# Patient Record
Sex: Female | Born: 1940 | Race: White | Hispanic: No | Marital: Married | Smoking: Former smoker
Health system: Southern US, Community
[De-identification: ages and names within clinical notes are randomized; demographics above are authoritative.]

---

## 1998-06-28 ENCOUNTER — Ambulatory Visit (HOSPITAL_BASED_OUTPATIENT_CLINIC_OR_DEPARTMENT_OTHER): Admission: RE | Admit: 1998-06-28 | Discharge: 1998-06-28 | Payer: Self-pay | Admitting: Plastic Surgery

## 1998-09-01 ENCOUNTER — Other Ambulatory Visit: Admission: RE | Admit: 1998-09-01 | Discharge: 1998-09-01 | Payer: Self-pay | Admitting: Internal Medicine

## 1999-04-07 ENCOUNTER — Ambulatory Visit (HOSPITAL_COMMUNITY): Admission: RE | Admit: 1999-04-07 | Discharge: 1999-04-07 | Payer: Self-pay | Admitting: *Deleted

## 2001-11-14 ENCOUNTER — Other Ambulatory Visit: Admission: RE | Admit: 2001-11-14 | Discharge: 2001-11-14 | Payer: Self-pay | Admitting: Internal Medicine

## 2004-09-08 ENCOUNTER — Ambulatory Visit (HOSPITAL_COMMUNITY): Admission: RE | Admit: 2004-09-08 | Discharge: 2004-09-08 | Payer: Self-pay | Admitting: Plastic Surgery

## 2004-09-08 ENCOUNTER — Encounter (INDEPENDENT_AMBULATORY_CARE_PROVIDER_SITE_OTHER): Payer: Self-pay | Admitting: *Deleted

## 2004-09-08 ENCOUNTER — Ambulatory Visit (HOSPITAL_BASED_OUTPATIENT_CLINIC_OR_DEPARTMENT_OTHER): Admission: RE | Admit: 2004-09-08 | Discharge: 2004-09-08 | Payer: Self-pay | Admitting: Plastic Surgery

## 2004-12-08 ENCOUNTER — Other Ambulatory Visit: Admission: RE | Admit: 2004-12-08 | Discharge: 2004-12-08 | Payer: Self-pay | Admitting: Internal Medicine

## 2007-10-17 ENCOUNTER — Other Ambulatory Visit: Admission: RE | Admit: 2007-10-17 | Discharge: 2007-10-17 | Payer: Self-pay | Admitting: Internal Medicine

## 2007-12-09 ENCOUNTER — Ambulatory Visit (HOSPITAL_COMMUNITY): Admission: RE | Admit: 2007-12-09 | Discharge: 2007-12-09 | Payer: Self-pay | Admitting: *Deleted

## 2007-12-09 ENCOUNTER — Encounter (INDEPENDENT_AMBULATORY_CARE_PROVIDER_SITE_OTHER): Payer: Self-pay | Admitting: *Deleted

## 2010-02-15 ENCOUNTER — Other Ambulatory Visit: Admission: RE | Admit: 2010-02-15 | Discharge: 2010-02-15 | Payer: Self-pay | Admitting: Internal Medicine

## 2011-03-21 NOTE — Op Note (Signed)
Molly Manning, Molly Manning               ACCOUNT NO.:  [id number]   MEDICAL RECORD NO.:  [id number]          PATIENT TYPE:  AMB   LOCATION:  ENDO                         FACILITY:  [facility]   PHYSICIAN:  Georgiana Spinner, M.D.    DATE OF BIRTH:  12-26-1940   DATE OF PROCEDURE:  DATE OF DISCHARGE:                               OPERATIVE REPORT   PROCEDURE:  Colonoscopy with biopsy.   INDICATIONS:  Colon polyps.   ANESTHESIA:  Demerol 100, Versed 4 mg.   DESCRIPTION OF PROCEDURE:  With the patient mildly sedated in the left  lateral decubitus position, the Pentax videoscopic colonoscope was  inserted in the rectum, passed under direct vision with pressure applied  to reach the cecum, identified by the ileocecal valve and appendiceal  orifice both of which were photographed. After clearing the cecum with  washing and suctioning some fecal material, the colonoscope was then  slowly withdrawn taking circumferential views of the colonic mucosa  stopping at approximately 20 cm from the anal verge at which point a  small polyp was seen, photographed and removed using hot biopsy forceps  technique setting of 20/150 blended current.  We next stopped in the  rectum which appeared normal on direct and showed small polyps on  retroflexed view that were removed using again hot biopsy forceps  technique with the same setting.  The endoscope was straightened and  withdrawn.  The patient's vital signs and pulse oximeter remained  stable.  The patient tolerated the procedure well without apparent  complications.   FINDINGS:  Polyps as described above at 20 cm from the anal verge near  the anal verge, await biopsy reports.  The patient will call me for  results and follow-up with me as needed as an outpatient.           ______________________________  Georgiana Spinner, M.D.     GMO/MEDQ  D:  12/09/2007  T:  12/09/2007  Job:  [id number]

## 2011-03-24 NOTE — Op Note (Signed)
NAMEPECOLA, HAXTON               ACCOUNT NO.:  [id number]   MEDICAL RECORD NO.:  [id number]          PATIENT TYPE:  AMB   LOCATION:  DSC                          FACILITY:  [facility]   PHYSICIAN:  Alfredia Ferguson, M.D.  DATE OF BIRTH:  [birth year]   DATE OF PROCEDURE:  09/08/2004  DATE OF DISCHARGE:                                 OPERATIVE REPORT   PREOPERATIVE DIAGNOSIS:  1.  Scaly lesion of the left posterior thigh, 6 mm.  2.  Scaly lesion of upper forehead, 6 mm.  3.  Irritated seborrheic keratosis, left upper arm.   POSTOPERATIVE DIAGNOSIS:  1.  Scaly lesion of the left posterior thigh, 6 mm.  2.  Scaly lesion of upper forehead, 6 mm.  3.  Irritated seborrheic keratosis, left upper arm.   OPERATION PERFORMED:  1.  Excision of scaly lesion, left eye.  2.  Excision scaly lesion upper forehead.  3.  Hyfrecation and curetting of scaly lesion left upper arm.   SURGEON:  Alfredia Ferguson, M.D.   ANESTHESIA:  2% Xylocaine with 1:100,000 epinephrine.   INDICATIONS FOR PROCEDURE:  This is a 70 year old woman who has lesions  which have been growing on her face, thigh, and arm.  The lesion on the leg  has been bleeding frequently.  She has a history of skin cancer and she  wishes to have all three lesions removed.  The patient understands what she  has for permanent and potentially unsightly scar.  In spite of that, she  wishes to proceed.   DESCRIPTION OF PROCEDURE:  Elliptical skin marks were placed around the  lesion on the forehead and the lesion on the left thigh.  The lesion on the  arm was simply anesthetized with 2% Xylocaine with 1:100,000 epinephrine as  were the other two lesions.  After waiting approximately five minutes, all  areas were prepped with Betadine and draped with sterile drapes.  The  forehead lesion was excised in an elliptical fashion and passed off for  pathology review.  Hemostasis was accomplished using pressure.  The wound  edges were undermined for a  distance of several mm in all directions.  The  forehead wound was closed with multiple interrupted 6-0 nylon sutures.  A  light dressing was applied.  The lesion on the left arm was now hyfrecated  and curetted.  The base was then hyfrecated to create a light char.  The  lesion on the left posterior thigh was prepped with Betadine and draped with  sterile drapes.  An elliptical excision of the lesion was carried out down  to the level of the subcutaneous tissue.  The  specimen was passed off for pathology.  The dermis was closed with  interrupted 5-0 Monocryl suture.  The skin edges were closed with Steri-  Strips.  The area was cleansed and dried, and a light dressing was applied.  The patient tolerated the procedure well and was discharged to home in  satisfactory condition.       WBB/MEDQ  D:  09/08/2004  T:  09/08/2004  Job:  [id number]

## 2011-11-30 DIAGNOSIS — Z1231 Encounter for screening mammogram for malignant neoplasm of breast: Secondary | ICD-10-CM | POA: Diagnosis not present

## 2011-12-01 DIAGNOSIS — I872 Venous insufficiency (chronic) (peripheral): Secondary | ICD-10-CM | POA: Diagnosis not present

## 2011-12-04 DIAGNOSIS — I8310 Varicose veins of unspecified lower extremity with inflammation: Secondary | ICD-10-CM | POA: Diagnosis not present

## 2011-12-13 DIAGNOSIS — I8310 Varicose veins of unspecified lower extremity with inflammation: Secondary | ICD-10-CM | POA: Diagnosis not present

## 2012-01-09 DIAGNOSIS — I8310 Varicose veins of unspecified lower extremity with inflammation: Secondary | ICD-10-CM | POA: Diagnosis not present

## 2012-01-12 DIAGNOSIS — I8310 Varicose veins of unspecified lower extremity with inflammation: Secondary | ICD-10-CM | POA: Diagnosis not present

## 2012-01-12 DIAGNOSIS — M79609 Pain in unspecified limb: Secondary | ICD-10-CM | POA: Diagnosis not present

## 2012-01-23 DIAGNOSIS — M79609 Pain in unspecified limb: Secondary | ICD-10-CM | POA: Diagnosis not present

## 2012-01-23 DIAGNOSIS — I8310 Varicose veins of unspecified lower extremity with inflammation: Secondary | ICD-10-CM | POA: Diagnosis not present

## 2012-02-06 DIAGNOSIS — M79609 Pain in unspecified limb: Secondary | ICD-10-CM | POA: Diagnosis not present

## 2012-02-06 DIAGNOSIS — M7981 Nontraumatic hematoma of soft tissue: Secondary | ICD-10-CM | POA: Diagnosis not present

## 2012-02-06 DIAGNOSIS — I8310 Varicose veins of unspecified lower extremity with inflammation: Secondary | ICD-10-CM | POA: Diagnosis not present

## 2012-02-20 DIAGNOSIS — I8310 Varicose veins of unspecified lower extremity with inflammation: Secondary | ICD-10-CM | POA: Diagnosis not present

## 2012-02-20 DIAGNOSIS — M79609 Pain in unspecified limb: Secondary | ICD-10-CM | POA: Diagnosis not present

## 2012-02-28 DIAGNOSIS — Z79899 Other long term (current) drug therapy: Secondary | ICD-10-CM | POA: Diagnosis not present

## 2012-02-28 DIAGNOSIS — E7800 Pure hypercholesterolemia, unspecified: Secondary | ICD-10-CM | POA: Diagnosis not present

## 2012-02-28 DIAGNOSIS — Z7902 Long term (current) use of antithrombotics/antiplatelets: Secondary | ICD-10-CM | POA: Diagnosis not present

## 2012-03-05 DIAGNOSIS — L570 Actinic keratosis: Secondary | ICD-10-CM | POA: Diagnosis not present

## 2012-03-05 DIAGNOSIS — D485 Neoplasm of uncertain behavior of skin: Secondary | ICD-10-CM | POA: Diagnosis not present

## 2012-03-06 DIAGNOSIS — E7800 Pure hypercholesterolemia, unspecified: Secondary | ICD-10-CM | POA: Diagnosis not present

## 2012-03-06 DIAGNOSIS — H9319 Tinnitus, unspecified ear: Secondary | ICD-10-CM | POA: Diagnosis not present

## 2012-04-10 DIAGNOSIS — I8310 Varicose veins of unspecified lower extremity with inflammation: Secondary | ICD-10-CM | POA: Diagnosis not present

## 2012-04-10 DIAGNOSIS — M79609 Pain in unspecified limb: Secondary | ICD-10-CM | POA: Diagnosis not present

## 2012-07-30 ENCOUNTER — Ambulatory Visit (INDEPENDENT_AMBULATORY_CARE_PROVIDER_SITE_OTHER): Payer: Medicare Other | Admitting: *Deleted

## 2012-07-30 DIAGNOSIS — Z23 Encounter for immunization: Secondary | ICD-10-CM | POA: Diagnosis not present

## 2012-07-31 DIAGNOSIS — M79609 Pain in unspecified limb: Secondary | ICD-10-CM | POA: Diagnosis not present

## 2012-08-16 DIAGNOSIS — I872 Venous insufficiency (chronic) (peripheral): Secondary | ICD-10-CM | POA: Diagnosis not present

## 2012-09-05 DIAGNOSIS — E7800 Pure hypercholesterolemia, unspecified: Secondary | ICD-10-CM | POA: Diagnosis not present

## 2012-09-05 DIAGNOSIS — Z7902 Long term (current) use of antithrombotics/antiplatelets: Secondary | ICD-10-CM | POA: Diagnosis not present

## 2012-09-05 DIAGNOSIS — Z79899 Other long term (current) drug therapy: Secondary | ICD-10-CM | POA: Diagnosis not present

## 2012-09-11 ENCOUNTER — Other Ambulatory Visit: Payer: Self-pay | Admitting: Registered Nurse

## 2012-09-11 ENCOUNTER — Other Ambulatory Visit (HOSPITAL_COMMUNITY)
Admission: RE | Admit: 2012-09-11 | Discharge: 2012-09-11 | Disposition: A | Discharge status: Home / Self Care | Payer: Medicare Other | Source: Ambulatory Visit | Attending: Endocrinology | Admitting: Endocrinology

## 2012-09-11 DIAGNOSIS — Z1212 Encounter for screening for malignant neoplasm of rectum: Secondary | ICD-10-CM | POA: Diagnosis not present

## 2012-09-11 DIAGNOSIS — Z124 Encounter for screening for malignant neoplasm of cervix: Secondary | ICD-10-CM | POA: Diagnosis not present

## 2012-09-11 DIAGNOSIS — Z01419 Encounter for gynecological examination (general) (routine) without abnormal findings: Secondary | ICD-10-CM | POA: Diagnosis not present

## 2012-09-11 DIAGNOSIS — E7800 Pure hypercholesterolemia, unspecified: Secondary | ICD-10-CM | POA: Diagnosis not present

## 2012-11-19 DIAGNOSIS — I872 Venous insufficiency (chronic) (peripheral): Secondary | ICD-10-CM | POA: Diagnosis not present

## 2012-12-02 DIAGNOSIS — Z1231 Encounter for screening mammogram for malignant neoplasm of breast: Secondary | ICD-10-CM | POA: Diagnosis not present

## 2012-12-04 DIAGNOSIS — I872 Venous insufficiency (chronic) (peripheral): Secondary | ICD-10-CM | POA: Diagnosis not present

## 2013-03-13 DIAGNOSIS — Z79899 Other long term (current) drug therapy: Secondary | ICD-10-CM | POA: Diagnosis not present

## 2013-03-13 DIAGNOSIS — E7800 Pure hypercholesterolemia, unspecified: Secondary | ICD-10-CM | POA: Diagnosis not present

## 2013-03-13 DIAGNOSIS — Z0000 Encounter for general adult medical examination without abnormal findings: Secondary | ICD-10-CM | POA: Diagnosis not present

## 2013-03-18 DIAGNOSIS — Z87891 Personal history of nicotine dependence: Secondary | ICD-10-CM | POA: Diagnosis not present

## 2013-03-18 DIAGNOSIS — E7800 Pure hypercholesterolemia, unspecified: Secondary | ICD-10-CM | POA: Diagnosis not present

## 2013-03-18 DIAGNOSIS — Z800 Family history of malignant neoplasm of digestive organs: Secondary | ICD-10-CM | POA: Diagnosis not present

## 2013-03-18 DIAGNOSIS — Z7982 Long term (current) use of aspirin: Secondary | ICD-10-CM | POA: Diagnosis not present

## 2013-06-10 DIAGNOSIS — H43819 Vitreous degeneration, unspecified eye: Secondary | ICD-10-CM | POA: Diagnosis not present

## 2013-07-18 ENCOUNTER — Ambulatory Visit (INDEPENDENT_AMBULATORY_CARE_PROVIDER_SITE_OTHER): Payer: Medicare Other

## 2013-07-18 DIAGNOSIS — Z23 Encounter for immunization: Secondary | ICD-10-CM

## 2013-07-22 DIAGNOSIS — I8310 Varicose veins of unspecified lower extremity with inflammation: Secondary | ICD-10-CM | POA: Diagnosis not present

## 2013-07-22 DIAGNOSIS — M79609 Pain in unspecified limb: Secondary | ICD-10-CM | POA: Diagnosis not present

## 2013-08-28 DIAGNOSIS — E2839 Other primary ovarian failure: Secondary | ICD-10-CM | POA: Diagnosis not present

## 2013-12-03 DIAGNOSIS — Z1231 Encounter for screening mammogram for malignant neoplasm of breast: Secondary | ICD-10-CM | POA: Diagnosis not present

## 2014-03-26 DIAGNOSIS — Z7982 Long term (current) use of aspirin: Secondary | ICD-10-CM | POA: Diagnosis not present

## 2014-03-26 DIAGNOSIS — Z8249 Family history of ischemic heart disease and other diseases of the circulatory system: Secondary | ICD-10-CM | POA: Diagnosis not present

## 2014-03-26 DIAGNOSIS — T887XXA Unspecified adverse effect of drug or medicament, initial encounter: Secondary | ICD-10-CM | POA: Diagnosis not present

## 2014-03-26 DIAGNOSIS — Z23 Encounter for immunization: Secondary | ICD-10-CM | POA: Diagnosis not present

## 2014-03-26 DIAGNOSIS — E7800 Pure hypercholesterolemia, unspecified: Secondary | ICD-10-CM | POA: Diagnosis not present

## 2014-03-26 DIAGNOSIS — Z0000 Encounter for general adult medical examination without abnormal findings: Secondary | ICD-10-CM | POA: Diagnosis not present

## 2014-03-31 DIAGNOSIS — H9319 Tinnitus, unspecified ear: Secondary | ICD-10-CM | POA: Diagnosis not present

## 2014-03-31 DIAGNOSIS — I6529 Occlusion and stenosis of unspecified carotid artery: Secondary | ICD-10-CM | POA: Diagnosis not present

## 2014-03-31 DIAGNOSIS — Z7982 Long term (current) use of aspirin: Secondary | ICD-10-CM | POA: Diagnosis not present

## 2014-03-31 DIAGNOSIS — E7800 Pure hypercholesterolemia, unspecified: Secondary | ICD-10-CM | POA: Diagnosis not present

## 2014-08-12 ENCOUNTER — Ambulatory Visit (INDEPENDENT_AMBULATORY_CARE_PROVIDER_SITE_OTHER): Payer: Medicare Other | Admitting: Radiology

## 2014-08-12 DIAGNOSIS — Z23 Encounter for immunization: Secondary | ICD-10-CM | POA: Diagnosis not present

## 2014-08-25 DIAGNOSIS — H2513 Age-related nuclear cataract, bilateral: Secondary | ICD-10-CM | POA: Diagnosis not present

## 2014-12-07 DIAGNOSIS — Z1231 Encounter for screening mammogram for malignant neoplasm of breast: Secondary | ICD-10-CM | POA: Diagnosis not present

## 2015-04-09 DIAGNOSIS — Z79899 Other long term (current) drug therapy: Secondary | ICD-10-CM | POA: Diagnosis not present

## 2015-04-09 DIAGNOSIS — E780 Pure hypercholesterolemia: Secondary | ICD-10-CM | POA: Diagnosis not present

## 2015-04-13 DIAGNOSIS — Z0000 Encounter for general adult medical examination without abnormal findings: Secondary | ICD-10-CM | POA: Diagnosis not present

## 2015-04-16 DIAGNOSIS — M810 Age-related osteoporosis without current pathological fracture: Secondary | ICD-10-CM | POA: Diagnosis not present

## 2015-04-20 DIAGNOSIS — R312 Other microscopic hematuria: Secondary | ICD-10-CM | POA: Diagnosis not present

## 2015-04-20 DIAGNOSIS — E780 Pure hypercholesterolemia: Secondary | ICD-10-CM | POA: Diagnosis not present

## 2015-04-26 DIAGNOSIS — Z780 Asymptomatic menopausal state: Secondary | ICD-10-CM | POA: Diagnosis not present

## 2015-04-26 DIAGNOSIS — Z1212 Encounter for screening for malignant neoplasm of rectum: Secondary | ICD-10-CM | POA: Diagnosis not present

## 2015-04-26 DIAGNOSIS — Z01419 Encounter for gynecological examination (general) (routine) without abnormal findings: Secondary | ICD-10-CM | POA: Diagnosis not present

## 2015-07-05 DIAGNOSIS — N210 Calculus in bladder: Secondary | ICD-10-CM | POA: Diagnosis not present

## 2015-07-05 DIAGNOSIS — R312 Other microscopic hematuria: Secondary | ICD-10-CM | POA: Diagnosis not present

## 2015-07-16 DIAGNOSIS — K8020 Calculus of gallbladder without cholecystitis without obstruction: Secondary | ICD-10-CM | POA: Diagnosis not present

## 2015-07-16 DIAGNOSIS — R312 Other microscopic hematuria: Secondary | ICD-10-CM | POA: Diagnosis not present

## 2015-08-04 ENCOUNTER — Other Ambulatory Visit: Payer: Self-pay | Admitting: Gastroenterology

## 2015-08-04 DIAGNOSIS — D127 Benign neoplasm of rectosigmoid junction: Secondary | ICD-10-CM | POA: Diagnosis not present

## 2015-08-04 DIAGNOSIS — Z86010 Personal history of colonic polyps: Secondary | ICD-10-CM | POA: Diagnosis not present

## 2015-08-04 DIAGNOSIS — K5730 Diverticulosis of large intestine without perforation or abscess without bleeding: Secondary | ICD-10-CM | POA: Diagnosis not present

## 2015-08-04 DIAGNOSIS — K640 First degree hemorrhoids: Secondary | ICD-10-CM | POA: Diagnosis not present

## 2015-08-04 DIAGNOSIS — K635 Polyp of colon: Secondary | ICD-10-CM | POA: Diagnosis not present

## 2015-08-04 DIAGNOSIS — Z09 Encounter for follow-up examination after completed treatment for conditions other than malignant neoplasm: Secondary | ICD-10-CM | POA: Diagnosis not present

## 2015-08-10 ENCOUNTER — Ambulatory Visit (INDEPENDENT_AMBULATORY_CARE_PROVIDER_SITE_OTHER): Payer: Medicare Other

## 2015-08-10 DIAGNOSIS — Z23 Encounter for immunization: Secondary | ICD-10-CM

## 2015-12-09 DIAGNOSIS — Z1231 Encounter for screening mammogram for malignant neoplasm of breast: Secondary | ICD-10-CM | POA: Diagnosis not present

## 2016-01-17 DIAGNOSIS — R911 Solitary pulmonary nodule: Secondary | ICD-10-CM | POA: Diagnosis not present

## 2016-04-17 DIAGNOSIS — Z0000 Encounter for general adult medical examination without abnormal findings: Secondary | ICD-10-CM | POA: Diagnosis not present

## 2016-04-17 DIAGNOSIS — E7800 Pure hypercholesterolemia, unspecified: Secondary | ICD-10-CM | POA: Diagnosis not present

## 2016-04-24 DIAGNOSIS — I6529 Occlusion and stenosis of unspecified carotid artery: Secondary | ICD-10-CM | POA: Diagnosis not present

## 2016-04-24 DIAGNOSIS — E3120 Multiple endocrine neoplasia [MEN] syndrome, unspecified: Secondary | ICD-10-CM | POA: Diagnosis not present

## 2016-04-24 DIAGNOSIS — L570 Actinic keratosis: Secondary | ICD-10-CM | POA: Diagnosis not present

## 2016-04-24 DIAGNOSIS — R918 Other nonspecific abnormal finding of lung field: Secondary | ICD-10-CM | POA: Diagnosis not present

## 2016-04-24 DIAGNOSIS — E7800 Pure hypercholesterolemia, unspecified: Secondary | ICD-10-CM | POA: Diagnosis not present

## 2016-04-24 DIAGNOSIS — K449 Diaphragmatic hernia without obstruction or gangrene: Secondary | ICD-10-CM | POA: Diagnosis not present

## 2016-04-24 DIAGNOSIS — M8580 Other specified disorders of bone density and structure, unspecified site: Secondary | ICD-10-CM | POA: Diagnosis not present

## 2016-04-24 DIAGNOSIS — E559 Vitamin D deficiency, unspecified: Secondary | ICD-10-CM | POA: Diagnosis not present

## 2016-04-24 DIAGNOSIS — M859 Disorder of bone density and structure, unspecified: Secondary | ICD-10-CM | POA: Diagnosis not present

## 2016-04-24 DIAGNOSIS — K8020 Calculus of gallbladder without cholecystitis without obstruction: Secondary | ICD-10-CM | POA: Diagnosis not present

## 2016-05-01 DIAGNOSIS — Z01419 Encounter for gynecological examination (general) (routine) without abnormal findings: Secondary | ICD-10-CM | POA: Diagnosis not present

## 2016-05-01 DIAGNOSIS — Z780 Asymptomatic menopausal state: Secondary | ICD-10-CM | POA: Diagnosis not present

## 2016-05-01 DIAGNOSIS — Z1212 Encounter for screening for malignant neoplasm of rectum: Secondary | ICD-10-CM | POA: Diagnosis not present

## 2016-07-06 ENCOUNTER — Other Ambulatory Visit: Payer: Self-pay

## 2016-08-07 ENCOUNTER — Ambulatory Visit (INDEPENDENT_AMBULATORY_CARE_PROVIDER_SITE_OTHER): Payer: Medicare Other | Admitting: Physician Assistant

## 2016-08-07 DIAGNOSIS — Z23 Encounter for immunization: Secondary | ICD-10-CM

## 2016-08-07 NOTE — Progress Notes (Signed)
Patient presented here today for flu shot only. Jp/cma

## 2016-12-13 DIAGNOSIS — Z1231 Encounter for screening mammogram for malignant neoplasm of breast: Secondary | ICD-10-CM | POA: Diagnosis not present

## 2017-04-25 DIAGNOSIS — Z0000 Encounter for general adult medical examination without abnormal findings: Secondary | ICD-10-CM | POA: Diagnosis not present

## 2017-04-25 DIAGNOSIS — Z7982 Long term (current) use of aspirin: Secondary | ICD-10-CM | POA: Diagnosis not present

## 2017-04-25 DIAGNOSIS — M859 Disorder of bone density and structure, unspecified: Secondary | ICD-10-CM | POA: Diagnosis not present

## 2017-04-25 DIAGNOSIS — E7800 Pure hypercholesterolemia, unspecified: Secondary | ICD-10-CM | POA: Diagnosis not present

## 2017-05-02 DIAGNOSIS — Z7982 Long term (current) use of aspirin: Secondary | ICD-10-CM | POA: Diagnosis not present

## 2017-05-02 DIAGNOSIS — Z820 Family history of epilepsy and other diseases of the nervous system: Secondary | ICD-10-CM | POA: Diagnosis not present

## 2017-05-02 DIAGNOSIS — E7800 Pure hypercholesterolemia, unspecified: Secondary | ICD-10-CM | POA: Diagnosis not present

## 2017-05-02 DIAGNOSIS — Z87891 Personal history of nicotine dependence: Secondary | ICD-10-CM | POA: Diagnosis not present

## 2017-05-02 DIAGNOSIS — R3129 Other microscopic hematuria: Secondary | ICD-10-CM | POA: Diagnosis not present

## 2017-05-03 DIAGNOSIS — R3129 Other microscopic hematuria: Secondary | ICD-10-CM | POA: Diagnosis not present

## 2017-05-14 DIAGNOSIS — Z780 Asymptomatic menopausal state: Secondary | ICD-10-CM | POA: Diagnosis not present

## 2017-05-14 DIAGNOSIS — Z01419 Encounter for gynecological examination (general) (routine) without abnormal findings: Secondary | ICD-10-CM | POA: Diagnosis not present

## 2017-05-14 DIAGNOSIS — Z1212 Encounter for screening for malignant neoplasm of rectum: Secondary | ICD-10-CM | POA: Diagnosis not present

## 2017-08-22 ENCOUNTER — Ambulatory Visit (INDEPENDENT_AMBULATORY_CARE_PROVIDER_SITE_OTHER): Payer: Medicare Other | Admitting: Family Medicine

## 2017-08-22 DIAGNOSIS — Z23 Encounter for immunization: Secondary | ICD-10-CM

## 2017-09-02 NOTE — Progress Notes (Signed)
Immunization encounter only; flu vaccine.

## 2017-12-14 DIAGNOSIS — Z1231 Encounter for screening mammogram for malignant neoplasm of breast: Secondary | ICD-10-CM | POA: Diagnosis not present

## 2017-12-18 DIAGNOSIS — H3562 Retinal hemorrhage, left eye: Secondary | ICD-10-CM | POA: Diagnosis not present

## 2017-12-18 DIAGNOSIS — H2513 Age-related nuclear cataract, bilateral: Secondary | ICD-10-CM | POA: Diagnosis not present

## 2017-12-18 DIAGNOSIS — H4313 Vitreous hemorrhage, bilateral: Secondary | ICD-10-CM | POA: Diagnosis not present

## 2018-04-03 ENCOUNTER — Encounter: Payer: Self-pay | Admitting: Family Medicine

## 2018-04-04 DIAGNOSIS — H3562 Retinal hemorrhage, left eye: Secondary | ICD-10-CM | POA: Diagnosis not present

## 2018-05-13 DIAGNOSIS — N390 Urinary tract infection, site not specified: Secondary | ICD-10-CM | POA: Diagnosis not present

## 2018-05-13 DIAGNOSIS — R319 Hematuria, unspecified: Secondary | ICD-10-CM | POA: Diagnosis not present

## 2018-05-13 DIAGNOSIS — E7800 Pure hypercholesterolemia, unspecified: Secondary | ICD-10-CM | POA: Diagnosis not present

## 2018-05-13 DIAGNOSIS — M859 Disorder of bone density and structure, unspecified: Secondary | ICD-10-CM | POA: Diagnosis not present

## 2018-05-13 DIAGNOSIS — Z7982 Long term (current) use of aspirin: Secondary | ICD-10-CM | POA: Diagnosis not present

## 2018-05-15 DIAGNOSIS — N210 Calculus in bladder: Secondary | ICD-10-CM | POA: Diagnosis not present

## 2018-05-15 DIAGNOSIS — R319 Hematuria, unspecified: Secondary | ICD-10-CM | POA: Diagnosis not present

## 2018-05-15 DIAGNOSIS — R918 Other nonspecific abnormal finding of lung field: Secondary | ICD-10-CM | POA: Diagnosis not present

## 2018-05-15 DIAGNOSIS — K449 Diaphragmatic hernia without obstruction or gangrene: Secondary | ICD-10-CM | POA: Diagnosis not present

## 2018-05-15 DIAGNOSIS — Z0000 Encounter for general adult medical examination without abnormal findings: Secondary | ICD-10-CM | POA: Diagnosis not present

## 2018-05-15 DIAGNOSIS — E7800 Pure hypercholesterolemia, unspecified: Secondary | ICD-10-CM | POA: Diagnosis not present

## 2018-05-15 DIAGNOSIS — L409 Psoriasis, unspecified: Secondary | ICD-10-CM | POA: Diagnosis not present

## 2018-05-15 DIAGNOSIS — K8020 Calculus of gallbladder without cholecystitis without obstruction: Secondary | ICD-10-CM | POA: Diagnosis not present

## 2018-05-15 DIAGNOSIS — I6529 Occlusion and stenosis of unspecified carotid artery: Secondary | ICD-10-CM | POA: Diagnosis not present

## 2018-05-15 DIAGNOSIS — R3129 Other microscopic hematuria: Secondary | ICD-10-CM | POA: Diagnosis not present

## 2018-05-15 DIAGNOSIS — I2510 Atherosclerotic heart disease of native coronary artery without angina pectoris: Secondary | ICD-10-CM | POA: Diagnosis not present

## 2018-05-20 ENCOUNTER — Other Ambulatory Visit: Payer: Self-pay | Admitting: Internal Medicine

## 2018-05-20 DIAGNOSIS — Z1212 Encounter for screening for malignant neoplasm of rectum: Secondary | ICD-10-CM | POA: Diagnosis not present

## 2018-05-20 DIAGNOSIS — R918 Other nonspecific abnormal finding of lung field: Secondary | ICD-10-CM

## 2018-05-20 DIAGNOSIS — M8580 Other specified disorders of bone density and structure, unspecified site: Secondary | ICD-10-CM | POA: Diagnosis not present

## 2018-05-20 DIAGNOSIS — M859 Disorder of bone density and structure, unspecified: Secondary | ICD-10-CM | POA: Diagnosis not present

## 2018-05-20 DIAGNOSIS — Z780 Asymptomatic menopausal state: Secondary | ICD-10-CM | POA: Diagnosis not present

## 2018-05-20 DIAGNOSIS — Z01419 Encounter for gynecological examination (general) (routine) without abnormal findings: Secondary | ICD-10-CM | POA: Diagnosis not present

## 2018-05-20 DIAGNOSIS — Z23 Encounter for immunization: Secondary | ICD-10-CM | POA: Diagnosis not present

## 2018-05-23 ENCOUNTER — Ambulatory Visit
Admission: RE | Admit: 2018-05-23 | Discharge: 2018-05-23 | Disposition: A | Discharge status: Home / Self Care | Payer: Medicare Other | Source: Ambulatory Visit | Attending: Internal Medicine | Admitting: Internal Medicine

## 2018-05-23 DIAGNOSIS — R918 Other nonspecific abnormal finding of lung field: Secondary | ICD-10-CM

## 2018-07-18 DIAGNOSIS — Z23 Encounter for immunization: Secondary | ICD-10-CM | POA: Diagnosis not present

## 2018-08-02 IMAGING — CT CT CHEST W/O CM
1 of 2 series · 14 of 32 positions shown, 18 images · non-contrast
Comparison: 01/17/2016

CLINICAL DATA: Follow-up pulmonary nodule

EXAM:
CT CHEST WITHOUT CONTRAST
TECHNIQUE: Multidetector CT imaging of the chest was performed following the
standard protocol without IV contrast.

[Series 4: sag · axial · 0.70mm/px · z∈[-315,-53]mm · 14 of 155 slices shown, 18 images]
[im 12/155  mediastinal]
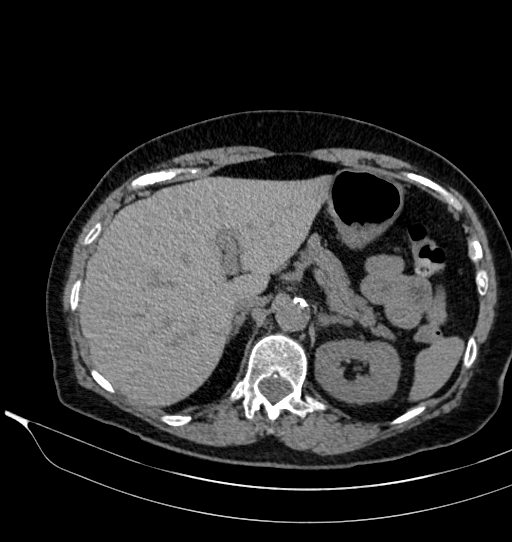
[im 12/155  lung]
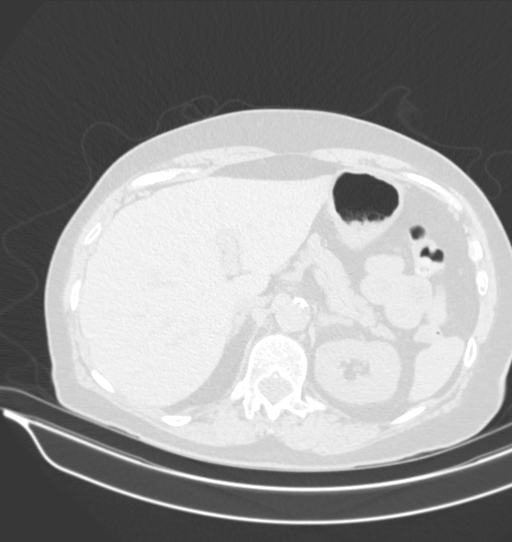
[im 24/155  lung]
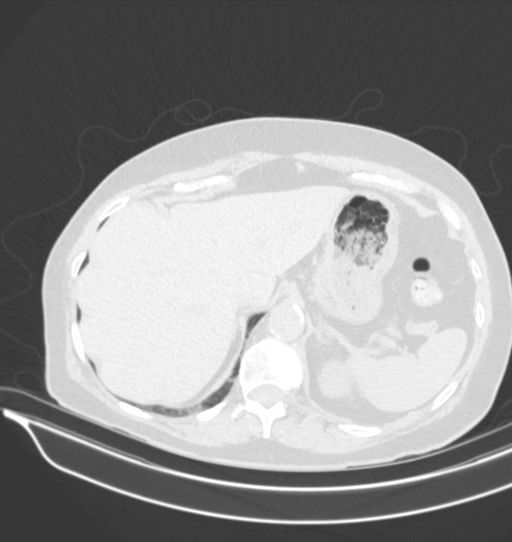
[im 36/155  lung]
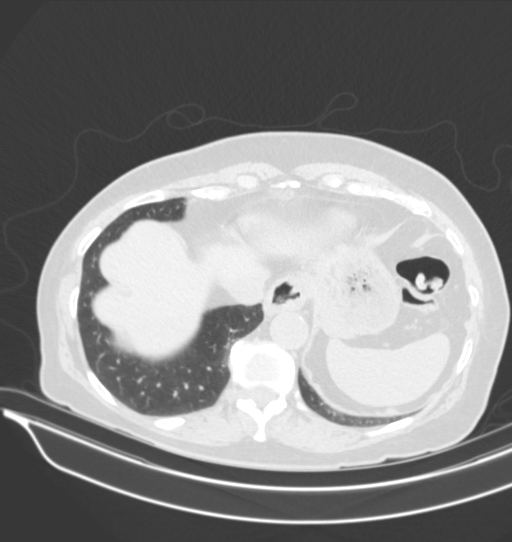
[im 48/155  lung]
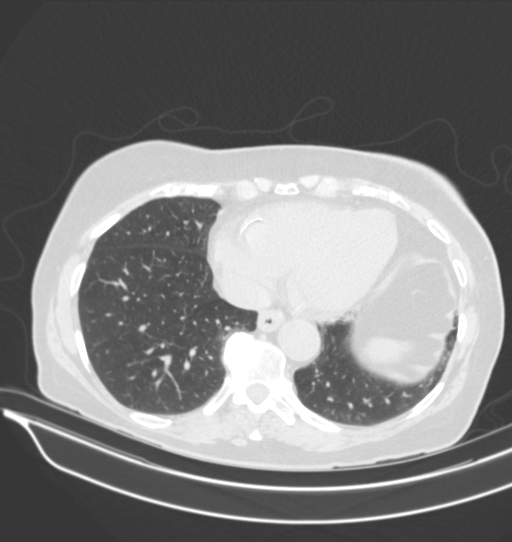
[im 60/155  mediastinal]
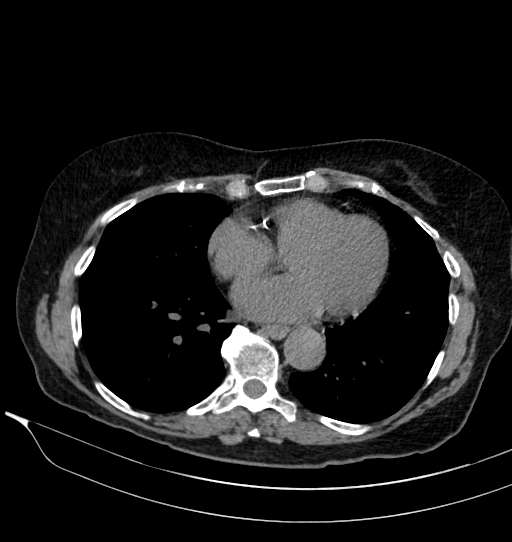
[im 60/155  lung]
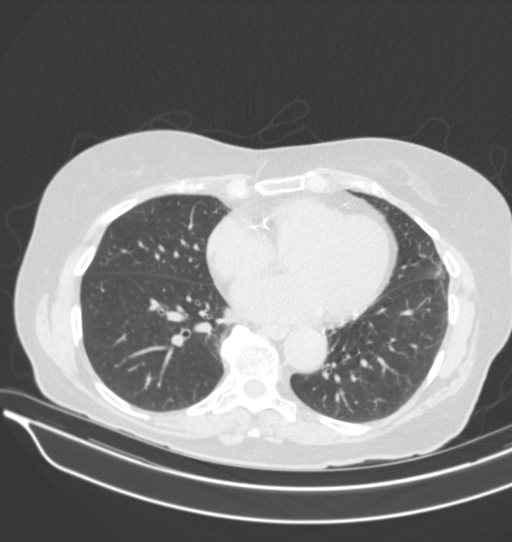
[im 72/155  lung]
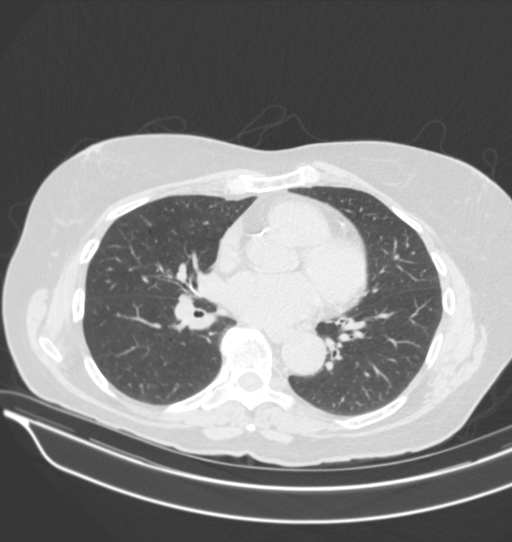
[im 78/155  lung]
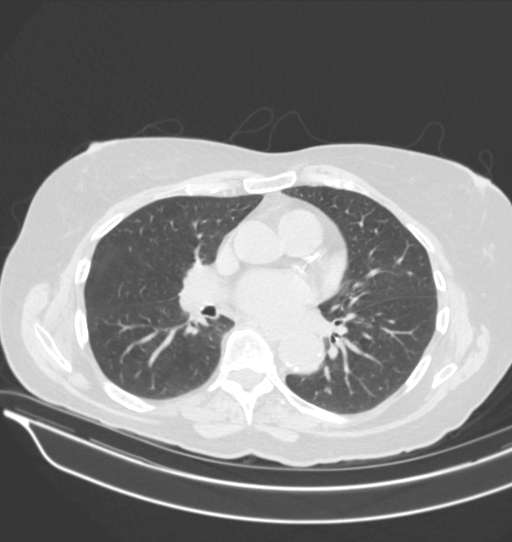
[im 82/155  lung]
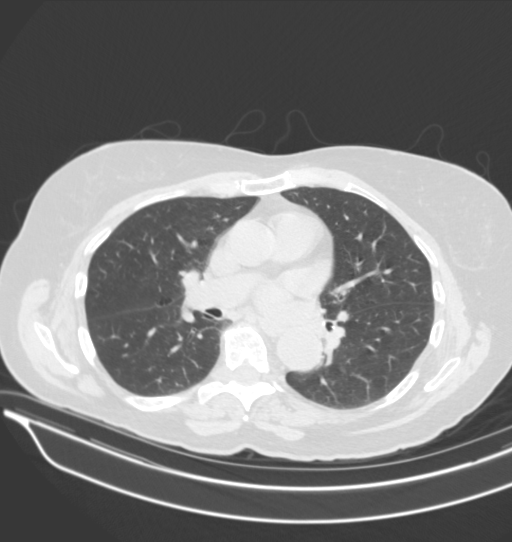
[im 83/155  mediastinal]
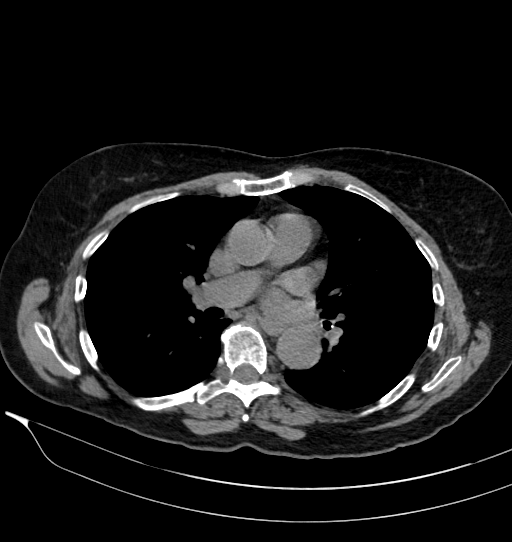
[im 83/155  lung]
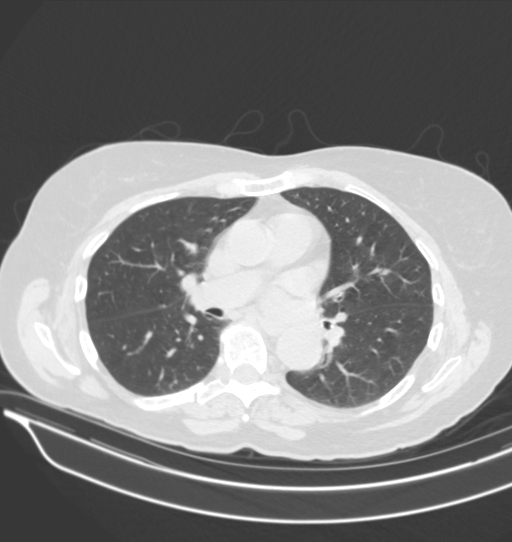
[im 95/155  lung]
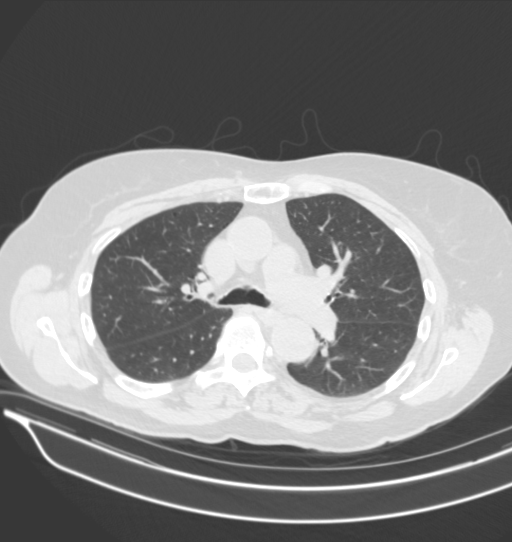
[im 107/155  lung]
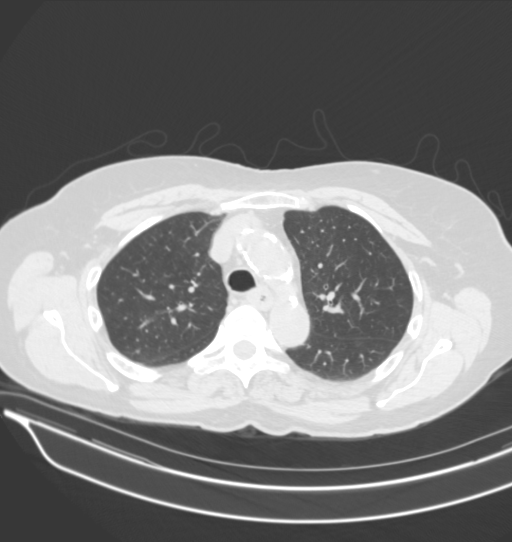
[im 119/155  lung]
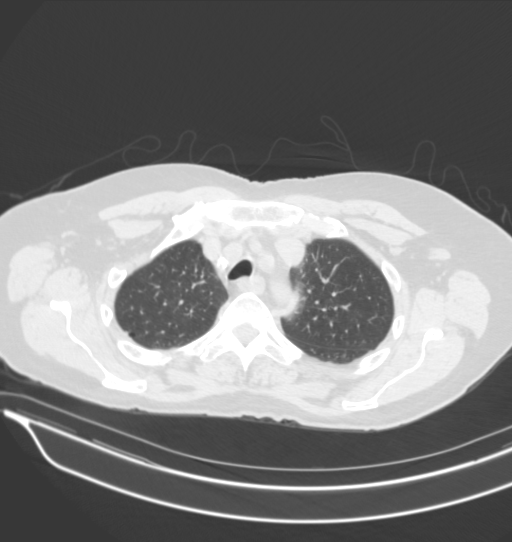
[im 131/155  mediastinal]
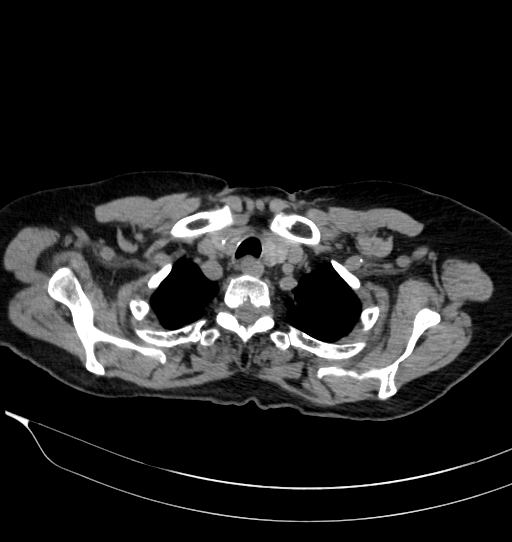
[im 131/155  lung]
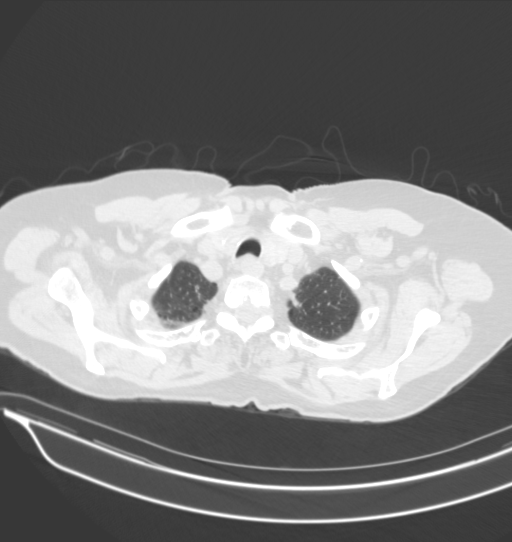
[im 143/155  lung]
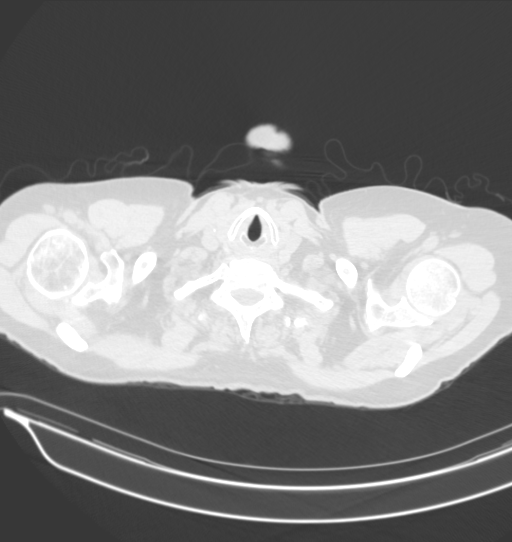

[14 of 32 positions shown; findings below may reference images not displayed]

FINDINGS: Cardiovascular: Diffuse coronary artery calcifications. Heart is
mildly enlarged. Ectasia of the thoracic aorta with scattered
calcifications. No aneurysm.

Mediastinum/Nodes: No mediastinal, hilar, or axillary adenopathy.

Lungs/Pleura: Biapical scarring, stable. Mild emphysema in the
apices. 6 mm nodule in the right lower lobe is stable since prior
study. 2-3 mm nodule in the left lower lobe on image 92 is stable.
No new or enlarging pulmonary nodules. No effusions.

Upper Abdomen: Imaging into the upper abdomen shows no acute
findings.

Musculoskeletal: Chest wall soft tissues are unremarkable. No acute
bony abnormality.
IMPRESSION: Stable bilateral lower lobe pulmonary nodules, the largest 6 mm in
the right lower lobe. These have been stable for greater than 2
years and are compatible with benign nodules.

Diffuse coronary artery disease.

Aortic Atherosclerosis (F4YFH-5AR.R) and Emphysema (F4YFH-64J.F).

## 2018-09-09 DIAGNOSIS — Z23 Encounter for immunization: Secondary | ICD-10-CM | POA: Diagnosis not present

## 2018-12-23 DIAGNOSIS — Z1231 Encounter for screening mammogram for malignant neoplasm of breast: Secondary | ICD-10-CM | POA: Diagnosis not present

## 2019-05-19 DIAGNOSIS — Z7982 Long term (current) use of aspirin: Secondary | ICD-10-CM | POA: Diagnosis not present

## 2019-05-19 DIAGNOSIS — E7800 Pure hypercholesterolemia, unspecified: Secondary | ICD-10-CM | POA: Diagnosis not present

## 2019-05-22 DIAGNOSIS — R350 Frequency of micturition: Secondary | ICD-10-CM | POA: Diagnosis not present

## 2019-05-22 DIAGNOSIS — R319 Hematuria, unspecified: Secondary | ICD-10-CM | POA: Diagnosis not present

## 2019-05-22 DIAGNOSIS — I6529 Occlusion and stenosis of unspecified carotid artery: Secondary | ICD-10-CM | POA: Diagnosis not present

## 2019-05-22 DIAGNOSIS — Z7982 Long term (current) use of aspirin: Secondary | ICD-10-CM | POA: Diagnosis not present

## 2019-05-22 DIAGNOSIS — Z0000 Encounter for general adult medical examination without abnormal findings: Secondary | ICD-10-CM | POA: Diagnosis not present

## 2019-05-22 DIAGNOSIS — I2510 Atherosclerotic heart disease of native coronary artery without angina pectoris: Secondary | ICD-10-CM | POA: Diagnosis not present

## 2019-05-26 DIAGNOSIS — E7800 Pure hypercholesterolemia, unspecified: Secondary | ICD-10-CM | POA: Diagnosis not present

## 2019-05-26 DIAGNOSIS — Z1212 Encounter for screening for malignant neoplasm of rectum: Secondary | ICD-10-CM | POA: Diagnosis not present

## 2019-05-26 DIAGNOSIS — Z01419 Encounter for gynecological examination (general) (routine) without abnormal findings: Secondary | ICD-10-CM | POA: Diagnosis not present

## 2019-07-11 DIAGNOSIS — Z23 Encounter for immunization: Secondary | ICD-10-CM | POA: Diagnosis not present

## 2019-12-29 DIAGNOSIS — Z1231 Encounter for screening mammogram for malignant neoplasm of breast: Secondary | ICD-10-CM | POA: Diagnosis not present

## 2020-05-24 DIAGNOSIS — Z1212 Encounter for screening for malignant neoplasm of rectum: Secondary | ICD-10-CM | POA: Diagnosis not present

## 2020-05-24 DIAGNOSIS — E7800 Pure hypercholesterolemia, unspecified: Secondary | ICD-10-CM | POA: Diagnosis not present

## 2020-05-24 DIAGNOSIS — Z7982 Long term (current) use of aspirin: Secondary | ICD-10-CM | POA: Diagnosis not present

## 2020-05-24 DIAGNOSIS — Z01419 Encounter for gynecological examination (general) (routine) without abnormal findings: Secondary | ICD-10-CM | POA: Diagnosis not present

## 2020-05-26 DIAGNOSIS — R918 Other nonspecific abnormal finding of lung field: Secondary | ICD-10-CM | POA: Diagnosis not present

## 2020-05-26 DIAGNOSIS — I6529 Occlusion and stenosis of unspecified carotid artery: Secondary | ICD-10-CM | POA: Diagnosis not present

## 2020-05-26 DIAGNOSIS — Z820 Family history of epilepsy and other diseases of the nervous system: Secondary | ICD-10-CM | POA: Diagnosis not present

## 2020-05-26 DIAGNOSIS — Z0000 Encounter for general adult medical examination without abnormal findings: Secondary | ICD-10-CM | POA: Diagnosis not present

## 2020-05-26 DIAGNOSIS — I2510 Atherosclerotic heart disease of native coronary artery without angina pectoris: Secondary | ICD-10-CM | POA: Diagnosis not present

## 2020-05-26 DIAGNOSIS — M810 Age-related osteoporosis without current pathological fracture: Secondary | ICD-10-CM | POA: Diagnosis not present

## 2020-05-26 DIAGNOSIS — E7800 Pure hypercholesterolemia, unspecified: Secondary | ICD-10-CM | POA: Diagnosis not present

## 2020-05-26 DIAGNOSIS — Z7982 Long term (current) use of aspirin: Secondary | ICD-10-CM | POA: Diagnosis not present

## 2020-05-26 DIAGNOSIS — M8589 Other specified disorders of bone density and structure, multiple sites: Secondary | ICD-10-CM | POA: Diagnosis not present

## 2020-06-16 DIAGNOSIS — Z1212 Encounter for screening for malignant neoplasm of rectum: Secondary | ICD-10-CM | POA: Diagnosis not present

## 2020-06-16 DIAGNOSIS — Z1211 Encounter for screening for malignant neoplasm of colon: Secondary | ICD-10-CM | POA: Diagnosis not present

## 2020-08-01 DIAGNOSIS — Z23 Encounter for immunization: Secondary | ICD-10-CM | POA: Diagnosis not present

## 2020-08-09 DIAGNOSIS — Z23 Encounter for immunization: Secondary | ICD-10-CM | POA: Diagnosis not present

## 2020-08-12 DIAGNOSIS — R195 Other fecal abnormalities: Secondary | ICD-10-CM | POA: Diagnosis not present

## 2020-09-13 DIAGNOSIS — Z1159 Encounter for screening for other viral diseases: Secondary | ICD-10-CM | POA: Diagnosis not present

## 2020-09-16 DIAGNOSIS — R195 Other fecal abnormalities: Secondary | ICD-10-CM | POA: Diagnosis not present

## 2020-09-16 DIAGNOSIS — K5730 Diverticulosis of large intestine without perforation or abscess without bleeding: Secondary | ICD-10-CM | POA: Diagnosis not present

## 2020-09-16 DIAGNOSIS — K648 Other hemorrhoids: Secondary | ICD-10-CM | POA: Diagnosis not present

## 2020-09-28 ENCOUNTER — Other Ambulatory Visit: Payer: Self-pay

## 2020-09-28 ENCOUNTER — Encounter (HOSPITAL_COMMUNITY): Admission: EM | Disposition: A | Discharge status: Home / Self Care | Payer: Self-pay | Source: Home / Self Care

## 2020-09-28 ENCOUNTER — Encounter (HOSPITAL_COMMUNITY): Payer: Self-pay

## 2020-09-28 ENCOUNTER — Emergency Department (HOSPITAL_COMMUNITY): Payer: Medicare Other | Admitting: Anesthesiology

## 2020-09-28 ENCOUNTER — Inpatient Hospital Stay (HOSPITAL_COMMUNITY)
Admission: EM | Admit: 2020-09-28 | Discharge: 2020-10-12 | DRG: 853 | Disposition: A | Discharge status: Home / Self Care | Payer: Medicare Other | Attending: Physician Assistant | Admitting: Physician Assistant

## 2020-09-28 ENCOUNTER — Emergency Department (HOSPITAL_COMMUNITY): Payer: Medicare Other

## 2020-09-28 DIAGNOSIS — L409 Psoriasis, unspecified: Secondary | ICD-10-CM | POA: Diagnosis present

## 2020-09-28 DIAGNOSIS — J9601 Acute respiratory failure with hypoxia: Secondary | ICD-10-CM | POA: Diagnosis present

## 2020-09-28 DIAGNOSIS — R918 Other nonspecific abnormal finding of lung field: Secondary | ICD-10-CM | POA: Diagnosis present

## 2020-09-28 DIAGNOSIS — J9811 Atelectasis: Secondary | ICD-10-CM | POA: Diagnosis not present

## 2020-09-28 DIAGNOSIS — A408 Other streptococcal sepsis: Secondary | ICD-10-CM | POA: Diagnosis present

## 2020-09-28 DIAGNOSIS — N3281 Overactive bladder: Secondary | ICD-10-CM | POA: Diagnosis present

## 2020-09-28 DIAGNOSIS — Z7982 Long term (current) use of aspirin: Secondary | ICD-10-CM

## 2020-09-28 DIAGNOSIS — E876 Hypokalemia: Secondary | ICD-10-CM | POA: Diagnosis not present

## 2020-09-28 DIAGNOSIS — I10 Essential (primary) hypertension: Secondary | ICD-10-CM | POA: Diagnosis present

## 2020-09-28 DIAGNOSIS — K449 Diaphragmatic hernia without obstruction or gangrene: Secondary | ICD-10-CM | POA: Diagnosis present

## 2020-09-28 DIAGNOSIS — Z888 Allergy status to other drugs, medicaments and biological substances status: Secondary | ICD-10-CM

## 2020-09-28 DIAGNOSIS — K660 Peritoneal adhesions (postprocedural) (postinfection): Secondary | ICD-10-CM | POA: Diagnosis present

## 2020-09-28 DIAGNOSIS — Z800 Family history of malignant neoplasm of digestive organs: Secondary | ICD-10-CM | POA: Insufficient documentation

## 2020-09-28 DIAGNOSIS — K651 Peritoneal abscess: Secondary | ICD-10-CM

## 2020-09-28 DIAGNOSIS — K668 Other specified disorders of peritoneum: Secondary | ICD-10-CM

## 2020-09-28 DIAGNOSIS — D509 Iron deficiency anemia, unspecified: Secondary | ICD-10-CM | POA: Diagnosis present

## 2020-09-28 DIAGNOSIS — K8020 Calculus of gallbladder without cholecystitis without obstruction: Secondary | ICD-10-CM | POA: Diagnosis not present

## 2020-09-28 DIAGNOSIS — K3521 Acute appendicitis with generalized peritonitis, with abscess: Principal | ICD-10-CM | POA: Diagnosis present

## 2020-09-28 DIAGNOSIS — D72829 Elevated white blood cell count, unspecified: Secondary | ICD-10-CM | POA: Diagnosis not present

## 2020-09-28 DIAGNOSIS — Z79899 Other long term (current) drug therapy: Secondary | ICD-10-CM

## 2020-09-28 DIAGNOSIS — Z20822 Contact with and (suspected) exposure to covid-19: Secondary | ICD-10-CM | POA: Diagnosis present

## 2020-09-28 DIAGNOSIS — M810 Age-related osteoporosis without current pathological fracture: Secondary | ICD-10-CM | POA: Insufficient documentation

## 2020-09-28 DIAGNOSIS — Z4682 Encounter for fitting and adjustment of non-vascular catheter: Secondary | ICD-10-CM | POA: Diagnosis not present

## 2020-09-28 DIAGNOSIS — R6521 Severe sepsis with septic shock: Secondary | ICD-10-CM | POA: Diagnosis present

## 2020-09-28 DIAGNOSIS — Z1611 Resistance to penicillins: Secondary | ICD-10-CM

## 2020-09-28 DIAGNOSIS — T8089XA Other complications following infusion, transfusion and therapeutic injection, initial encounter: Secondary | ICD-10-CM | POA: Diagnosis not present

## 2020-09-28 DIAGNOSIS — N179 Acute kidney failure, unspecified: Secondary | ICD-10-CM | POA: Diagnosis present

## 2020-09-28 DIAGNOSIS — A4151 Sepsis due to Escherichia coli [E. coli]: Secondary | ICD-10-CM | POA: Diagnosis not present

## 2020-09-28 DIAGNOSIS — R7989 Other specified abnormal findings of blood chemistry: Secondary | ICD-10-CM | POA: Diagnosis present

## 2020-09-28 DIAGNOSIS — I2510 Atherosclerotic heart disease of native coronary artery without angina pectoris: Secondary | ICD-10-CM | POA: Diagnosis present

## 2020-09-28 DIAGNOSIS — R350 Frequency of micturition: Secondary | ICD-10-CM | POA: Insufficient documentation

## 2020-09-28 DIAGNOSIS — I517 Cardiomegaly: Secondary | ICD-10-CM | POA: Diagnosis not present

## 2020-09-28 DIAGNOSIS — E7800 Pure hypercholesterolemia, unspecified: Secondary | ICD-10-CM | POA: Diagnosis present

## 2020-09-28 DIAGNOSIS — E785 Hyperlipidemia, unspecified: Secondary | ICD-10-CM | POA: Diagnosis present

## 2020-09-28 DIAGNOSIS — D62 Acute posthemorrhagic anemia: Secondary | ICD-10-CM | POA: Diagnosis not present

## 2020-09-28 DIAGNOSIS — K567 Ileus, unspecified: Secondary | ICD-10-CM | POA: Diagnosis not present

## 2020-09-28 DIAGNOSIS — Z6825 Body mass index (BMI) 25.0-25.9, adult: Secondary | ICD-10-CM

## 2020-09-28 DIAGNOSIS — J90 Pleural effusion, not elsewhere classified: Secondary | ICD-10-CM | POA: Diagnosis not present

## 2020-09-28 DIAGNOSIS — Z87891 Personal history of nicotine dependence: Secondary | ICD-10-CM | POA: Insufficient documentation

## 2020-09-28 DIAGNOSIS — Y828 Other medical devices associated with adverse incidents: Secondary | ICD-10-CM | POA: Diagnosis not present

## 2020-09-28 DIAGNOSIS — H9319 Tinnitus, unspecified ear: Secondary | ICD-10-CM | POA: Insufficient documentation

## 2020-09-28 DIAGNOSIS — Z860100 Personal history of colon polyps, unspecified: Secondary | ICD-10-CM | POA: Insufficient documentation

## 2020-09-28 DIAGNOSIS — E43 Unspecified severe protein-calorie malnutrition: Secondary | ICD-10-CM | POA: Diagnosis present

## 2020-09-28 DIAGNOSIS — A419 Sepsis, unspecified organism: Secondary | ICD-10-CM

## 2020-09-28 DIAGNOSIS — I6529 Occlusion and stenosis of unspecified carotid artery: Secondary | ICD-10-CM | POA: Insufficient documentation

## 2020-09-28 DIAGNOSIS — Z820 Family history of epilepsy and other diseases of the nervous system: Secondary | ICD-10-CM | POA: Insufficient documentation

## 2020-09-28 DIAGNOSIS — M7989 Other specified soft tissue disorders: Secondary | ICD-10-CM | POA: Diagnosis not present

## 2020-09-28 DIAGNOSIS — K35891 Other acute appendicitis without perforation, with gangrene: Secondary | ICD-10-CM | POA: Diagnosis present

## 2020-09-28 DIAGNOSIS — K3533 Acute appendicitis with perforation and localized peritonitis, with abscess: Secondary | ICD-10-CM | POA: Diagnosis not present

## 2020-09-28 DIAGNOSIS — R600 Localized edema: Secondary | ICD-10-CM | POA: Diagnosis not present

## 2020-09-28 DIAGNOSIS — R030 Elevated blood-pressure reading, without diagnosis of hypertension: Secondary | ICD-10-CM | POA: Diagnosis not present

## 2020-09-28 DIAGNOSIS — K7689 Other specified diseases of liver: Secondary | ICD-10-CM | POA: Diagnosis not present

## 2020-09-28 DIAGNOSIS — R140 Abdominal distension (gaseous): Secondary | ICD-10-CM | POA: Diagnosis not present

## 2020-09-28 DIAGNOSIS — R338 Other retention of urine: Secondary | ICD-10-CM | POA: Diagnosis present

## 2020-09-28 DIAGNOSIS — R079 Chest pain, unspecified: Secondary | ICD-10-CM | POA: Diagnosis not present

## 2020-09-28 DIAGNOSIS — J189 Pneumonia, unspecified organism: Secondary | ICD-10-CM | POA: Diagnosis not present

## 2020-09-28 DIAGNOSIS — N281 Cyst of kidney, acquired: Secondary | ICD-10-CM | POA: Diagnosis not present

## 2020-09-28 DIAGNOSIS — K353 Acute appendicitis with localized peritonitis, without perforation or gangrene: Secondary | ICD-10-CM | POA: Diagnosis not present

## 2020-09-28 DIAGNOSIS — R3129 Other microscopic hematuria: Secondary | ICD-10-CM | POA: Insufficient documentation

## 2020-09-28 DIAGNOSIS — R739 Hyperglycemia, unspecified: Secondary | ICD-10-CM | POA: Diagnosis present

## 2020-09-28 DIAGNOSIS — R000 Tachycardia, unspecified: Secondary | ICD-10-CM | POA: Diagnosis not present

## 2020-09-28 DIAGNOSIS — D75839 Thrombocytosis, unspecified: Secondary | ICD-10-CM | POA: Diagnosis not present

## 2020-09-28 HISTORY — PX: LAPAROSCOPIC APPENDECTOMY: SHX408

## 2020-09-28 LAB — COMPREHENSIVE METABOLIC PANEL
ALT: 21 U/L (ref 0–44)
AST: 28 U/L (ref 15–41)
Albumin: 3.2 g/dL — ABNORMAL LOW (ref 3.5–5.0)
Alkaline Phosphatase: 107 U/L (ref 38–126)
Anion gap: 12 (ref 5–15)
BUN: 36 mg/dL — ABNORMAL HIGH (ref 8–23)
CO2: 25 mmol/L (ref 22–32)
Calcium: 8.9 mg/dL (ref 8.9–10.3)
Chloride: 101 mmol/L (ref 98–111)
Creatinine, Ser: 1.4 mg/dL — ABNORMAL HIGH (ref 0.44–1.00)
GFR, Estimated: 39 mL/min — ABNORMAL LOW (ref >60)
Glucose, Bld: 146 mg/dL — ABNORMAL HIGH (ref 70–99)
Potassium: 3.9 mmol/L (ref 3.5–5.1)
Sodium: 138 mmol/L (ref 135–145)
Total Bilirubin: 0.9 mg/dL (ref 0.3–1.2)
Total Protein: 7.7 g/dL (ref 6.5–8.1)

## 2020-09-28 LAB — CBC
HCT: 37.3 % (ref 36.0–46.0)
Hemoglobin: 12.6 g/dL (ref 12.0–15.0)
MCH: 31.1 pg (ref 26.0–34.0)
MCHC: 33.8 g/dL (ref 30.0–36.0)
MCV: 92.1 fL (ref 80.0–100.0)
Platelets: 307 10*3/uL (ref 150–400)
RBC: 4.05 MIL/uL (ref 3.87–5.11)
RDW: 12.6 % (ref 11.5–15.5)
WBC: 10.9 10*3/uL — ABNORMAL HIGH (ref 4.0–10.5)
nRBC: 0 % (ref 0.0–0.2)

## 2020-09-28 LAB — LIPASE, BLOOD: Lipase: 25 U/L (ref 11–51)

## 2020-09-28 LAB — RESP PANEL BY RT-PCR (FLU A&B, COVID) ARPGX2
Influenza A by PCR: NEGATIVE
Influenza B by PCR: NEGATIVE
SARS Coronavirus 2 by RT PCR: NEGATIVE

## 2020-09-28 LAB — TROPONIN I (HIGH SENSITIVITY)
Troponin I (High Sensitivity): 6 ng/L (ref <18)
Troponin I (High Sensitivity): 5 ng/L (ref <18)

## 2020-09-28 LAB — MRSA PCR SCREENING: MRSA by PCR: NEGATIVE

## 2020-09-28 SURGERY — APPENDECTOMY, LAPAROSCOPIC
Anesthesia: General

## 2020-09-28 MED ORDER — ASPIRIN 81 MG PO CHEW: 81.0000 mg | CHEWABLE_TABLET | Freq: Every day | ORAL | Status: DC

## 2020-09-28 MED ORDER — AMISULPRIDE (ANTIEMETIC) 5 MG/2ML IV SOLN: 10.0000 mg | Freq: Once | INTRAVENOUS | Status: DC | PRN

## 2020-09-28 MED ORDER — OXYCODONE HCL 5 MG PO TABS: 5.0000 mg | ORAL_TABLET | Freq: Once | ORAL | Status: DC | PRN

## 2020-09-28 MED ORDER — LACTATED RINGERS IV BOLUS: 1000.0000 mL | Freq: Three times a day (TID) | INTRAVENOUS | Status: AC | PRN

## 2020-09-28 MED ORDER — METHOCARBAMOL 1000 MG/10ML IJ SOLN
1000.0000 mg | Freq: Four times a day (QID) | INTRAVENOUS | Status: DC | PRN
  Filled 2020-09-28: qty 10

## 2020-09-28 MED ORDER — ONDANSETRON HCL 4 MG/2ML IJ SOLN
4.0000 mg | Freq: Once | INTRAMUSCULAR | Status: AC
  Administered 2020-09-28: 4 mg via INTRAVENOUS
  Filled 2020-09-28: qty 2

## 2020-09-28 MED ORDER — DEXAMETHASONE SODIUM PHOSPHATE 10 MG/ML IJ SOLN
INTRAMUSCULAR | Status: DC | PRN
  Administered 2020-09-28: 6 mg via INTRAVENOUS

## 2020-09-28 MED ORDER — ONDANSETRON 4 MG PO TBDP: 4.0000 mg | ORAL_TABLET | Freq: Four times a day (QID) | ORAL | Status: DC | PRN

## 2020-09-28 MED ORDER — PIPERACILLIN-TAZOBACTAM 3.375 G IVPB
3.3750 g | Freq: Three times a day (TID) | INTRAVENOUS | Status: DC
  Administered 2020-09-28 – 2020-09-29 (×3): 3.375 g via INTRAVENOUS
  Filled 2020-09-28 (×3): qty 50

## 2020-09-28 MED ORDER — SODIUM CHLORIDE 0.9 % IV SOLN
INTRAVENOUS | Status: AC
  Administered 2020-09-28: 1000 mL
  Filled 2020-09-28: qty 6

## 2020-09-28 MED ORDER — ONDANSETRON HCL 4 MG/2ML IJ SOLN
INTRAMUSCULAR | Status: AC
  Filled 2020-09-28: qty 2

## 2020-09-28 MED ORDER — KETAMINE HCL 10 MG/ML IJ SOLN
INTRAMUSCULAR | Status: DC | PRN
  Administered 2020-09-28: 30 mg via INTRAVENOUS

## 2020-09-28 MED ORDER — ONDANSETRON HCL 4 MG/2ML IJ SOLN: 4.0000 mg | Freq: Once | INTRAMUSCULAR | Status: DC | PRN

## 2020-09-28 MED ORDER — SODIUM CHLORIDE 0.9 % IV BOLUS
1000.0000 mL | Freq: Once | INTRAVENOUS | Status: AC
  Administered 2020-09-28: 1000 mL via INTRAVENOUS

## 2020-09-28 MED ORDER — ROCURONIUM BROMIDE 10 MG/ML (PF) SYRINGE: PREFILLED_SYRINGE | INTRAVENOUS | Status: DC | PRN

## 2020-09-28 MED ORDER — PROCHLORPERAZINE EDISYLATE 10 MG/2ML IJ SOLN: 5.0000 mg | INTRAMUSCULAR | Status: DC | PRN

## 2020-09-28 MED ORDER — OXYCODONE HCL 5 MG PO TABS: 5.0000 mg | ORAL_TABLET | ORAL | Status: DC | PRN

## 2020-09-28 MED ORDER — DIPHENHYDRAMINE HCL 50 MG/ML IJ SOLN
12.5000 mg | Freq: Four times a day (QID) | INTRAMUSCULAR | Status: DC | PRN
  Administered 2020-10-10: 12.5 mg via INTRAVENOUS
  Filled 2020-09-28: qty 1

## 2020-09-28 MED ORDER — LIP MEDEX EX OINT
TOPICAL_OINTMENT | Freq: Once | CUTANEOUS | Status: AC
  Filled 2020-09-28: qty 7

## 2020-09-28 MED ORDER — LIDOCAINE 2% (20 MG/ML) 5 ML SYRINGE
INTRAMUSCULAR | Status: DC | PRN
  Administered 2020-09-28: 60 mg via INTRAVENOUS

## 2020-09-28 MED ORDER — ALBUTEROL SULFATE (2.5 MG/3ML) 0.083% IN NEBU: 2.5000 mg | INHALATION_SOLUTION | Freq: Four times a day (QID) | RESPIRATORY_TRACT | Status: DC | PRN

## 2020-09-28 MED ORDER — ORAL CARE MOUTH RINSE
15.0000 mL | Freq: Two times a day (BID) | OROMUCOSAL | Status: DC
  Administered 2020-09-28 – 2020-10-12 (×23): 15 mL via OROMUCOSAL

## 2020-09-28 MED ORDER — FENTANYL CITRATE (PF) 100 MCG/2ML IJ SOLN
INTRAMUSCULAR | Status: AC
  Filled 2020-09-28: qty 2

## 2020-09-28 MED ORDER — LIDOCAINE 2% (20 MG/ML) 5 ML SYRINGE
INTRAMUSCULAR | Status: AC
  Filled 2020-09-28: qty 5

## 2020-09-28 MED ORDER — BISACODYL 10 MG RE SUPP: 10.0000 mg | Freq: Two times a day (BID) | RECTAL | Status: DC | PRN

## 2020-09-28 MED ORDER — FENTANYL CITRATE (PF) 100 MCG/2ML IJ SOLN
25.0000 ug | INTRAMUSCULAR | Status: DC | PRN
  Administered 2020-09-28: 50 ug via INTRAVENOUS
  Administered 2020-09-28: 25 ug via INTRAVENOUS

## 2020-09-28 MED ORDER — SODIUM CHLORIDE 0.9 % IV SOLN: INTRAVENOUS | Status: DC

## 2020-09-28 MED ORDER — PROPOFOL 10 MG/ML IV BOLUS
INTRAVENOUS | Status: AC
  Filled 2020-09-28: qty 20

## 2020-09-28 MED ORDER — LACTATED RINGERS IV SOLN: INTRAVENOUS | Status: DC

## 2020-09-28 MED ORDER — ONDANSETRON HCL 4 MG/2ML IJ SOLN: 4.0000 mg | Freq: Four times a day (QID) | INTRAMUSCULAR | Status: DC | PRN

## 2020-09-28 MED ORDER — BUPIVACAINE LIPOSOME 1.3 % IJ SUSP
20.0000 mL | Freq: Once | INTRAMUSCULAR | Status: DC
  Filled 2020-09-28: qty 20

## 2020-09-28 MED ORDER — LACTATED RINGERS IV SOLN: INTRAVENOUS | Status: DC | PRN

## 2020-09-28 MED ORDER — ONDANSETRON HCL 4 MG/2ML IJ SOLN
4.0000 mg | Freq: Four times a day (QID) | INTRAMUSCULAR | Status: DC | PRN
  Administered 2020-10-03 – 2020-10-11 (×5): 4 mg via INTRAVENOUS
  Filled 2020-09-28 (×5): qty 2

## 2020-09-28 MED ORDER — ENOXAPARIN SODIUM 40 MG/0.4ML ~~LOC~~ SOLN
40.0000 mg | SUBCUTANEOUS | Status: DC
  Administered 2020-09-29 – 2020-10-12 (×14): 40 mg via SUBCUTANEOUS
  Filled 2020-09-28 (×14): qty 0.4

## 2020-09-28 MED ORDER — PHENYLEPHRINE HCL (PRESSORS) 10 MG/ML IV SOLN
INTRAVENOUS | Status: AC
  Filled 2020-09-28: qty 1

## 2020-09-28 MED ORDER — GENTAMICIN SULFATE 40 MG/ML IJ SOLN
INTRAMUSCULAR | Status: DC
Start: 2020-09-28 — End: 2020-09-28

## 2020-09-28 MED ORDER — HYDROMORPHONE HCL 1 MG/ML IJ SOLN
0.5000 mg | Freq: Once | INTRAMUSCULAR | Status: AC
  Administered 2020-09-28: 0.5 mg via INTRAVENOUS
  Filled 2020-09-28: qty 1

## 2020-09-28 MED ORDER — PHENYLEPHRINE 40 MCG/ML (10ML) SYRINGE FOR IV PUSH (FOR BLOOD PRESSURE SUPPORT)
PREFILLED_SYRINGE | INTRAVENOUS | Status: AC
  Filled 2020-09-28: qty 10

## 2020-09-28 MED ORDER — PIPERACILLIN-TAZOBACTAM 3.375 G IVPB 30 MIN
3.3750 g | Freq: Once | INTRAVENOUS | Status: AC
  Administered 2020-09-28: 3.375 g via INTRAVENOUS
  Filled 2020-09-28: qty 50

## 2020-09-28 MED ORDER — METOPROLOL TARTRATE 5 MG/5ML IV SOLN: 5.0000 mg | Freq: Four times a day (QID) | INTRAVENOUS | Status: DC | PRN

## 2020-09-28 MED ORDER — HYDROMORPHONE HCL 1 MG/ML IJ SOLN: 0.5000 mg | INTRAMUSCULAR | Status: DC | PRN

## 2020-09-28 MED ORDER — CEFOTETAN DISODIUM 2 G IJ SOLR
2.0000 g | INTRAMUSCULAR | Status: AC
  Administered 2020-09-28: 2 g via INTRAVENOUS
  Filled 2020-09-28: qty 2

## 2020-09-28 MED ORDER — CHLORHEXIDINE GLUCONATE CLOTH 2 % EX PADS
6.0000 | MEDICATED_PAD | Freq: Every day | CUTANEOUS | Status: DC
  Administered 2020-09-28 – 2020-10-11 (×13): 6 via TOPICAL

## 2020-09-28 MED ORDER — SIMETHICONE 80 MG PO CHEW: 40.0000 mg | CHEWABLE_TABLET | Freq: Four times a day (QID) | ORAL | Status: DC | PRN

## 2020-09-28 MED ORDER — LACTATED RINGERS IV SOLN: Freq: Once | INTRAVENOUS | Status: AC

## 2020-09-28 MED ORDER — SUCCINYLCHOLINE CHLORIDE 200 MG/10ML IV SOSY
PREFILLED_SYRINGE | INTRAVENOUS | Status: AC
  Filled 2020-09-28: qty 10

## 2020-09-28 MED ORDER — SODIUM CHLORIDE 0.9 % IV SOLN
8.0000 mg | Freq: Four times a day (QID) | INTRAVENOUS | Status: DC | PRN
  Filled 2020-09-28: qty 4

## 2020-09-28 MED ORDER — CHLORHEXIDINE GLUCONATE CLOTH 2 % EX PADS
6.0000 | MEDICATED_PAD | Freq: Once | CUTANEOUS | Status: AC
  Administered 2020-09-28: 6 via TOPICAL

## 2020-09-28 MED ORDER — KETAMINE HCL 10 MG/ML IJ SOLN
INTRAMUSCULAR | Status: AC
  Filled 2020-09-28: qty 1

## 2020-09-28 MED ORDER — DEXAMETHASONE SODIUM PHOSPHATE 10 MG/ML IJ SOLN
INTRAMUSCULAR | Status: AC
  Filled 2020-09-28: qty 1

## 2020-09-28 MED ORDER — DIPHENHYDRAMINE HCL 12.5 MG/5ML PO ELIX: 12.5000 mg | ORAL_SOLUTION | Freq: Four times a day (QID) | ORAL | Status: DC | PRN

## 2020-09-28 MED ORDER — SUGAMMADEX SODIUM 200 MG/2ML IV SOLN
INTRAVENOUS | Status: DC | PRN
  Administered 2020-09-28: 200 mg via INTRAVENOUS

## 2020-09-28 MED ORDER — LIP MEDEX EX OINT
1.0000 "application " | TOPICAL_OINTMENT | Freq: Two times a day (BID) | CUTANEOUS | Status: DC
  Administered 2020-09-28 – 2020-10-12 (×28): 1 via TOPICAL
  Filled 2020-09-28 (×5): qty 7

## 2020-09-28 MED ORDER — ACETAMINOPHEN 650 MG RE SUPP: 650.0000 mg | Freq: Four times a day (QID) | RECTAL | Status: DC | PRN

## 2020-09-28 MED ORDER — BUPIVACAINE HCL 0.25 % IJ SOLN
INTRAMUSCULAR | Status: AC
  Filled 2020-09-28: qty 1

## 2020-09-28 MED ORDER — SUCCINYLCHOLINE CHLORIDE 200 MG/10ML IV SOSY
PREFILLED_SYRINGE | INTRAVENOUS | Status: DC | PRN
  Administered 2020-09-28: 100 mg via INTRAVENOUS

## 2020-09-28 MED ORDER — BUPIVACAINE LIPOSOME 1.3 % IJ SUSP
INTRAMUSCULAR | Status: DC | PRN
  Administered 2020-09-28: 20 mL

## 2020-09-28 MED ORDER — ROCURONIUM BROMIDE 10 MG/ML (PF) SYRINGE
PREFILLED_SYRINGE | INTRAVENOUS | Status: AC
  Filled 2020-09-28: qty 10

## 2020-09-28 MED ORDER — ENALAPRILAT 1.25 MG/ML IV SOLN
0.6250 mg | Freq: Four times a day (QID) | INTRAVENOUS | Status: DC | PRN
  Filled 2020-09-28: qty 1

## 2020-09-28 MED ORDER — MAGIC MOUTHWASH
15.0000 mL | Freq: Four times a day (QID) | ORAL | Status: DC | PRN
  Filled 2020-09-28: qty 15

## 2020-09-28 MED ORDER — PHENYLEPHRINE 40 MCG/ML (10ML) SYRINGE FOR IV PUSH (FOR BLOOD PRESSURE SUPPORT)
PREFILLED_SYRINGE | INTRAVENOUS | Status: DC | PRN
  Administered 2020-09-28: 80 ug via INTRAVENOUS
  Administered 2020-09-28: 120 ug via INTRAVENOUS

## 2020-09-28 MED ORDER — ACETAMINOPHEN 325 MG PO TABS: 650.0000 mg | ORAL_TABLET | Freq: Four times a day (QID) | ORAL | Status: DC | PRN

## 2020-09-28 MED ORDER — PROPOFOL 10 MG/ML IV BOLUS
INTRAVENOUS | Status: DC | PRN
  Administered 2020-09-28: 150 mg via INTRAVENOUS

## 2020-09-28 MED ORDER — PHENYLEPHRINE HCL-NACL 10-0.9 MG/250ML-% IV SOLN
INTRAVENOUS | Status: DC | PRN
  Administered 2020-09-28: 50 ug/min via INTRAVENOUS

## 2020-09-28 MED ORDER — OXYCODONE HCL 5 MG/5ML PO SOLN: 5.0000 mg | Freq: Once | ORAL | Status: DC | PRN

## 2020-09-28 MED ORDER — FENTANYL CITRATE (PF) 250 MCG/5ML IJ SOLN
INTRAMUSCULAR | Status: AC
  Filled 2020-09-28: qty 5

## 2020-09-28 MED ORDER — POLYETHYLENE GLYCOL 3350 17 G PO PACK: 17.0000 g | PACK | Freq: Every day | ORAL | Status: DC | PRN

## 2020-09-28 MED ORDER — ROCURONIUM BROMIDE 10 MG/ML (PF) SYRINGE
PREFILLED_SYRINGE | INTRAVENOUS | Status: DC | PRN
  Administered 2020-09-28: 50 mg via INTRAVENOUS

## 2020-09-28 MED ORDER — PANTOPRAZOLE SODIUM 40 MG IV SOLR
40.0000 mg | Freq: Every day | INTRAVENOUS | Status: DC
  Administered 2020-09-28 – 2020-10-07 (×10): 40 mg via INTRAVENOUS
  Filled 2020-09-28 (×10): qty 40

## 2020-09-28 MED ORDER — SODIUM CHLORIDE 0.9 % IR SOLN
Status: DC | PRN
  Administered 2020-09-28 (×3): 3000 mL

## 2020-09-28 MED ORDER — ONDANSETRON HCL 4 MG/2ML IJ SOLN
INTRAMUSCULAR | Status: DC | PRN
  Administered 2020-09-28: 4 mg via INTRAVENOUS

## 2020-09-28 MED ORDER — IOHEXOL 300 MG/ML  SOLN
75.0000 mL | Freq: Once | INTRAMUSCULAR | Status: AC | PRN
  Administered 2020-09-28: 75 mL via INTRAVENOUS

## 2020-09-28 MED ORDER — ALUM & MAG HYDROXIDE-SIMETH 200-200-20 MG/5ML PO SUSP: 30.0000 mL | Freq: Four times a day (QID) | ORAL | Status: DC | PRN

## 2020-09-28 MED ORDER — BUPIVACAINE-EPINEPHRINE 0.25% -1:200000 IJ SOLN
INTRAMUSCULAR | Status: DC | PRN
  Administered 2020-09-28: 50 mL

## 2020-09-28 MED ORDER — FENTANYL CITRATE (PF) 250 MCG/5ML IJ SOLN
INTRAMUSCULAR | Status: DC | PRN
  Administered 2020-09-28 (×7): 50 ug via INTRAVENOUS

## 2020-09-28 SURGICAL SUPPLY — 59 items
APL PRP STRL LF DISP 70% ISPRP (MISCELLANEOUS) ×1
APPLIER CLIP 5 13 M/L LIGAMAX5 (MISCELLANEOUS)
APPLIER CLIP ROT 10 11.4 M/L (STAPLE)
APR CLP MED LRG 11.4X10 (STAPLE)
APR CLP MED LRG 5 ANG JAW (MISCELLANEOUS)
BAG SPEC RTRVL 10 TROC 200 (ENDOMECHANICALS) ×1
CABLE HIGH FREQUENCY MONO STRZ (ELECTRODE) ×2 IMPLANT
CHLORAPREP W/TINT 26 (MISCELLANEOUS) ×2 IMPLANT
CLIP APPLIE 5 13 M/L LIGAMAX5 (MISCELLANEOUS) IMPLANT
CLIP APPLIE ROT 10 11.4 M/L (STAPLE) IMPLANT
COVER SURGICAL LIGHT HANDLE (MISCELLANEOUS) ×2 IMPLANT
COVER WAND RF STERILE (DRAPES) IMPLANT
DECANTER SPIKE VIAL GLASS SM (MISCELLANEOUS) ×2 IMPLANT
DEVICE TROCAR PUNCTURE CLOSURE (ENDOMECHANICALS) IMPLANT
DRAPE LAPAROSCOPIC ABDOMINAL (DRAPES) ×2 IMPLANT
DRAPE WARM FLUID 44X44 (DRAPES) ×2 IMPLANT
DRSG TEGADERM 2-3/8X2-3/4 SM (GAUZE/BANDAGES/DRESSINGS) ×3 IMPLANT
DRSG TEGADERM 4X4.75 (GAUZE/BANDAGES/DRESSINGS) ×2 IMPLANT
ELECT REM PT RETURN 15FT ADLT (MISCELLANEOUS) ×2 IMPLANT
ENDOLOOP SUT PDS II  0 18 (SUTURE)
ENDOLOOP SUT PDS II 0 18 (SUTURE) IMPLANT
EVACUATOR SILICONE 100CC (DRAIN) ×4 IMPLANT
GAUZE SPONGE 2X2 8PLY STRL LF (GAUZE/BANDAGES/DRESSINGS) ×1 IMPLANT
GLOVE ECLIPSE 8.0 STRL XLNG CF (GLOVE) ×2 IMPLANT
GLOVE INDICATOR 8.0 STRL GRN (GLOVE) ×2 IMPLANT
GOWN STRL REUS W/TWL XL LVL3 (GOWN DISPOSABLE) ×4 IMPLANT
IRRIG SUCT STRYKERFLOW 2 WTIP (MISCELLANEOUS) ×2
IRRIGATION SUCT STRKRFLW 2 WTP (MISCELLANEOUS) ×1 IMPLANT
KIT BASIN OR (CUSTOM PROCEDURE TRAY) ×2 IMPLANT
KIT TURNOVER KIT A (KITS) IMPLANT
PAD POSITIONING PINK XL (MISCELLANEOUS) ×2 IMPLANT
PENCIL SMOKE EVACUATOR (MISCELLANEOUS) IMPLANT
POUCH RETRIEVAL ECOSAC 10 (ENDOMECHANICALS) ×1 IMPLANT
POUCH RETRIEVAL ECOSAC 10MM (ENDOMECHANICALS) ×2
RELOAD STAPLE 60 3.6 BLU REG (STAPLE) IMPLANT
RELOAD STAPLE 60 4.1 GRN THCK (STAPLE) IMPLANT
RELOAD STAPLER BLUE 60MM (STAPLE) IMPLANT
RELOAD STAPLER GREEN 60MM (STAPLE) IMPLANT
SCISSORS LAP 5X35 DISP (ENDOMECHANICALS) ×2 IMPLANT
SEALER TISSUE G2 STRG ARTC 35C (ENDOMECHANICALS) ×1 IMPLANT
SET TUBE SMOKE EVAC HIGH FLOW (TUBING) ×2 IMPLANT
SHEARS HARMONIC ACE PLUS 36CM (ENDOMECHANICALS) IMPLANT
SLEEVE XCEL OPT CAN 5 100 (ENDOMECHANICALS) ×2 IMPLANT
SPONGE GAUZE 2X2 8PLY STRL LF (GAUZE/BANDAGES/DRESSINGS) ×1 IMPLANT
SPONGE GAUZE 2X2 STER 10/PKG (GAUZE/BANDAGES/DRESSINGS)
STAPLE ECHEON FLEX 60 POW ENDO (STAPLE) ×1 IMPLANT
STAPLER RELOAD BLUE 60MM (STAPLE)
STAPLER RELOAD GREEN 60MM (STAPLE)
SUT MNCRL AB 4-0 PS2 18 (SUTURE) ×2 IMPLANT
SUT PDS AB 0 CT1 36 (SUTURE) IMPLANT
SUT PDS AB 1 CT1 27 (SUTURE) IMPLANT
SUT SILK 2 0 SH (SUTURE) IMPLANT
TOWEL OR 17X26 10 PK STRL BLUE (TOWEL DISPOSABLE) ×2 IMPLANT
TRAY FOLEY MTR SLVR 14FR STAT (SET/KITS/TRAYS/PACK) ×1 IMPLANT
TRAY FOLEY MTR SLVR 16FR STAT (SET/KITS/TRAYS/PACK) IMPLANT
TRAY LAPAROSCOPIC (CUSTOM PROCEDURE TRAY) ×2 IMPLANT
TROCAR ADV FIXATION 5X100MM (TROCAR) ×2 IMPLANT
TROCAR BLADELESS OPT 5 100 (ENDOMECHANICALS) ×6 IMPLANT
TROCAR XCEL 12X100 BLDLESS (ENDOMECHANICALS) ×2 IMPLANT

## 2020-09-28 NOTE — Consult Note (Addendum)
Medical Consultation  Molly Manning NTI:[id number] DOB: [birth year] DOA: 09/28/2020 PCP: Deland Pretty, MD   Requesting physician: Dr. Johney Maine Date of consultation: 09/28/20 Reason for consultation: Medical management  Impression/Recommendations Sepsis secondary to perforated appendix/appendicitis     - per primary team     - going to the OR today     - IV abx, fluids per primary  HLD     - can hold home meds tonight, resume tomorrow if allowed PO  Overactive bladder     - can hold home meds tonight, resume tomorrow if allowed PO  CAD     - continue ASA  TRH will follow-up again tomorrow. Please contact me if I can be of assistance in the meanwhile. Thank you for this consultation.  Chief Complaint: Abdominal pain  HPI:  Molly Manning is a 79 y.o. female with medical history significant of HLD, overactive bladder. Presenting today with abdominal pain. It's mostly right sided. It has been present off and on for almost 10 days. Last night, she developed severe abdominal pain that moved into her epigastric area and chest. She had N/V and chills throughout the night. She tried OTC meds (alka seltzer) but it didn't help. She became concerned and came to the ED.  In the ED,  A CT of the ab/pelvis showed possible perforated appendix w/ abscess. General surgery was called for admission. TRH consulted for medical management.  Review of Systems:  Denies dyspnea, palpitaitons. Reports ab pain, N/V, indigestion. Remainder of ROS is negative for all not mentioned in HPI.   PMHx - HLD - overactive bladder  History reviewed. No pertinent surgical history. Social History: Denies EtOH use, illicit Rx use, former smoker  Allergies  Allergen Reactions  . Fosamax [Alendronate] Other (See Comments)    joint pain; (pt does not recall)   History reviewed. No pertinent family history.  Prior to Admission medications   Medication Sig Start Date End Date Taking? Authorizing Provider  aspirin  81 MG chewable tablet Chew 81 mg by mouth daily.   Yes [provider]  Biotin 1000 MCG tablet Take 1,000 mcg by mouth daily.   Yes [provider]  Calcium Carb-Cholecalciferol (CALCIUM 1000 + D) 1000-800 MG-UNIT TABS Take 1 tablet by mouth daily.   Yes [provider]  Multiple Vitamins-Minerals (CENTRUM SILVER) tablet Take 1 tablet by mouth daily.   Yes [provider]  MYRBETRIQ 50 MG TB24 tablet Take 50 mg by mouth daily. 09/23/20  Yes [provider]  rosuvastatin (CRESTOR) 10 MG tablet Take 10 mg by mouth daily. 08/22/20  Yes [provider]  VITAMIN D PO Take 1 tablet by mouth daily.   Yes [provider]   Physical Exam: Blood pressure 117/70, pulse (!) 115, temperature 98.4 F (36.9 C), temperature source Oral, resp. rate 18, height 5\' 6"  (1.676 m), weight 70.3 kg, SpO2 95 %. Vitals:   09/28/20 1431 09/28/20 1501  BP:  117/70  Pulse:  (!) 115  Resp:  18  Temp:  98.4 F (36.9 C)  SpO2: 100% 95%    General: 79 y.o. female resting in bed in NAD Eyes: PERRL, normal sclera ENMT: Nares patent w/o discharge, orophaynx clear, dentition normal, ears w/o discharge/lesions/ulcers Neck: Supple, trachea midline Cardiovascular: tachy, +S1, S2, no m/g/r, equal pulses throughout Respiratory: CTABL, no w/r/r, normal WOB GI: BS hypoactive, ND, TTP RUQ/RLQ/LUQ, no masses noted, no organomegaly noted MSK: No e/c/c Skin: No rashes, bruises, ulcerations noted Neuro: A&O x 3, no  focal deficits Psyc: Appropriate interaction and affect, calm/cooperative  Labs on Admission:  Basic Metabolic Panel: Recent Labs  Lab 09/28/20 0850  NA 138  K 3.9  CL 101  CO2 25  GLUCOSE 146*  BUN 36*  CREATININE 1.40*  CALCIUM 8.9   Liver Function Tests: Recent Labs  Lab 09/28/20 0850  AST 28  ALT 21  ALKPHOS 107  BILITOT 0.9  PROT 7.7  ALBUMIN 3.2*   Recent Labs  Lab 09/28/20 0850  LIPASE 25   No results for input(s): AMMONIA  in the last 168 hours. CBC: Recent Labs  Lab 09/28/20 0850  WBC 10.9*  HGB 12.6  HCT 37.3  MCV 92.1  PLT 307   Cardiac Enzymes: No results for input(s): CKTOTAL, CKMB, CKMBINDEX, TROPONINI in the last 168 hours. BNP: Invalid input(s): POCBNP CBG: No results for input(s): GLUCAP in the last 168 hours.  Radiological Exams on Admission: CT Abdomen Pelvis W Contrast  Result Date: 09/28/2020 CLINICAL DATA:  Abdominal distension following colonoscopy several days ago EXAM: CT ABDOMEN AND PELVIS WITH CONTRAST TECHNIQUE: Multidetector CT imaging of the abdomen and pelvis was performed using the standard protocol following bolus administration of intravenous contrast. CONTRAST:  75mL OMNIPAQUE IOHEXOL 300 MG/ML  SOLN COMPARISON:  07/16/2015 FINDINGS: Lower chest: Lung bases again demonstrate a stable right lower lobe nodule posteriorly which has been stable over several previous exams consistent with a benign etiology. Mild left lower lobe atelectatic changes are seen. Hepatobiliary: Dependent density is noted within the gallbladder likely related to a combination of sludge and multiple small stones. The liver is within normal limits. Pancreas: Unremarkable. No pancreatic ductal dilatation or surrounding inflammatory changes. Spleen: Normal in size without focal abnormality. Adrenals/Urinary Tract: Adrenal glands are within normal limits. Kidneys demonstrate a normal enhancement pattern bilaterally. A cyst is noted on the right inferiorly measuring 2.2 cm slightly larger than that seen on prior exam. Normal excretion is noted on delayed images. No renal calculi are seen. No obstructive changes are noted. The bladder is within normal limits. Stomach/Bowel: Diverticulosis is noted throughout the colon with areas of pericolonic inflammatory change consistent with mild diverticulitis. Fluid and air are noted near the greater curvature of the stomach likely related to this diverticular inflammatory change.  The appendix well visualized and demonstrates no evidence of appendicoliths. It is partially air-filled. Surrounding the tip of the appendix, there is a focal air-fluid collection which measures approximately 3.4 x 4.2 cm in greatest dimension. These changes are suspicious for underlying appendiceal perforation with evolving abscess formation. Sliding-type hiatal hernia is noted with approximately 30% of the stomach within the chest. No definitive ulceration of the stomach is noted. The intervening small bowel is within normal limits. Vascular/Lymphatic: Diffuse atherosclerotic calcifications are noted. No significant lymphadenopathy is seen. Reproductive: Uterus demonstrates multiple uterine fibroids with calcification. No definitive adnexal mass is noted. Other: Free fluid is noted in the pelvic cul-de-sac Musculoskeletal: Degenerative changes of lumbar spine are seen. IMPRESSION: Air-fluid collection surrounding the tip of the appendix suggestive of distal appendiceal perforation and evolving abscess formation in the periappendiceal region. Diverticulosis is noted throughout the colon. There are some pericolonic areas of inflammatory change as well as foci of air consistent with diverticulitis. This is most notable in the mid to distal transverse colon. This likely accounts for the additional air and fluid surrounding the stomach. Stable right lower lobe nodule consistent with a benign etiology. Mild left basilar atelectasis. Cholelithiasis without complicating factors. Electronically Signed   By: Elta Guadeloupe  Lukens M.D.   On: 09/28/2020 11:23   DG Chest Portable 1 View  Result Date: 09/28/2020 CLINICAL DATA:  NG placement EXAM: PORTABLE CHEST 1 VIEW COMPARISON:  Earlier same day FINDINGS: New enteric tube passes into the distal stomach with tip looping back to the gastric body. Bowel gas pattern is not well evaluated. Left mid and lower lung opacities again noted. IMPRESSION: New enteric tube passes into the  stomach. Electronically Signed   By: Macy Mis M.D.   On: 09/28/2020 15:09   DG Chest Port 1 View  Result Date: 09/28/2020 CLINICAL DATA:  Chest pain EXAM: PORTABLE CHEST 1 VIEW COMPARISON:  05/23/2018 FINDINGS: Mild cardiomegaly. Atherosclerotic calcification of the aortic knob. Patchy airspace opacity within the left mid lung and left lung base. Right lung is clear. Possible trace left pleural effusion. No pneumothorax. IMPRESSION: Patchy airspace opacity within the left mid lung and left lung base suspicious for pneumonia. Electronically Signed   By: Davina Poke D.O.   On: 09/28/2020 09:47    EKG: Independently reviewed. Sinus tachy, no st elevations  Time spent: 55 minutes  Elius Etheredge A Adil Tugwell DO [facility]  If 7PM-7AM, please contact night-coverage [email or web address] 09/28/2020, 5:53 PM

## 2020-09-28 NOTE — ED Notes (Signed)
NG tube placed in right nare at 26", awaiting xray to verify placement before adding to LDAs.

## 2020-09-28 NOTE — Anesthesia Preprocedure Evaluation (Signed)
Anesthesia Evaluation  Patient identified by MRN, date of birth, ID band Patient awake    Reviewed: Allergy & Precautions, NPO status , Patient's Chart, lab work & pertinent test results  History of Anesthesia Complications Negative for: history of anesthetic complications  Airway Mallampati: III  TM Distance: >3 FB Neck ROM: Full    Dental  (+) Teeth Intact   Pulmonary neg pulmonary ROS,    Pulmonary exam normal        Cardiovascular + CAD  Normal cardiovascular exam  HLD   Neuro/Psych negative neurological ROS  negative psych ROS   GI/Hepatic Neg liver ROS, hiatal hernia, Pneumoperitoneum; suspected ruptured appendicitis   Endo/Other  negative endocrine ROS  Renal/GU Renal disease (Cr 1.40, K 3.9)  negative genitourinary   Musculoskeletal negative musculoskeletal ROS (+)   Abdominal   Peds  Hematology negative hematology ROS (+)   Anesthesia Other Findings   Reproductive/Obstetrics                            Anesthesia Physical Anesthesia Plan  ASA: II and emergent  Anesthesia Plan: General   Post-op Pain Management:    Induction: Intravenous and Rapid sequence  PONV Risk Score and Plan: 4 or greater and Ondansetron, Dexamethasone, Treatment may vary due to age or medical condition and Midazolam  Airway Management Planned: Oral ETT  Additional Equipment: None  Intra-op Plan:   Post-operative Plan: Extubation in OR  Informed Consent: I have reviewed the patients History and Physical, chart, labs and discussed the procedure including the risks, benefits and alternatives for the proposed anesthesia with the patient or authorized representative who has indicated his/her understanding and acceptance.     Dental advisory given  Plan Discussed with:   Anesthesia Plan Comments:        Anesthesia Quick Evaluation

## 2020-09-28 NOTE — ED Provider Notes (Signed)
[facility] DEPT Provider Note   CSN: [id number] Arrival date & time: 09/28/20  1027     History Chief Complaint  Patient presents with  . Chest Pain  . Abdominal Pain    Molly Manning is a 79 y.o. female.  79 year old female presents acute onset of epigastric abdominal pain that began yesterday.  Pain is been persistent and associated nonbilious emesis.  No fever or diarrhea.  No cardiac symptoms.  No prior history of same.  Pain is worse when he tries eat or drink.  Does radiate to her back.  Denies any distal numbness or tingling.  Has had some diffuse weakness peer denies any urinary symptoms.        History reviewed. No pertinent past medical history.  There are no problems to display for this patient.   History reviewed. No pertinent surgical history.   OB History   No obstetric history on file.     History reviewed. No pertinent family history.  Social History   Tobacco Use  . Smoking status: Not on file  Substance Use Topics  . Alcohol use: Not on file  . Drug use: Not on file    Home Medications Prior to Admission medications   Not on File    Allergies    Patient has no known allergies.  Review of Systems   Review of Systems  All other systems reviewed and are negative.   Physical Exam Updated Vital Signs BP 121/71 (BP Location: Right Arm)   Pulse (!) 123   Temp (!) 97.5 F (36.4 C) (Oral)   Resp (!) 33   SpO2 96%   Physical Exam Vitals and nursing note reviewed.  Constitutional:      General: She is not in acute distress.    Appearance: Normal appearance. She is well-developed. She is not toxic-appearing.  HENT:     Head: Normocephalic and atraumatic.  Eyes:     General: Lids are normal.     Conjunctiva/sclera: Conjunctivae normal.     Pupils: Pupils are equal, round, and reactive to light.  Neck:     Thyroid: No thyroid mass.     Trachea: No tracheal deviation.  Cardiovascular:     Rate and  Rhythm: Regular rhythm. Tachycardia present.     Heart sounds: Normal heart sounds. No murmur heard.  No gallop.   Pulmonary:     Effort: Pulmonary effort is normal. No respiratory distress.     Breath sounds: Normal breath sounds. No stridor. No decreased breath sounds, wheezing, rhonchi or rales.  Abdominal:     General: Bowel sounds are normal. There is no distension.     Palpations: Abdomen is soft.     Tenderness: There is abdominal tenderness in the right upper quadrant and epigastric area. There is guarding. There is no rebound.  Musculoskeletal:        General: No tenderness. Normal range of motion.     Cervical back: Normal range of motion and neck supple.  Skin:    General: Skin is warm and dry.     Findings: No abrasion or rash.  Neurological:     Mental Status: She is alert and oriented to person, place, and time.     GCS: GCS eye subscore is 4. GCS verbal subscore is 5. GCS motor subscore is 6.     Cranial Nerves: No cranial nerve deficit.     Sensory: No sensory deficit.  Psychiatric:  Speech: Speech normal.        Behavior: Behavior normal.     ED Results / Procedures / Treatments   Labs (all labs ordered are listed, but only abnormal results are displayed) Labs Reviewed  CBC  COMPREHENSIVE METABOLIC PANEL  LIPASE, BLOOD  TROPONIN I (HIGH SENSITIVITY)    EKG None  Radiology No results found.  Procedures Procedures (including critical care time)  Medications Ordered in ED Medications  sodium chloride 0.9 % bolus 1,000 mL (has no administration in time range)  0.9 %  sodium chloride infusion (has no administration in time range)  HYDROmorphone (DILAUDID) injection 0.5 mg (has no administration in time range)  ondansetron (ZOFRAN) injection 4 mg (has no administration in time range)    ED Course  I have reviewed the triage vital signs and the nursing notes.  Pertinent labs & imaging results that were available during my care of the patient  were reviewed by me and considered in my medical decision making (see chart for details).    MDM Rules/Calculators/A&P                          Patient medicated for pain here.  Laboratory studies noted.  Abdominal CT consistent with perforation of her appendix.  Patient started on IV Zosyn and will consult surgery and patient will require inpatient hospitalization Final Clinical Impression(s) / ED Diagnoses Final diagnoses:  None    Rx / DC Orders ED Discharge Orders    None       Lacretia Leigh, MD 09/28/20 1145

## 2020-09-28 NOTE — ED Notes (Signed)
XRAY at bedside to get xray to verify placement of NG tube.

## 2020-09-28 NOTE — ED Notes (Signed)
Husband at bedside.  

## 2020-09-28 NOTE — Anesthesia Procedure Notes (Signed)
Procedure Name: Intubation Date/Time: 09/28/2020 4:12 PM Performed by: Sharlette Dense, CRNA Patient Re-evaluated:Patient Re-evaluated prior to induction Oxygen Delivery Method: Circle system utilized Preoxygenation: Pre-oxygenation with 100% oxygen Induction Type: Rapid sequence Laryngoscope Size: Miller and 3 Grade View: Grade I Tube type: Oral Tube size: 7.5 mm Number of attempts: 1 Airway Equipment and Method: Stylet Placement Confirmation: ETT inserted through vocal cords under direct vision,  positive ETCO2 and breath sounds checked- equal and bilateral Secured at: 21 cm Tube secured with: Tape Dental Injury: Teeth and Oropharynx as per pre-operative assessment

## 2020-09-28 NOTE — ED Triage Notes (Signed)
Pt presents with c/o chest pain that started yesterday and abdominal pain that started 2 days ago. Pt reports she has been nauseated and unable to keep anything down.

## 2020-09-28 NOTE — Transfer of Care (Signed)
Immediate Anesthesia Transfer of Care Note  Patient: Molly Manning  Procedure(s) Performed: DIAGNOSTIC LAPAROSCOPY;  LAPAROSCOPIC DRAINAGE OF ABSESS X 2 LAPAROSCOPIC APPENDECTOMY (N/A )  Patient Location: PACU  Anesthesia Type:General  Level of Consciousness: awake and patient cooperative  Airway & Oxygen Therapy: Patient Spontanous Breathing and Patient connected to face mask  Post-op Assessment: Report given to RN and Post -op Vital signs reviewed and stable  Post vital signs: Reviewed and stable  Last Vitals:  Vitals Value Taken Time  BP    Temp    Pulse 114 09/28/20 1836  Resp 17 09/28/20 1836  SpO2 100 % 09/28/20 1836  Vitals shown include unvalidated device data.  Last Pain:  Vitals:   09/28/20 1501  TempSrc: Oral  PainSc: 10-Worst pain ever         Complications: No complications documented.

## 2020-09-28 NOTE — H&P (Signed)
[name] [birth year]  [id number].    Requesting MD: Lacretia Leigh Chief Complaint/Reason for Consult: abdominal pain  HPI:  Molly Manning is a 79yo female PMH HLD and OAB who presented to [facility] earlier today complaining of worsening abdominal pain. States that she had a colonoscopy 09/16/20. That weekend she just felt bad and had some right sided abdominal pain. Symptoms waxed and wanted for a few days then she had a BM ~09/24/20 and felt somewhat better. Then last night she developed acute onset severe chest/epigastric abdominal pain. Associated with multiple episodes of nausea and vomiting, as well as chills. Tried taking alka seltzer but this did not help. Denies fever, SOB, cough, urinary symptoms, or diarrhea.  In the ED she underwent CT scan which reports possible perforated appendicitis with developing abscess, diffuse diverticulosis with possible diverticulitis in the mid to distal transverse colon, and some foci of free air. WBC 10.9, Cr 1.4. Covid test negative. EKG sinus tachy with negative troponin. General surgery asked to see.  She had some coffee and water at 0530 this AM  Abdominal surgical history: none Anticoagulants: none Nonsmoker Drinks wine occassionally  Review of Systems  Constitutional: Positive for chills and malaise/fatigue. Negative for fever.  HENT: Negative.   Eyes: Negative.   Respiratory: Negative.   Cardiovascular: Negative.   Gastrointestinal: Positive for abdominal pain, constipation, heartburn, nausea and vomiting. Negative for diarrhea.  Genitourinary: Negative.   Musculoskeletal: Negative.   Skin: Negative.   Neurological: Negative.    All systems reviewed and otherwise negative except for as above  History reviewed. No pertinent family history.  History reviewed. No pertinent past medical history.  History reviewed. No pertinent surgical history.  Social History:  has no history on file for  tobacco use, alcohol use, and drug use.  Allergies: No Known Allergies  (Not in a hospital admission)   Prior to Admission medications   Medication Sig Start Date End Date Taking? Authorizing Provider  MYRBETRIQ 50 MG TB24 tablet Take 50 mg by mouth daily. 09/23/20   [provider]  rosuvastatin (CRESTOR) 10 MG tablet Take 10 mg by mouth daily. 08/22/20   [provider]    Blood pressure (!) 142/112, pulse (!) 108, temperature (!) 97.5 F (36.4 C), temperature source Oral, resp. rate (!) 27, height 5\' 6"  (1.676 m), weight 70.3 kg, SpO2 92 %. Physical Exam: General: pleasant, WD/WN elderly white female who is laying in bed in NAD HEENT: head is normocephalic, atraumatic.  Sclera are noninjected.  PERRL.  Ears and nose without any masses or lesions.  Mouth is pink and moist. Dentition fair Heart: mild tachycardia low 100s, regular rhythm.  Normal s1,s2. No obvious murmurs, gallops, or rubs noted.  Palpable pedal pulses bilaterally  Lungs: CTAB, no wheezes, rhonchi, or rales noted.  Respiratory effort nonlabored Abd: no incisions noted, distended but mostly soft, hypoactive BS, no masses, hernias, or organomegaly. Diffuse tenderness with guarding MS: no BUE/BLE edema, calves soft and nontender Skin: warm and dry with no masses, lesions, or rashes Psych: A&Ox4 with an appropriate affect Neuro: cranial nerves grossly intact, equal strength in BUE/BLE bilaterally, normal speech, thought process intact  Results for orders placed or performed during the hospital encounter of 09/28/20 (from the past 48 hour(s))  CBC     Status: Abnormal   Collection Time: 09/28/20  8:50 AM  Result Value Ref Range   WBC 10.9 (H) 4.0 - 10.5 K/uL   RBC 4.05 3.87 -  5.11 MIL/uL   Hemoglobin 12.6 12.0 - 15.0 g/dL   HCT 37.3 36 - 46 %   MCV 92.1 80.0 - 100.0 fL   MCH 31.1 26.0 - 34.0 pg   MCHC 33.8 30.0 - 36.0 g/dL   RDW 12.6 11.5 - 15.5 %   Platelets 307 150 - 400 K/uL   nRBC 0.0 0.0 - 0.2  %    Comment: Performed at [facility], [place] [address] [place], [place] [address]  Troponin I (High Sensitivity)     Status: None   Collection Time: 09/28/20  8:50 AM  Result Value Ref Range   Troponin I (High Sensitivity) 6 <18 ng/L    Comment: (NOTE) Elevated high sensitivity troponin I (hsTnI) values and significant  changes across serial measurements may suggest ACS but many other  chronic and acute conditions are known to elevate hsTnI results.  Refer to the "Links" section for chest pain algorithms and additional  guidance. Performed at [facility], [place] [address], [place], [place] [address]   Comprehensive metabolic panel     Status: Abnormal   Collection Time: 09/28/20  8:50 AM  Result Value Ref Range   Sodium 138 135 - 145 mmol/L   Potassium 3.9 3.5 - 5.1 mmol/L   Chloride 101 98 - 111 mmol/L   CO2 25 22 - 32 mmol/L   Glucose, Bld 146 (H) 70 - 99 mg/dL    Comment: Glucose reference range applies only to samples taken after fasting for at least 8 hours.   BUN 36 (H) 8 - 23 mg/dL   Creatinine, Ser 1.40 (H) 0.44 - 1.00 mg/dL   Calcium 8.9 8.9 - 10.3 mg/dL   Total Protein 7.7 6.5 - 8.1 g/dL   Albumin 3.2 (L) 3.5 - 5.0 g/dL   AST 28 15 - 41 U/L   ALT 21 0 - 44 U/L   Alkaline Phosphatase 107 38 - 126 U/L   Total Bilirubin 0.9 0.3 - 1.2 mg/dL   GFR, Estimated 39 (L) >60 mL/min    Comment: (NOTE) Calculated using the CKD-EPI Creatinine Equation (2021)    Anion gap 12 5 - 15    Comment: Performed at [facility], [place] [address] [place], [place] [address]  Lipase, blood     Status: None   Collection Time: 09/28/20  8:50 AM  Result Value Ref Range   Lipase 25 11 - 51 U/L    Comment: Performed at [facility], [place] [address], [place], [place] [address]  Resp Panel by RT-PCR (Flu A&B, Covid) Nasopharyngeal Swab     Status: None   Collection Time: 09/28/20  9:54 AM   Specimen: Nasopharyngeal Swab;  Nasopharyngeal(NP) swabs in vial transport medium  Result Value Ref Range   SARS Coronavirus 2 by RT PCR NEGATIVE NEGATIVE    Comment: (NOTE) SARS-CoV-2 target nucleic acids are NOT DETECTED.  The SARS-CoV-2 RNA is generally detectable in upper respiratory specimens during the acute phase of infection. The lowest concentration of SARS-CoV-2 viral copies this assay can detect is 138 copies/mL. A negative result does not preclude SARS-Cov-2 infection and should not be used as the sole basis for treatment or other patient management decisions. A negative result may occur with  improper specimen collection/handling, submission of specimen other than nasopharyngeal swab, presence of viral mutation(s) within the areas targeted by this assay, and inadequate number of viral copies(<138 copies/mL). A negative result must be combined with clinical observations, patient  history, and epidemiological information. The expected result is Negative.  Fact Sheet for Patients:  EntrepreneurPulse.com.au  Fact Sheet for Healthcare Providers:  IncredibleEmployment.be  This test is no t yet approved or cleared by the [place] FDA and  has been authorized for detection and/or diagnosis of SARS-CoV-2 by FDA under an Emergency Use Authorization (EUA). This EUA will remain  in effect (meaning this test can be used) for the duration of the COVID-19 declaration under Section 564(b)(1) of the Act, 21 U.S.C.section 360bbb-3(b)(1), unless the authorization is terminated  or revoked sooner.       Influenza A by PCR NEGATIVE NEGATIVE   Influenza B by PCR NEGATIVE NEGATIVE    Comment: (NOTE) The Xpert Xpress SARS-CoV-2/FLU/RSV plus assay is intended as an aid in the diagnosis of influenza from Nasopharyngeal swab specimens and should not be used as a sole basis for treatment. Nasal washings and aspirates are unacceptable for Xpert Xpress  SARS-CoV-2/FLU/RSV testing.  Fact Sheet for Patients: EntrepreneurPulse.com.au  Fact Sheet for Healthcare Providers: IncredibleEmployment.be  This test is not yet approved or cleared by the [place] FDA and has been authorized for detection and/or diagnosis of SARS-CoV-2 by FDA under an Emergency Use Authorization (EUA). This EUA will remain in effect (meaning this test can be used) for the duration of the COVID-19 declaration under Section 564(b)(1) of the Act, 21 U.S.C. section 360bbb-3(b)(1), unless the authorization is terminated or revoked.  Performed at [facility], [place] [address], [place], [place] [address]    CT Abdomen Pelvis W Contrast  Result Date: 09/28/2020 CLINICAL DATA:  Abdominal distension following colonoscopy several days ago EXAM: CT ABDOMEN AND PELVIS WITH CONTRAST TECHNIQUE: Multidetector CT imaging of the abdomen and pelvis was performed using the standard protocol following bolus administration of intravenous contrast. CONTRAST:  53mL OMNIPAQUE IOHEXOL 300 MG/ML  SOLN COMPARISON:  07/16/2015 FINDINGS: Lower chest: Lung bases again demonstrate a stable right lower lobe nodule posteriorly which has been stable over several previous exams consistent with a benign etiology. Mild left lower lobe atelectatic changes are seen. Hepatobiliary: Dependent density is noted within the gallbladder likely related to a combination of sludge and multiple small stones. The liver is within normal limits. Pancreas: Unremarkable. No pancreatic ductal dilatation or surrounding inflammatory changes. Spleen: Normal in size without focal abnormality. Adrenals/Urinary Tract: Adrenal glands are within normal limits. Kidneys demonstrate a normal enhancement pattern bilaterally. A cyst is noted on the right inferiorly measuring 2.2 cm slightly larger than that seen on prior exam. Normal excretion is noted on delayed images. No renal  calculi are seen. No obstructive changes are noted. The bladder is within normal limits. Stomach/Bowel: Diverticulosis is noted throughout the colon with areas of pericolonic inflammatory change consistent with mild diverticulitis. Fluid and air are noted near the greater curvature of the stomach likely related to this diverticular inflammatory change. The appendix well visualized and demonstrates no evidence of appendicoliths. It is partially air-filled. Surrounding the tip of the appendix, there is a focal air-fluid collection which measures approximately 3.4 x 4.2 cm in greatest dimension. These changes are suspicious for underlying appendiceal perforation with evolving abscess formation. Sliding-type hiatal hernia is noted with approximately 30% of the stomach within the chest. No definitive ulceration of the stomach is noted. The intervening small bowel is within normal limits. Vascular/Lymphatic: Diffuse atherosclerotic calcifications are noted. No significant lymphadenopathy is seen. Reproductive: Uterus demonstrates multiple uterine fibroids with calcification. No definitive adnexal mass is noted. Other: Free fluid is noted  in the pelvic cul-de-sac Musculoskeletal: Degenerative changes of lumbar spine are seen. IMPRESSION: Air-fluid collection surrounding the tip of the appendix suggestive of distal appendiceal perforation and evolving abscess formation in the periappendiceal region. Diverticulosis is noted throughout the colon. There are some pericolonic areas of inflammatory change as well as foci of air consistent with diverticulitis. This is most notable in the mid to distal transverse colon. This likely accounts for the additional air and fluid surrounding the stomach. Stable right lower lobe nodule consistent with a benign etiology. Mild left basilar atelectasis. Cholelithiasis without complicating factors. Electronically Signed   By: Inez Catalina M.D.   On: 09/28/2020 11:23   DG Chest Port 1  View  Result Date: 09/28/2020 CLINICAL DATA:  Chest pain EXAM: PORTABLE CHEST 1 VIEW COMPARISON:  05/23/2018 FINDINGS: Mild cardiomegaly. Atherosclerotic calcification of the aortic knob. Patchy airspace opacity within the left mid lung and left lung base. Right lung is clear. Possible trace left pleural effusion. No pneumothorax. IMPRESSION: Patchy airspace opacity within the left mid lung and left lung base suspicious for pneumonia. Electronically Signed   By: Davina Poke D.O.   On: 09/28/2020 09:47      Assessment/Plan HLD OAD AKI - Cr 1.4, IVF Elevated glucose - check A1c  Pneumoperitoneum - Patient with nearly 2 weeks of waxing/waning abdominal pain after colonoscopy on 09/16/20, acutely worse yesterday with epigastric pain, nausea, vomiting. CT scan reports possible perforated appendicitis with developing abscess, diffuse diverticulosis with possible diverticulitis in the mid to distal transverse colon, and some foci of free air. She is diffusely tender on abdominal exam. Unsure if this is straight forward appendicitis or if something else is going on. Imaging reviewed with MD and the patient would likely benefit from surgery. Will plan for diagnostic laparoscopy, appendectomy, possible bowel resection, possible ostomy. Plan for surgery today. Keep NPO. IV zosyn has been ordered. Covid test is negative.  ID - zosyn VTE - SCDs, lovenox postop FEN - IVF, NPO Foley - none Follow up - TBD   Wellington Hampshire, [facility] 09/28/2020, 12:41 PM Please see Amion for pager number during day hours 7:00am-4:30pm

## 2020-09-28 NOTE — Op Note (Signed)
PATIENT:  Molly Manning  79 y.o. female  Patient Care Team: Deland Pretty, MD as PCP - General (Internal Medicine) Ronnette Juniper, MD as Consulting Physician (Gastroenterology)  PRE-OPERATIVE DIAGNOSIS:  FREE AIR  POST-OPERATIVE DIAGNOSIS:  GANGREONOUS PERFORATED APPENDICITIS INTRA ABDOMINAL & RETROPERITONEAL ABSESSES FECULENT PERITONITIS  PROCEDURE:   LAPAROSCOPIC DRAINAGE OF ABSESS X 2  LAPAROSCOPIC APPENDECTOMY DIAGNOSTIC LAPAROSCOPY WITH ABDOMINAL WASHOUT  SURGEON:  Adin Hector, MD  ASSIST:  Nadeen Landau, MD  ANESTHESIA:   local and general  EBL:  Total I/O In: 4521.5 [I.V.:3471.5; IV Piggyback:1050] Out: 75 [Emesis/NG output:50; Blood:25]  Delay start of Pharmacological VTE agent (>24hrs) due to surgical blood loss or risk of bleeding:  no  DRAINS:  Left upper quadrant drain goes along the lesser curvature stomach into the lesser sac in retrosplenic space and left upper quadrant  Left lower quadrant drain goes in the right upper quadrant retroperitoneum and Morison's pouch to right paracolic gutter  SPECIMEN:  APPENDIX  DISPOSITION OF SPECIMEN:  PATHOLOGY  COUNTS:  YES  PLAN OF CARE: Admit to inpatient   PATIENT DISPOSITION:  PACU - guarded condition.   INDICATIONS: Patient with concerning symptoms & work up suspicious for appendicitis.  Surgery was recommended:  The anatomy & physiology of the digestive tract was discussed.  The pathophysiology of appendicitis was discussed.  Natural history risks without surgery was discussed.   I feel the risks of no intervention will lead to serious problems that outweigh the operative risks; therefore, I recommended diagnostic laparoscopy with removal of appendix to remove the pathology.  Laparoscopic & open techniques were discussed.   I noted a good likelihood this will help address the problem.    Risks such as bleeding, infection, abscess, leak, reoperation, possible ostomy, hernia, heart attack, death, and  other risks were discussed.  Goals of post-operative recovery were discussed as well.  We will work to minimize complications.  Questions were answered.  The patient expresses understanding & wishes to proceed with surgery.  OR FINDINGS: Patient had massive peritonitis with frank pus and purulence involving much of the upper abdomen.  Had retrocolic perforated appendicitis with gangrene and feculent peritonitis at the hepatic flexure.  Abscess in this region overlying the right kidney  Had abscess in the lesser sac with large volume of pus evacuated through hepatogastric ligament..  Purulent ascites in left upper quadrant retrosplenic space.  Colon without any evidence of perforation from cecum down to peritoneal reflection.  CASE DATA:  Type of patient?: LDOW CASE (Surgical Hospitalist [facility] Inpatient)  Status of Case? EMERGENT Add On  Infection Present At Time Of Surgery (PATOS)?  ABSCESS, PURULENCE, FECULENT PERITONITIS  DESCRIPTION:  Informed consent was confirmed.  The patient underwent general anaesthesia without difficulty.  The patient was positioned appropriately.  VTE prevention in place.  The patient's abdomen was clipped, prepped, & draped in a sterile fashion.  Surgical timeout confirmed our plan.  Peritoneal entry with a laparoscopic port was obtained using optical entry technique in the left upper abdomen as the patient was positioned in reverse Trendelenburg.  Entry was clean.  I induced carbon dioxide insufflation.  Camera inspection revealed no injury.  Extra ports were carefully placed under direct laparoscopic visualization.  We immediately encountered purulent ascites in the upper abdomen.  Frank pus in the left upper quadrant and dense phlegmon in the right upper quadrant.  I was able to mobilize the greater omentum up to the upper abdomen.  The pelvis and sigmoid colon appeared noninflamed along  the small bowel.  Suction aspirated purulent ascites in the left upper quadrant  down to the retrosplenic space.  I freed the left lateral sector to the liver off of the stomach and released a large abscess in the lesser sac.  We did meticulous inspection and saw no perforation of the stomach.  Was able to follow down the pylorus and duodenal bulb.  There were inflammatory lesions and phlegmon between the gallbladder and duodenum but they seemed secondarily inflamed.  Began to mobilize the hepatic flexure in a superior to inferior fashion.  Freed the hepatic flexure and proximal transverse colon off its adhesions to the retroperitoneum and got into a abscess cavity with stool.  Mobilized in a superior to inferior fashion to get the colon mesentery off the retroperitoneum and right kidney.  I then mobilized the ileocecal region off the retroperitoneum and inferior to superior fashion.  Met the middle and eventually mobilized the ascending colon mesentery in a lateral medial fashion to roll the entire right colon up.  This can see a frankly perforated appendix that was obliterated mostly with gangrene.  Stool balls noted there.  I could free it off the hepatic flexure mesentery and release purulence there.  Transected through the mesoappendix until I got to the appendiceal base and cecum.  We opened up the transverse colon mesentery to evacuate the abscess and copiously irrigated.  We could see no cecal or colon perforation.  We expected the ileum cecum and ascending colon.  Antimesenteric side was not inflamed.  Retroperitoneum was quite inflamed but the epicenter was at the tip of the perforated appendix.  We did copious irrigation.  I did place pressure on the cecum and transverse colon and could not reproduce any P perforation in the abscess cavity of the colon arguing against a delayed colon perforation or injury.  The appendix seem to be the cause of the stool and abscess.  I transected the appendix with a 60 blue load firing with a laparoscopic stapler.  Got a healthy cuff of cecum.   Placed the appendix inside an Endo Catch bag and removed it out the left upper quadrant staple port site.  I used a laparoscopic suture passer with #1 PDS around the left upper quadrant 12 mm port site through and through and clamped that.  I did copious irrigation of 3 L of isotonic fluid through the entire abdomen and quadrants.  Washed out the lesser sac and released remaining purulent ascites and abscess up there.  Copiously irrigated in the right upper quadrant retroperitoneum as well.  Washed out the pelvis.  We inspected the small bowel and the terminal ileum proximally showed no interloop adhesions or abscess.  No Meckel's diverticulum or any other pathophysiology.  Again reinspected the colon so no injury or perforation.  Serial foci intact.  No serosal injury or other abnormality.  Because of the frank abscesses I did place drains as noted above primarily in the right retroperitoneum retrohepatic sac space as well as the left upper quadrant and lesser sac.  I did a final irrigation with antibiotic clindamycin/gentamicin.  Left that liter in place and evacuated carbon oxide and remove ports.  Drain secured with Prolene stitch.  Fascial stitch at the stapler port site tied down.  Of the port sites closed with Monocryl.  Sterile dressing applied.  Patient extubated.  Patient has been tachycardic with soft blood pressure concerning for septic shock.  Will watch in the stepdown unit with IV antibiotics and close monitoring.  We have already consulted internal medicine to help follow the patient with [place] given her advanced age.  I suspect the patient is going used in the hospital for at least a week and tissue patient of prolonged ileus given feculent peritonitis and multiple abscesses in the setting of gangrenous perforated appendicitis.  Patient will need IV antibiotics for 5 days.  High likelihood of repeat CAT scan to rule out delayed abscess formation despite washout and drained at least overnight and will  need antibiotics for 5 days. I made an attempt to locate family to discuss patient's status and recommendations.  No one is available at this time.  I will try again later   Adin Hector, M.D., F.A.C.S. Gastrointestinal and Minimally Invasive Surgery [facility] [address], [place], [place] [address] [phone] Main / Paging  09/28/2020 6:29 PM

## 2020-09-28 NOTE — Anesthesia Postprocedure Evaluation (Signed)
Anesthesia Post Note  Patient: Molly Manning  Procedure(s) Performed: DIAGNOSTIC LAPAROSCOPY;  LAPAROSCOPIC DRAINAGE OF ABSESS X 2 LAPAROSCOPIC APPENDECTOMY (N/A )     Patient location during evaluation: PACU Anesthesia Type: General Level of consciousness: awake and alert Pain management: pain level controlled Vital Signs Assessment: post-procedure vital signs reviewed and stable Respiratory status: spontaneous breathing, nonlabored ventilation, respiratory function stable and patient connected to nasal cannula oxygen Cardiovascular status: blood pressure returned to baseline and stable Postop Assessment: no apparent nausea or vomiting Anesthetic complications: no   No complications documented.  Last Vitals:  Vitals:   09/28/20 1915 09/28/20 1930  BP: (!) 142/72 139/78  Pulse: (!) 114 (!) 113  Resp: 13 15  Temp:    SpO2: 95% 95%    Last Pain:  Vitals:   09/28/20 1935  TempSrc:   PainSc: [place]

## 2020-09-29 ENCOUNTER — Encounter (HOSPITAL_COMMUNITY): Payer: Self-pay | Admitting: Surgery

## 2020-09-29 DIAGNOSIS — K3521 Acute appendicitis with generalized peritonitis, with abscess: Secondary | ICD-10-CM | POA: Diagnosis not present

## 2020-09-29 LAB — BASIC METABOLIC PANEL
Anion gap: 10 (ref 5–15)
BUN: 34 mg/dL — ABNORMAL HIGH (ref 8–23)
CO2: 22 mmol/L (ref 22–32)
Calcium: 7.5 mg/dL — ABNORMAL LOW (ref 8.9–10.3)
Chloride: 108 mmol/L (ref 98–111)
Creatinine, Ser: 0.95 mg/dL (ref 0.44–1.00)
GFR, Estimated: >60 mL/min (ref >60)
Glucose, Bld: 112 mg/dL — ABNORMAL HIGH (ref 70–99)
Potassium: 4 mmol/L (ref 3.5–5.1)
Sodium: 140 mmol/L (ref 135–145)

## 2020-09-29 LAB — CBC
HCT: 32.9 % — ABNORMAL LOW (ref 36.0–46.0)
Hemoglobin: 10.8 g/dL — ABNORMAL LOW (ref 12.0–15.0)
MCH: 30.9 pg (ref 26.0–34.0)
MCHC: 32.8 g/dL (ref 30.0–36.0)
MCV: 94.3 fL (ref 80.0–100.0)
Platelets: 258 10*3/uL (ref 150–400)
RBC: 3.49 MIL/uL — ABNORMAL LOW (ref 3.87–5.11)
RDW: 13.2 % (ref 11.5–15.5)
WBC: 6.4 10*3/uL (ref 4.0–10.5)
nRBC: 0 % (ref 0.0–0.2)

## 2020-09-29 LAB — PREALBUMIN: Prealbumin: <5 mg/dL — ABNORMAL LOW (ref 18–38)

## 2020-09-29 LAB — MAGNESIUM: Magnesium: 1.7 mg/dL (ref 1.7–2.4)

## 2020-09-29 LAB — PHOSPHORUS: Phosphorus: 3.2 mg/dL (ref 2.5–4.6)

## 2020-09-29 MED ORDER — ACETAMINOPHEN 325 MG PO TABS: 650.0000 mg | ORAL_TABLET | Freq: Four times a day (QID) | ORAL | Status: DC | PRN

## 2020-09-29 MED ORDER — LACTATED RINGERS IV SOLN: INTRAVENOUS | Status: DC

## 2020-09-29 MED ORDER — MAGNESIUM SULFATE 2 GM/50ML IV SOLN
2.0000 g | Freq: Once | INTRAVENOUS | Status: AC
  Administered 2020-09-29: 2 g via INTRAVENOUS
  Filled 2020-09-29: qty 50

## 2020-09-29 MED ORDER — PHENOL 1.4 % MT LIQD
1.0000 | OROMUCOSAL | Status: DC | PRN
  Administered 2020-09-29: 1 via OROMUCOSAL
  Filled 2020-09-29: qty 177

## 2020-09-29 MED ORDER — LACTATED RINGERS IV BOLUS: 500.0000 mL | Freq: Once | INTRAVENOUS | Status: DC

## 2020-09-29 MED ORDER — SODIUM CHLORIDE 0.9 % IV SOLN
INTRAVENOUS | Status: DC | PRN
  Administered 2020-09-29 – 2020-10-02 (×2): 250 mL via INTRAVENOUS

## 2020-09-29 MED ORDER — ACETAMINOPHEN 650 MG RE SUPP: 650.0000 mg | Freq: Four times a day (QID) | RECTAL | Status: DC | PRN

## 2020-09-29 MED ORDER — HYDROMORPHONE HCL 1 MG/ML IJ SOLN
0.5000 mg | INTRAMUSCULAR | Status: DC | PRN
  Administered 2020-10-01: 0.5 mg via INTRAVENOUS
  Administered 2020-10-01 – 2020-10-04 (×9): 1 mg via INTRAVENOUS
  Filled 2020-09-29 (×11): qty 1

## 2020-09-29 MED ORDER — PIPERACILLIN-TAZOBACTAM 3.375 G IVPB
3.3750 g | Freq: Three times a day (TID) | INTRAVENOUS | Status: AC
  Administered 2020-09-29 – 2020-10-08 (×26): 3.375 g via INTRAVENOUS
  Filled 2020-09-29 (×28): qty 50

## 2020-09-29 MED ORDER — ACETAMINOPHEN 10 MG/ML IV SOLN
1000.0000 mg | Freq: Four times a day (QID) | INTRAVENOUS | Status: AC
  Administered 2020-09-29 – 2020-09-30 (×4): 1000 mg via INTRAVENOUS
  Filled 2020-09-29 (×4): qty 100

## 2020-09-29 NOTE — Progress Notes (Addendum)
Consult NOTE    Molly Manning  ZTI:[id number] DOB: 27-Feb-1941 DOA: 09/28/2020 PCP: Deland Pretty, MD   Chief Complaint  Patient presents with  . Chest Pain  . Abdominal Pain    Brief Narrative: Molly Manning is Molly Manning 79 y.o. female with medical history significant of HLD, overactive bladder. She presented on 11/23 with abdominal pain and was found to have CT imaging concerning for perforated appendicitis.  She's now s/p laparoscopic appendectomy with drainage of abscess and abdominal washout by surgery.  [facility] was consulted for medical management.   Assessment & Plan:   Principal Problem:   Gangrenous perforated appendicitis with generalized peritonitis & abscesses s/p lap appendectomy & washout 09/28/2020 Active Problems:   Atherosclerosis of coronary artery without angina pectoris   Hiatal hernia   Multiple nodules of lung   Psoriasis   Pure hypercholesterolemia   Pneumoperitoneum   Septic shock (HCC)   Acute appendicitis with generalized peritonitis, abscess, and gangrene   Hypomagnesemia  Sepsis secondary to gangrenous perforated appendicitis  Intraabdominal and Retroperitoneal Abscesses  Feculent Peritonitis     - per primary team     - s/p laparoscopic drainage of abscess x2, laparoscopic appendectomy, diagnostic laparoscopy with abdominal washout on 11/23     - follow surg path     - NGT in place, NPO     - continue abx and IVF     - therapy eval  Acute Hypoxic Respiratory Failure  Community Acquired Pneumonia     - currently on 2 L oxygen, CXR from 11/23 with patchy airspace opacities within L mid lung and L lung base concerning for pneumonia      - continue abx as above      - repeat CXR 11/25  Normocytic Anemia        - likely dilutional, possible component of blood loss from surgery, continue to monitor  HLD     - hold crestor while NPO  Overactive bladder     - hold myrbetriq while NPO   Primary Prevention     - hold ASA while NPO  DVT prophylaxis:  lovenox Code Status: full  Family Communication: none at bedside, declines me calling today Disposition:   Status is: Inpatient  Remains inpatient appropriate because:Inpatient level of care appropriate due to severity of illness   Dispo: The patient is from: Home              Anticipated d/c is to: pending              Anticipated d/c date is: > 3 days              Patient currently is not medically stable to d/c.       Consultants:   Surgery is primary  [place] is consulting  Procedures:  s/p laparoscopic drainage of abscess x2, laparoscopic appendectomy, diagnostic laparoscopy with abdominal washout on 11/23  Antimicrobials: Anti-infectives (From admission, onward)   Start     Dose/Rate Route Frequency Ordered Stop   09/28/20 2000  piperacillin-tazobactam (ZOSYN) IVPB 3.375 g        3.375 g 12.5 mL/hr over 240 Minutes Intravenous Every 8 hours 09/28/20 1937     09/28/20 1830  clindamycin (CLEOCIN) 900 mg, gentamicin (GARAMYCIN) 240 mg in sodium chloride 0.9 % 1,000 mL for intraperitoneal lavage         Irrigation To Surgery 09/28/20 1736 09/28/20 1806   09/28/20 1630  clindamycin (CLEOCIN) 900 mg, gentamicin (GARAMYCIN) 240 mg in sodium  chloride 0.9 % 1,000 mL for intraperitoneal lavage  Status:  Discontinued       Note to Pharmacy: Have in the  OR room for final irrigation in bowel surgery case to minimize risk of abscess/infection Pharmacy may adjust dosing strength, schedule, rate of infusion, etc as needed to optimize therapy    Irrigation To Surgery 09/28/20 1618 09/28/20 1649   09/28/20 1600  cefoTEtan (CEFOTAN) 2 g in sodium chloride 0.9 % 100 mL IVPB        2 g 200 mL/hr over 30 Minutes Intravenous On call to O.R. 09/28/20 1320 09/28/20 1605   09/28/20 1145  piperacillin-tazobactam (ZOSYN) IVPB 3.375 g        3.375 g 100 mL/hr over 30 Minutes Intravenous  Once 09/28/20 1144 09/28/20 1353     Subjective: Abdominal pain improved, but still present No  flatus  Objective: Vitals:   09/29/20 0401 09/29/20 0500 09/29/20 0600 09/29/20 0800  BP:  112/60 136/71 137/76  Pulse:  (!) 103 (!) 106 (!) 107  Resp:  16 (!) 30 (!) 21  Temp:      TempSrc:      SpO2:  96% 95% 96%  Weight: 77 kg     Height:        Intake/Output Summary (Last 24 hours) at 09/29/2020 0909 Last data filed at 09/29/2020 0800 Gross per 24 hour  Intake 6187.49 ml  Output 1040 ml  Net 5147.49 ml   Filed Weights   09/28/20 1501 09/28/20 2023 09/29/20 0401  Weight: 70.3 kg 76.9 kg 77 kg    Examination:  General exam: Appears calm and comfortable  Respiratory system: Clear to auscultation. Respiratory effort normal. Cardiovascular system: S1 & S2 heard, RRR.  Gastrointestinal system: Abdomen is distended, diffusely tender - JP drains x2 with serosanguinous drainage Central nervous system: Alert and oriented. No focal neurological deficits. Extremities: no LEE Skin: No rashes, lesions or ulcers Psychiatry: Judgement and insight appear normal. Mood & affect appropriate.     Data Reviewed: I have personally reviewed following labs and imaging studies  CBC: Recent Labs  Lab 09/28/20 0850 09/29/20 0232  WBC 10.9* 6.4  HGB 12.6 10.8*  HCT 37.3 32.9*  MCV 92.1 94.3  PLT 307 366    Basic Metabolic Panel: Recent Labs  Lab 09/28/20 0850 09/29/20 0232  NA 138 140  K 3.9 4.0  CL 101 108  CO2 25 22  GLUCOSE 146* 112*  BUN 36* 34*  CREATININE 1.40* 0.95  CALCIUM 8.9 7.5*  MG  --  1.7  PHOS  --  3.2    GFR: Estimated Creatinine Clearance: 51.2 mL/min (by C-G formula based on SCr of 0.95 mg/dL).  Liver Function Tests: Recent Labs  Lab 09/28/20 0850  AST 28  ALT 21  ALKPHOS 107  BILITOT 0.9  PROT 7.7  ALBUMIN 3.2*    CBG: No results for input(s): GLUCAP in the last 168 hours.   Recent Results (from the past 240 hour(s))  Resp Panel by RT-PCR (Flu Channa Hazelett&B, Covid) Nasopharyngeal Swab     Status: None   Collection Time: 09/28/20  9:54 AM    Specimen: Nasopharyngeal Swab; Nasopharyngeal(NP) swabs in vial transport medium  Result Value Ref Range Status   SARS Coronavirus 2 by RT PCR NEGATIVE NEGATIVE Final    Comment: (NOTE) SARS-CoV-2 target nucleic acids are NOT DETECTED.  The SARS-CoV-2 RNA is generally detectable in upper respiratory specimens during the acute phase of infection. The lowest concentration of SARS-CoV-2 viral  copies this assay can detect is 138 copies/mL. Jeoffrey Eleazer negative result does not preclude SARS-Cov-2 infection and should not be used as the sole basis for treatment or other patient management decisions. Lashanda Storlie negative result may occur with  improper specimen collection/handling, submission of specimen other than nasopharyngeal swab, presence of viral mutation(s) within the areas targeted by this assay, and inadequate number of viral copies(<138 copies/mL). Jovonta Levit negative result must be combined with clinical observations, patient history, and epidemiological information. The expected result is Negative.  Fact Sheet for Patients:  EntrepreneurPulse.com.au  Fact Sheet for Healthcare Providers:  IncredibleEmployment.be  This test is no t yet approved or cleared by the [place] FDA and  has been authorized for detection and/or diagnosis of SARS-CoV-2 by FDA under an Emergency Use Authorization (EUA). This EUA will remain  in effect (meaning this test can be used) for the duration of the COVID-19 declaration under Section 564(b)(1) of the Act, 21 U.S.C.section 360bbb-3(b)(1), unless the authorization is terminated  or revoked sooner.       Influenza Ebony Yorio by PCR NEGATIVE NEGATIVE Final   Influenza B by PCR NEGATIVE NEGATIVE Final    Comment: (NOTE) The Xpert Xpress SARS-CoV-2/FLU/RSV plus assay is intended as an aid in the diagnosis of influenza from Nasopharyngeal swab specimens and should not be used as Shelly Shoultz sole basis for treatment. Nasal washings and aspirates are  unacceptable for Xpert Xpress SARS-CoV-2/FLU/RSV testing.  Fact Sheet for Patients: EntrepreneurPulse.com.au  Fact Sheet for Healthcare Providers: IncredibleEmployment.be  This test is not yet approved or cleared by the [place] FDA and has been authorized for detection and/or diagnosis of SARS-CoV-2 by FDA under an Emergency Use Authorization (EUA). This EUA will remain in effect (meaning this test can be used) for the duration of the COVID-19 declaration under Section 564(b)(1) of the Act, 21 U.S.C. section 360bbb-3(b)(1), unless the authorization is terminated or revoked.  Performed at [facility], [place] [address], [place], [place] [address]   MRSA PCR Screening     Status: None   Collection Time: 09/28/20  8:15 PM   Specimen: Nasopharyngeal Wash  Result Value Ref Range Status   MRSA by PCR NEGATIVE NEGATIVE Final    Comment:        The GeneXpert MRSA Assay (FDA approved for NASAL specimens only), is one component of Gregg Holster comprehensive MRSA colonization surveillance program. It is not intended to diagnose MRSA infection nor to guide or monitor treatment for MRSA infections. Performed at [facility], [place] [address], [place], [place] [address]          Radiology Studies: CT Abdomen Pelvis W Contrast  Result Date: 09/28/2020 CLINICAL DATA:  Abdominal distension following colonoscopy several days ago EXAM: CT ABDOMEN AND PELVIS WITH CONTRAST TECHNIQUE: Multidetector CT imaging of the abdomen and pelvis was performed using the standard protocol following bolus administration of intravenous contrast. CONTRAST:  80mL OMNIPAQUE IOHEXOL 300 MG/ML  SOLN COMPARISON:  07/16/2015 FINDINGS: Lower chest: Lung bases again demonstrate Latiana Tomei stable right lower lobe nodule posteriorly which has been stable over several previous exams consistent with [employer] benign etiology. Mild left lower lobe atelectatic changes are  seen. Hepatobiliary: Dependent density is noted within the gallbladder likely related to Camerin Jimenez combination of sludge and multiple small stones. The liver is within normal limits. Pancreas: Unremarkable. No pancreatic ductal dilatation or surrounding inflammatory changes. Spleen: Normal in size without focal abnormality. Adrenals/Urinary Tract: Adrenal glands are within normal limits. Kidneys demonstrate Sweet Jarvis normal enhancement pattern bilaterally.  Marvis Saefong cyst is noted on the right inferiorly measuring 2.2 cm slightly larger than that seen on prior exam. Normal excretion is noted on delayed images. No renal calculi are seen. No obstructive changes are noted. The bladder is within normal limits. Stomach/Bowel: Diverticulosis is noted throughout the colon with areas of pericolonic inflammatory change consistent with mild diverticulitis. Fluid and air are noted near the greater curvature of the stomach likely related to this diverticular inflammatory change. The appendix well visualized and demonstrates no evidence of appendicoliths. It is partially air-filled. Surrounding the tip of the appendix, there is Nicklas Mcsweeney focal air-fluid collection which measures approximately 3.4 x 4.2 cm in greatest dimension. These changes are suspicious for underlying appendiceal perforation with evolving abscess formation. Sliding-type hiatal hernia is noted with approximately 30% of the stomach within the chest. No definitive ulceration of the stomach is noted. The intervening small bowel is within normal limits. Vascular/Lymphatic: Diffuse atherosclerotic calcifications are noted. No significant lymphadenopathy is seen. Reproductive: Uterus demonstrates multiple uterine fibroids with calcification. No definitive adnexal mass is noted. Other: Free fluid is noted in the pelvic cul-de-sac Musculoskeletal: Degenerative changes of lumbar spine are seen. IMPRESSION: Air-fluid collection surrounding the tip of the appendix suggestive of distal appendiceal  perforation and evolving abscess formation in the periappendiceal region. Diverticulosis is noted throughout the colon. There are some pericolonic areas of inflammatory change as well as foci of air consistent with diverticulitis. This is most notable in the mid to distal transverse colon. This likely accounts for the additional air and fluid surrounding the stomach. Stable right lower lobe nodule consistent with Claborn Janusz benign etiology. Mild left basilar atelectasis. Cholelithiasis without complicating factors. Electronically Signed   By: Inez Catalina M.D.   On: 09/28/2020 11:23   DG Chest Portable 1 View  Result Date: 09/28/2020 CLINICAL DATA:  NG placement EXAM: PORTABLE CHEST 1 VIEW COMPARISON:  Earlier same day FINDINGS: New enteric tube passes into the distal stomach with tip looping back to the gastric body. Bowel gas pattern is not well evaluated. Left mid and lower lung opacities again noted. IMPRESSION: New enteric tube passes into the stomach. Electronically Signed   By: Macy Mis M.D.   On: 09/28/2020 15:09   DG Chest Port 1 View  Result Date: 09/28/2020 CLINICAL DATA:  Chest pain EXAM: PORTABLE CHEST 1 VIEW COMPARISON:  05/23/2018 FINDINGS: Mild cardiomegaly. Atherosclerotic calcification of the aortic knob. Patchy airspace opacity within the left mid lung and left lung base. Right lung is clear. Possible trace left pleural effusion. No pneumothorax. IMPRESSION: Patchy airspace opacity within the left mid lung and left lung base suspicious for pneumonia. Electronically Signed   By: Davina Poke D.O.   On: 09/28/2020 09:47        Scheduled Meds: . aspirin  81 mg Oral Daily  . Chlorhexidine Gluconate Cloth  6 each Topical Daily  . enoxaparin (LOVENOX) injection  40 mg Subcutaneous Q24H  . lip balm  1 application Topical BID  . mouth rinse  15 mL Mouth Rinse BID  . pantoprazole (PROTONIX) IV  40 mg Intravenous QHS   Continuous Infusions: . sodium chloride Stopped (09/28/20  1323)  . lactated ringers    . lactated ringers 50 mL/hr at 09/29/20 0800  . magnesium sulfate bolus IVPB    . methocarbamol (ROBAXIN) IV    . ondansetron (ZOFRAN) IV    . piperacillin-tazobactam (ZOSYN)  IV Stopped (09/29/20 0745)     LOS: 1 day    Time spent: over  30 min    Fayrene Helper, MD [facility]   To contact the attending provider between 7A-7P or the covering provider during after hours 7P-7A, please log into the web site [email or web address] and access using universal [place] password for that web site. If you do not have the password, please call the hospital operator.  09/29/2020, 9:09 AM

## 2020-09-29 NOTE — TOC Initial Note (Signed)
Transition of Care [facility]) - Initial/Assessment Note    Patient Details  Name: Molly Manning MRN: [id number] Date of Birth: 01/18/1941  [facility]) CM/SW Contact:    Leeroy Cha, RN Phone Number: 09/29/2020, 8:55 AM  Clinical Narrative:                 POST-OPERATIVE DIAGNOSIS:  GANGREONOUS PERFORATED APPENDICITIS INTRA ABDOMINAL & RETROPERITONEAL ABSESSES FECULENT PERITONITIS  PROCEDURE:   LAPAROSCOPIC DRAINAGE OF ABSESS X 2  LAPAROSCOPIC APPENDECTOMY DIAGNOSTIC LAPAROSCOPY WITH ABDOMINAL WASHOUT Iv zosyn, iv robaxin, iv zofran Plan is to return to home with self care. Expected Discharge Plan: Home/Self Care Barriers to Discharge: No Barriers Identified   Patient Goals and CMS Choice Patient states their goals for this hospitalization and ongoing recovery are:: to go home   Choice offered to / list presented to : Patient  Expected Discharge Plan and Services Expected Discharge Plan: Home/Self Care   Discharge Planning Services: CM Consult   Living arrangements for the past 2 months: Single Family Home                                      Prior Living Arrangements/Services Living arrangements for the past 2 months: Single Family Home Lives with:: Spouse Patient language and need for interpreter reviewed:: Yes Do you feel safe going back to the place where you live?: Yes      Need for Family Participation in Patient Care: Yes (Comment) Care giver support system in place?: Yes (comment)   Criminal Activity/Legal Involvement Pertinent to Current Situation/Hospitalization: No - Comment as needed  Activities of Daily Living Home Assistive Devices/Equipment: Eyeglasses ADL Screening (condition at time of admission) Patient's cognitive ability adequate to safely complete daily activities?: Yes Is the patient deaf or have difficulty hearing?: No Does the patient have difficulty seeing, even when wearing glasses/contacts?: Yes Does the  patient have difficulty concentrating, remembering, or making decisions?: No Patient able to express need for assistance with ADLs?: Yes Does the patient have difficulty dressing or bathing?: No Independently performs ADLs?: Yes (appropriate for developmental age) Does the patient have difficulty walking or climbing stairs?: No Weakness of Legs: None Weakness of Arms/Hands: None  Permission Sought/Granted                  Emotional Assessment Appearance:: Appears stated age Attitude/Demeanor/Rapport: Engaged Affect (typically observed): Calm Orientation: : Oriented to  Time, Oriented to Situation, Oriented to Place, Oriented to Self Alcohol / Substance Use: Not Applicable Psych Involvement: No (comment)  Admission diagnosis:  Pneumoperitoneum [K66.8] Acute appendicitis with generalized peritonitis, abscess, and gangrene [K35.21, K35.891] Patient Active Problem List   Diagnosis Date Noted  . Hypomagnesemia 09/29/2020  . Age-related osteoporosis without current pathological fracture 09/28/2020  . Atherosclerosis of coronary artery without angina pectoris 09/28/2020  . Arteriosclerosis of carotid artery 09/28/2020  . Family history of epilepsy and other diseases of the nervous system 09/28/2020  . Family history of malignant neoplasm of gastrointestinal tract 09/28/2020  . Gallstones 09/28/2020  . Hiatal hernia 09/28/2020  . History of tobacco use 09/28/2020  . Increased frequency of urination 09/28/2020  . Long term (current) use of aspirin 09/28/2020  . Microscopic hematuria 09/28/2020  . Multiple nodules of lung 09/28/2020  . Personal history of colonic polyps 09/28/2020  . Psoriasis 09/28/2020  . Pure hypercholesterolemia 09/28/2020  . Tinnitus 09/28/2020  . Pneumoperitoneum 09/28/2020  .  Gangrenous perforated appendicitis with generalized peritonitis & abscesses s/p lap appendectomy & washout 09/28/2020 09/28/2020  . Septic shock ([place]) 09/28/2020  . Acute  appendicitis with generalized peritonitis, abscess, and gangrene 09/28/2020   PCP:  Deland Pretty, MD Pharmacy:   [facility], [address] [place] [address] Phone: [phone] Fax: [phone]     Social Determinants of Health (SDOH) Interventions    Readmission Risk Interventions No flowsheet data found.

## 2020-09-29 NOTE — Progress Notes (Signed)
[facility] Progress Note  1 Day Post-Op  Subjective: CC-  States that she feels better than prior to surgery. Abdomen sore but pain well controlled. No flatus or BM. Has not gotten OOB yet since surgery. Foley removed earlier this morning.   Objective: Vital signs in last 24 hours: Temp:  [97.2 F (36.2 C)-98.4 F (36.9 C)] 98 F (36.7 C) (11/24 0328) Pulse Rate:  [102-118] 107 (11/24 0800) Resp:  [13-42] 21 (11/24 0800) BP: (96-160)/(55-112) 137/76 (11/24 0800) SpO2:  [84 %-100 %] 96 % (11/24 0800) Weight:  [70.3 kg-77 kg] 77 kg (11/24 0401) Last BM Date:  (PTA)  Intake/Output from previous day: 11/23 0701 - 11/24 0700 In: 6083.4 [I.V.:4952.4; IV Piggyback:1131] Out: 1040 [Urine:435; Emesis/NG output:175; Drains:355; Blood:75] Intake/Output this shift: Total I/O In: 104.1 [I.V.:85.6; IV Piggyback:18.5] Out: -   PE: Gen:  Alert, NAD, pleasant Card:  Mild tachy, regular rhythm Pulm:  CTAB, no W/R/R, rate and effort normal Abd: Soft, mild distension, appropriately tender mostly in the RLQ, hypoactive BS, lap incisions with cdi dressings, JP drain x2 with SS/cloudy fluid in bulbs Ext: calves soft and nontender Psych: A&Ox4  Skin: no rashes noted, warm and dry  Lab Results:  Recent Labs    09/28/20 0850 09/29/20 0232  WBC 10.9* 6.4  HGB 12.6 10.8*  HCT 37.3 32.9*  PLT 307 258   BMET Recent Labs    09/28/20 0850 09/29/20 0232  NA 138 140  K 3.9 4.0  CL 101 108  CO2 25 22  GLUCOSE 146* 112*  BUN 36* 34*  CREATININE 1.40* 0.95  CALCIUM 8.9 7.5*   PT/INR No results for input(s): LABPROT, INR in the last 72 hours. CMP     Component Value Date/Time   NA 140 09/29/2020 0232   K 4.0 09/29/2020 0232   CL 108 09/29/2020 0232   CO2 22 09/29/2020 0232   GLUCOSE 112 (H) 09/29/2020 0232   BUN 34 (H) 09/29/2020 0232   CREATININE 0.95 09/29/2020 0232   CALCIUM 7.5 (L) 09/29/2020 0232   PROT 7.7 09/28/2020 0850   ALBUMIN 3.2 (L) 09/28/2020 0850    AST 28 09/28/2020 0850   ALT 21 09/28/2020 0850   ALKPHOS 107 09/28/2020 0850   BILITOT 0.9 09/28/2020 0850   GFRNONAA >60 09/29/2020 0232   Lipase     Component Value Date/Time   LIPASE 25 09/28/2020 0850       Studies/Results: CT Abdomen Pelvis W Contrast  Result Date: 09/28/2020 CLINICAL DATA:  Abdominal distension following colonoscopy several days ago EXAM: CT ABDOMEN AND PELVIS WITH CONTRAST TECHNIQUE: Multidetector CT imaging of the abdomen and pelvis was performed using the standard protocol following bolus administration of intravenous contrast. CONTRAST:  11mL OMNIPAQUE IOHEXOL 300 MG/ML  SOLN COMPARISON:  07/16/2015 FINDINGS: Lower chest: Lung bases again demonstrate a stable right lower lobe nodule posteriorly which has been stable over several previous exams consistent with a benign etiology. Mild left lower lobe atelectatic changes are seen. Hepatobiliary: Dependent density is noted within the gallbladder likely related to a combination of sludge and multiple small stones. The liver is within normal limits. Pancreas: Unremarkable. No pancreatic ductal dilatation or surrounding inflammatory changes. Spleen: Normal in size without focal abnormality. Adrenals/Urinary Tract: Adrenal glands are within normal limits. Kidneys demonstrate a normal enhancement pattern bilaterally. A cyst is noted on the right inferiorly measuring 2.2 cm slightly larger than that seen on prior exam. Normal excretion is noted on delayed images. No renal calculi are  seen. No obstructive changes are noted. The bladder is within normal limits. Stomach/Bowel: Diverticulosis is noted throughout the colon with areas of pericolonic inflammatory change consistent with mild diverticulitis. Fluid and air are noted near the greater curvature of the stomach likely related to this diverticular inflammatory change. The appendix well visualized and demonstrates no evidence of appendicoliths. It is partially air-filled.  Surrounding the tip of the appendix, there is a focal air-fluid collection which measures approximately 3.4 x 4.2 cm in greatest dimension. These changes are suspicious for underlying appendiceal perforation with evolving abscess formation. Sliding-type hiatal hernia is noted with approximately 30% of the stomach within the chest. No definitive ulceration of the stomach is noted. The intervening small bowel is within normal limits. Vascular/Lymphatic: Diffuse atherosclerotic calcifications are noted. No significant lymphadenopathy is seen. Reproductive: Uterus demonstrates multiple uterine fibroids with calcification. No definitive adnexal mass is noted. Other: Free fluid is noted in the pelvic cul-de-sac Musculoskeletal: Degenerative changes of lumbar spine are seen. IMPRESSION: Air-fluid collection surrounding the tip of the appendix suggestive of distal appendiceal perforation and evolving abscess formation in the periappendiceal region. Diverticulosis is noted throughout the colon. There are some pericolonic areas of inflammatory change as well as foci of air consistent with diverticulitis. This is most notable in the mid to distal transverse colon. This likely accounts for the additional air and fluid surrounding the stomach. Stable right lower lobe nodule consistent with a benign etiology. Mild left basilar atelectasis. Cholelithiasis without complicating factors. Electronically Signed   By: Inez Catalina M.D.   On: 09/28/2020 11:23   DG Chest Portable 1 View  Result Date: 09/28/2020 CLINICAL DATA:  NG placement EXAM: PORTABLE CHEST 1 VIEW COMPARISON:  Earlier same day FINDINGS: New enteric tube passes into the distal stomach with tip looping back to the gastric body. Bowel gas pattern is not well evaluated. Left mid and lower lung opacities again noted. IMPRESSION: New enteric tube passes into the stomach. Electronically Signed   By: Macy Mis M.D.   On: 09/28/2020 15:09   DG Chest Port 1  View  Result Date: 09/28/2020 CLINICAL DATA:  Chest pain EXAM: PORTABLE CHEST 1 VIEW COMPARISON:  05/23/2018 FINDINGS: Mild cardiomegaly. Atherosclerotic calcification of the aortic knob. Patchy airspace opacity within the left mid lung and left lung base. Right lung is clear. Possible trace left pleural effusion. No pneumothorax. IMPRESSION: Patchy airspace opacity within the left mid lung and left lung base suspicious for pneumonia. Electronically Signed   By: Davina Poke D.O.   On: 09/28/2020 09:47    Anti-infectives: Anti-infectives (From admission, onward)   Start     Dose/Rate Route Frequency Ordered Stop   09/28/20 2000  piperacillin-tazobactam (ZOSYN) IVPB 3.375 g        3.375 g 12.5 mL/hr over 240 Minutes Intravenous Every 8 hours 09/28/20 1937     09/28/20 1830  clindamycin (CLEOCIN) 900 mg, gentamicin (GARAMYCIN) 240 mg in sodium chloride 0.9 % 1,000 mL for intraperitoneal lavage         Irrigation To Surgery 09/28/20 1736 09/28/20 1806   09/28/20 1630  clindamycin (CLEOCIN) 900 mg, gentamicin (GARAMYCIN) 240 mg in sodium chloride 0.9 % 1,000 mL for intraperitoneal lavage  Status:  Discontinued       Note to Pharmacy: Have in the  OR room for final irrigation in bowel surgery case to minimize risk of abscess/infection Pharmacy may adjust dosing strength, schedule, rate of infusion, etc as needed to optimize therapy    Irrigation To  Surgery 09/28/20 1618 09/28/20 1649   09/28/20 1600  cefoTEtan (CEFOTAN) 2 g in sodium chloride 0.9 % 100 mL IVPB        2 g 200 mL/hr over 30 Minutes Intravenous On call to O.R. 09/28/20 1320 09/28/20 1605   09/28/20 1145  piperacillin-tazobactam (ZOSYN) IVPB 3.375 g        3.375 g 100 mL/hr over 30 Minutes Intravenous  Once 09/28/20 1144 09/28/20 1353       Assessment/Plan HLD - restart home meds when taking PO OAB AKI - improved, Cr 0.95 ABL anemia - Hgb 10.8 from 12.6, recheck CBC in AM Severe protein calorie malnutrition - prealbumin  <5 (11/24). If unable to advance diet over the next few days may need to consider starting TPN Hypomagnesemia - replete today and recheck [place], INTRA ABDOMINAL & RETROPERITONEAL ABSESSES, FECULENT PERITONITIS -S/p LAPAROSCOPIC DRAINAGE OF ABSESS X 2 , LAPAROSCOPIC APPENDECTOMY, DIAGNOSTIC LAPAROSCOPY WITH ABDOMINAL WASHOUT 11/23 Dr. Johney Maine - POD#1 - surgical path pending - needs at least 5 days of antibiotics postop - continue JP drain x2 and monitor output - continue NPO/NGT and await return in bowel function. As above, if unable to advance diet over the next few days may need to consider starting TPN - needs to mobilize, will also ask PT/OT to see - add IV tylenol for better pain control  ID - zosyn 11/23>> VTE - SCDs, lovenox  FEN - IVF, NPO/NGT Foley - removed 11/24 Follow up - Dr. Johney Maine   LOS: 1 day    Wellington [place], [facility] 09/29/2020, 9:01 AM Please see Amion for pager number during day hours 7:00am-4:30pm

## 2020-09-30 ENCOUNTER — Encounter (HOSPITAL_COMMUNITY): Payer: Self-pay

## 2020-09-30 ENCOUNTER — Inpatient Hospital Stay (HOSPITAL_COMMUNITY): Payer: Medicare Other

## 2020-09-30 DIAGNOSIS — K3521 Acute appendicitis with generalized peritonitis, with abscess: Secondary | ICD-10-CM | POA: Diagnosis not present

## 2020-09-30 LAB — CBC
HCT: 30.4 % — ABNORMAL LOW (ref 36.0–46.0)
Hemoglobin: 10.1 g/dL — ABNORMAL LOW (ref 12.0–15.0)
MCH: 31.1 pg (ref 26.0–34.0)
MCHC: 33.2 g/dL (ref 30.0–36.0)
MCV: 93.5 fL (ref 80.0–100.0)
Platelets: 254 10*3/uL (ref 150–400)
RBC: 3.25 MIL/uL — ABNORMAL LOW (ref 3.87–5.11)
RDW: 13.2 % (ref 11.5–15.5)
WBC: 11.7 10*3/uL — ABNORMAL HIGH (ref 4.0–10.5)
nRBC: 0 % (ref 0.0–0.2)

## 2020-09-30 LAB — BASIC METABOLIC PANEL
Anion gap: 9 (ref 5–15)
BUN: 45 mg/dL — ABNORMAL HIGH (ref 8–23)
CO2: 24 mmol/L (ref 22–32)
Calcium: 8.1 mg/dL — ABNORMAL LOW (ref 8.9–10.3)
Chloride: 108 mmol/L (ref 98–111)
Creatinine, Ser: 0.95 mg/dL (ref 0.44–1.00)
GFR, Estimated: >60 mL/min (ref >60)
Glucose, Bld: 115 mg/dL — ABNORMAL HIGH (ref 70–99)
Potassium: 3.8 mmol/L (ref 3.5–5.1)
Sodium: 141 mmol/L (ref 135–145)

## 2020-09-30 LAB — HEPATIC FUNCTION PANEL
ALT: 71 U/L — ABNORMAL HIGH (ref 0–44)
AST: 123 U/L — ABNORMAL HIGH (ref 15–41)
Albumin: 2.1 g/dL — ABNORMAL LOW (ref 3.5–5.0)
Alkaline Phosphatase: 87 U/L (ref 38–126)
Bilirubin, Direct: 0.4 mg/dL — ABNORMAL HIGH (ref 0.0–0.2)
Indirect Bilirubin: 0.6 mg/dL (ref 0.3–0.9)
Total Bilirubin: 1 mg/dL (ref 0.3–1.2)
Total Protein: 5.7 g/dL — ABNORMAL LOW (ref 6.5–8.1)

## 2020-09-30 LAB — FOLATE: Folate: 17 ng/mL (ref >5.9)

## 2020-09-30 LAB — VITAMIN B12: Vitamin B-12: 3926 pg/mL — ABNORMAL HIGH (ref 180–914)

## 2020-09-30 LAB — PHOSPHORUS: Phosphorus: 3.7 mg/dL (ref 2.5–4.6)

## 2020-09-30 LAB — IRON AND TIBC
Iron: 26 ug/dL — ABNORMAL LOW (ref 28–170)
Saturation Ratios: 19 % (ref 10.4–31.8)
TIBC: 135 ug/dL — ABNORMAL LOW (ref 250–450)
UIBC: 109 ug/dL

## 2020-09-30 LAB — MAGNESIUM: Magnesium: 2.6 mg/dL — ABNORMAL HIGH (ref 1.7–2.4)

## 2020-09-30 LAB — FERRITIN: Ferritin: 768 ng/mL — ABNORMAL HIGH (ref 11–307)

## 2020-09-30 NOTE — Evaluation (Signed)
Physical Therapy Evaluation Patient Details Name: Molly Manning MRN: [id number] DOB: May 20, 1941 Today's Date: 09/30/2020   History of Present Illness  79 y.o. female admitted with gangrenous perforated appendicitis & abscess. s/p laparoscopic appendectomy and washout 09/28/20. PMH of HLD.  Clinical Impression  Pt admitted with above diagnosis. Pt ambulated 110' with RW, no loss of balance. Min/mod assist for bed mobility. Good progress expected.  Pt currently with functional limitations due to the deficits listed below (see PT Problem List). Pt will benefit from skilled PT to increase their independence and safety with mobility to allow discharge to the venue listed below.       Follow Up Recommendations No PT follow up    Equipment Recommendations  Rolling walker with 5" wheels    Recommendations for Other Services       Precautions / Restrictions Precautions Precautions: Other (comment) Precaution Comments: abdominal surgery, instructed pt in log roll Restrictions Weight Bearing Restrictions: No      Mobility  Bed Mobility Overal bed mobility: Needs Assistance Bed Mobility: Rolling;Sidelying to Sit Rolling: Min assist Sidelying to sit: Mod assist       General bed mobility comments: min A to log roll, mod A to raise trunk    Transfers Overall transfer level: Needs assistance Equipment used: Rolling walker (2 wheeled) Transfers: Sit to/from Stand Sit to Stand: Min assist         General transfer comment: VCs hand placement, min A to power up  Ambulation/Gait Ambulation/Gait assistance: Min guard Gait Distance (Feet): 110 Feet Assistive device: Rolling walker (2 wheeled) Gait Pattern/deviations: Step-through pattern;Decreased stride length Gait velocity: WFL   General Gait Details: steady with RW, no loss of balance  Stairs            Wheelchair Mobility    Modified Rankin (Stroke Patients Only)       Balance Overall balance assessment:  Modified Independent                                           Pertinent Vitals/Pain Pain Assessment: 0-10 Pain Score: 2  Pain Location: abdomen with walking Pain Descriptors / Indicators: Sore Pain Intervention(s): Limited activity within patient's tolerance;Monitored during session    Home Living Family/patient expects to be discharged to:: Private residence Living Arrangements: Spouse/significant other Available Help at Discharge: Family Type of Home: House Home Access: Level entry     Home Layout: Two level;Able to live on main level with bedroom/bathroom Home Equipment: Gilford Rile - 2 wheels      Prior Function Level of Independence: Independent         Comments: played golf and tennis     Hand Dominance   Dominant Hand: Right    Extremity/Trunk Assessment   Upper Extremity Assessment Upper Extremity Assessment: Defer to OT evaluation    Lower Extremity Assessment Lower Extremity Assessment: Overall WFL for tasks assessed    Cervical / Trunk Assessment Cervical / Trunk Assessment: Normal  Communication   Communication: HOH  Cognition Arousal/Alertness: Awake/alert Behavior During Therapy: WFL for tasks assessed/performed Overall Cognitive Status: Within Functional Limits for tasks assessed                                        General Comments      Exercises General  Exercises - Lower Extremity Ankle Circles/Pumps: AROM;Both;10 reps;Seated   Assessment/Plan    PT Assessment Patient needs continued PT services  PT Problem List Decreased activity tolerance;Decreased mobility;Pain       PT Treatment Interventions Gait training;Patient/family education;Therapeutic activities    PT Goals (Current goals can be found in the Care Plan section)  Acute Rehab PT Goals Patient Stated Goal: tennis, golf PT Goal Formulation: With patient/family Time For Goal Achievement: 10/14/20 Potential to Achieve Goals: Good     Frequency Min 3X/week   Barriers to discharge        Co-evaluation               AM-PAC PT "6 Clicks" Mobility  Outcome Measure Help needed turning from your back to your side while in a flat bed without using bedrails?: A Little Help needed moving from lying on your back to sitting on the side of a flat bed without using bedrails?: A Lot Help needed moving to and from a bed to a chair (including a wheelchair)?: A Little Help needed standing up from a chair using your arms (e.g., wheelchair or bedside chair)?: A Little Help needed to walk in hospital room?: A Little Help needed climbing 3-5 steps with a railing? : A Little 6 Click Score: 17    End of Session   Activity Tolerance: Patient tolerated treatment well Patient left: in chair;with call bell/phone within reach;with chair alarm set;with family/visitor present Nurse Communication: Mobility status PT Visit Diagnosis: Difficulty in walking, not elsewhere classified (R26.2);Pain    Time: 2527-1292 PT Time Calculation (min) (ACUTE ONLY): 19 min   Charges:   PT Evaluation $PT Eval Low Complexity: 1 Low         Blondell Reveal Kistler PT 09/30/2020  Acute Rehabilitation Services Pager [phone] Office [phone]

## 2020-09-30 NOTE — Progress Notes (Signed)
2 Days Post-Op   Subjective/Chief Complaint: Comfortable Denies SOB No flatus yet   Objective: Vital signs in last 24 hours: Temp:  [97.5 F (36.4 C)-98.4 F (36.9 C)] 97.7 F (36.5 C) (11/25 0450) Pulse Rate:  [83-106] 83 (11/25 0450) Resp:  [16-36] 16 (11/25 0450) BP: (111-134)/(59-80) 134/71 (11/25 0450) SpO2:  [91 %-97 %] 91 % (11/25 0450) Weight:  [78.1 kg] 78.1 kg (11/25 0342) Last BM Date:  (PTA)  Intake/Output from previous day: 11/24 0701 - 11/25 0700 In: 2883.8 [I.V.:2313.5; IV Piggyback:570.3] Out: 1272.5 [Urine:700; Emesis/NG output:275; Drains:297.5] Intake/Output this shift: No intake/output data recorded.  Exam: Awake and alert Abdomen obese, mildly full, drains seropurulent  Lab Results:  Recent Labs    09/29/20 0232 09/30/20 0320  WBC 6.4 11.7*  HGB 10.8* 10.1*  HCT 32.9* 30.4*  PLT 258 254   BMET Recent Labs    09/29/20 0232 09/30/20 0320  NA 140 141  K 4.0 3.8  CL 108 108  CO2 22 24  GLUCOSE 112* 115*  BUN 34* 45*  CREATININE 0.95 0.95  CALCIUM 7.5* 8.1*   PT/INR No results for input(s): LABPROT, INR in the last 72 hours. ABG No results for input(s): PHART, HCO3 in the last 72 hours.  Invalid input(s): PCO2, PO2  Studies/Results: CT Abdomen Pelvis W Contrast  Result Date: 09/28/2020 CLINICAL DATA:  Abdominal distension following colonoscopy several days ago EXAM: CT ABDOMEN AND PELVIS WITH CONTRAST TECHNIQUE: Multidetector CT imaging of the abdomen and pelvis was performed using the standard protocol following bolus administration of intravenous contrast. CONTRAST:  56mL OMNIPAQUE IOHEXOL 300 MG/ML  SOLN COMPARISON:  07/16/2015 FINDINGS: Lower chest: Lung bases again demonstrate a stable right lower lobe nodule posteriorly which has been stable over several previous exams consistent with a benign etiology. Mild left lower lobe atelectatic changes are seen. Hepatobiliary: Dependent density is noted within the gallbladder likely  related to a combination of sludge and multiple small stones. The liver is within normal limits. Pancreas: Unremarkable. No pancreatic ductal dilatation or surrounding inflammatory changes. Spleen: Normal in size without focal abnormality. Adrenals/Urinary Tract: Adrenal glands are within normal limits. Kidneys demonstrate a normal enhancement pattern bilaterally. A cyst is noted on the right inferiorly measuring 2.2 cm slightly larger than that seen on prior exam. Normal excretion is noted on delayed images. No renal calculi are seen. No obstructive changes are noted. The bladder is within normal limits. Stomach/Bowel: Diverticulosis is noted throughout the colon with areas of pericolonic inflammatory change consistent with mild diverticulitis. Fluid and air are noted near the greater curvature of the stomach likely related to this diverticular inflammatory change. The appendix well visualized and demonstrates no evidence of appendicoliths. It is partially air-filled. Surrounding the tip of the appendix, there is a focal air-fluid collection which measures approximately 3.4 x 4.2 cm in greatest dimension. These changes are suspicious for underlying appendiceal perforation with evolving abscess formation. Sliding-type hiatal hernia is noted with approximately 30% of the stomach within the chest. No definitive ulceration of the stomach is noted. The intervening small bowel is within normal limits. Vascular/Lymphatic: Diffuse atherosclerotic calcifications are noted. No significant lymphadenopathy is seen. Reproductive: Uterus demonstrates multiple uterine fibroids with calcification. No definitive adnexal mass is noted. Other: Free fluid is noted in the pelvic cul-de-sac Musculoskeletal: Degenerative changes of lumbar spine are seen. IMPRESSION: Air-fluid collection surrounding the tip of the appendix suggestive of distal appendiceal perforation and evolving abscess formation in the periappendiceal region.  Diverticulosis is noted throughout the  colon. There are some pericolonic areas of inflammatory change as well as foci of air consistent with diverticulitis. This is most notable in the mid to distal transverse colon. This likely accounts for the additional air and fluid surrounding the stomach. Stable right lower lobe nodule consistent with a benign etiology. Mild left basilar atelectasis. Cholelithiasis without complicating factors. Electronically Signed   By: Inez Catalina M.D.   On: 09/28/2020 11:23   DG CHEST PORT 1 VIEW  Result Date: 09/30/2020 CLINICAL DATA:  Evaluate for pneumonia EXAM: PORTABLE CHEST 1 VIEW COMPARISON:  09/28/2020 FINDINGS: There is a nasogastric tube with tip and side port below the level of the GE junction. Heart size is normal. Aortic atherosclerotic calcifications. Small bilateral pleural effusions, unchanged. Decreased aeration of both lung bases compatible with atelectasis and or pneumonia. New bandlike area of atelectasis within the left midlung. IMPRESSION: 1. Persistent small pleural effusions with decreased aeration of both lower lung zones. 2. New bandlike area of atelectasis within the left midlung. Electronically Signed   By: Kerby Moors M.D.   On: 09/30/2020 07:25   DG Chest Portable 1 View  Result Date: 09/28/2020 CLINICAL DATA:  NG placement EXAM: PORTABLE CHEST 1 VIEW COMPARISON:  Earlier same day FINDINGS: New enteric tube passes into the distal stomach with tip looping back to the gastric body. Bowel gas pattern is not well evaluated. Left mid and lower lung opacities again noted. IMPRESSION: New enteric tube passes into the stomach. Electronically Signed   By: Macy Mis M.D.   On: 09/28/2020 15:09   DG Chest Port 1 View  Result Date: 09/28/2020 CLINICAL DATA:  Chest pain EXAM: PORTABLE CHEST 1 VIEW COMPARISON:  05/23/2018 FINDINGS: Mild cardiomegaly. Atherosclerotic calcification of the aortic knob. Patchy airspace opacity within the left mid lung  and left lung base. Right lung is clear. Possible trace left pleural effusion. No pneumothorax. IMPRESSION: Patchy airspace opacity within the left mid lung and left lung base suspicious for pneumonia. Electronically Signed   By: Davina Poke D.O.   On: 09/28/2020 09:47    Anti-infectives: Anti-infectives (From admission, onward)   Start     Dose/Rate Route Frequency Ordered Stop   09/29/20 2200  piperacillin-tazobactam (ZOSYN) IVPB 3.375 g        3.375 g 12.5 mL/hr over 240 Minutes Intravenous Every 8 hours 09/29/20 1524 10/03/20 2159   09/28/20 2000  piperacillin-tazobactam (ZOSYN) IVPB 3.375 g  Status:  Discontinued        3.375 g 12.5 mL/hr over 240 Minutes Intravenous Every 8 hours 09/28/20 1937 09/29/20 1524   09/28/20 1830  clindamycin (CLEOCIN) 900 mg, gentamicin (GARAMYCIN) 240 mg in sodium chloride 0.9 % 1,000 mL for intraperitoneal lavage         Irrigation To Surgery 09/28/20 1736 09/28/20 1806   09/28/20 1630  clindamycin (CLEOCIN) 900 mg, gentamicin (GARAMYCIN) 240 mg in sodium chloride 0.9 % 1,000 mL for intraperitoneal lavage  Status:  Discontinued       Note to Pharmacy: Have in the  [place] room for final irrigation in bowel surgery case to minimize risk of abscess/infection Pharmacy may adjust dosing strength, schedule, rate of infusion, etc as needed to optimize therapy    Irrigation To Surgery 09/28/20 1618 09/28/20 1649   09/28/20 1600  cefoTEtan (CEFOTAN) 2 g in sodium chloride 0.9 % 100 mL IVPB        2 g 200 mL/hr over 30 Minutes Intravenous On call to O.R. 09/28/20 1320 09/28/20 1605  09/28/20 1145  piperacillin-tazobactam (ZOSYN) IVPB 3.375 g        3.375 g 100 mL/hr over 30 Minutes Intravenous  Once 09/28/20 1144 09/28/20 1353      Assessment/Plan: s/p Procedure(s): DIAGNOSTIC LAPAROSCOPY;  LAPAROSCOPIC DRAINAGE OF ABSESS X 2 LAPAROSCOPIC APPENDECTOMY (N/A)  HLD - restart home meds when taking PO OAB AKI - improved, Cr 0.95, GFR normal ABL anemia - Hgb  stable this morning Severe protein calorie malnutrition - prealbumin <5 (11/24). If unable to advance diet over the next few days may need to consider starting TPN   GANGREONOUSPERFORATEDAPPENDICITIS, INTRA ABDOMINAL& RETROPERITONEALABSESSES, FECULENT PERITONITIS -S/p LAPAROSCOPIC DRAINAGE OF ABSESS X 2 , LAPAROSCOPIC APPENDECTOMY, DIAGNOSTIC LAPAROSCOPYWITH ABDOMINAL WASHOUT 11/23 Dr. Johney Maine  Will continue NG and antibiotics Appreciate Hospitalist's assistance Leave drains in place  LOS: 2 days    Coralie Keens MD 09/30/2020

## 2020-09-30 NOTE — Evaluation (Signed)
Occupational Therapy Evaluation Patient Details Name: Molly Manning MRN: [id number] DOB: 03/05/1941 Today's Date: 09/30/2020    History of Present Illness 79 y.o. female admitted with gangrenous perforated appendicitis & abscess. s/p laparoscopic appendectomy and washout 09/28/20. PMH of HLD.   Clinical Impression   Patient lives at home with spouse and is independent at baseline, likes to play golf and tennis. Patient instructed in log roll technique for OOB mobility, require min A for rolling and mod A to elevate trunk to sitting. Patient min A to power up to standing and able to ambulate with walker min G. Attempted figure 4 seated in recliner for LB dressing however patient reports too much pain. Recommend continued acute OT services to maximize safety and independence with self care in order to facilitate D/C to venue listed below.    Follow Up Recommendations  No OT follow up;Supervision - Intermittent    Equipment Recommendations  Tub/shower seat       Precautions / Restrictions Precautions Precautions: Other (comment) Precaution Comments: abdominal surgery, instructed pt in log roll, NG tube, 2 drains Restrictions Weight Bearing Restrictions: No      Mobility Bed Mobility Overal bed mobility: Needs Assistance Bed Mobility: Rolling;Sidelying to Sit Rolling: Min assist Sidelying to sit: Mod assist       General bed mobility comments: min A to log roll, mod A to raise trunk    Transfers Overall transfer level: Needs assistance Equipment used: Rolling walker (2 wheeled) Transfers: Sit to/from Stand Sit to Stand: Min assist         General transfer comment: VCs hand placement, min A to power up    Balance Overall balance assessment: Needs assistance Sitting-balance support: Feet supported Sitting balance-Leahy Scale: Fair     Standing balance support: Bilateral upper extremity supported Standing balance-Leahy Scale: Poor Standing balance comment: reliant  on external assistance                           ADL either performed or assessed with clinical judgement   ADL Overall ADL's : Needs assistance/impaired Eating/Feeding: NPO   Grooming: Brushing hair;Set up;Sitting   Upper Body Bathing: Set up;Sitting   Lower Body Bathing: Maximal assistance;Sitting/lateral leans   Upper Body Dressing : Set up;Sitting   Lower Body Dressing: Maximal assistance;Sitting/lateral leans Lower Body Dressing Details (indicate cue type and reason): patient unable to perform figure 4 method without increased pain, may benefit from [facility] AE education Toilet Transfer: Minimal assistance;Ambulation;RW Toilet Transfer Details (indicate cue type and reason): simulated with functional mobility and transfer to recliner, min A initially to power up to standing Toileting- Clothing Manipulation and Hygiene: Minimal assistance;Sit to/from stand;Sitting/lateral lean       Functional mobility during ADLs: Minimal assistance;Rolling walker General ADL Comments: patient requiring increased assistance with self care due to decreased activity tolerance, balance and pain                   Pertinent Vitals/Pain Pain Assessment: Faces Pain Score: 2  Faces Pain Scale: Hurts little more Pain Location: abdomen with walking Pain Descriptors / Indicators: Sore Pain Intervention(s): Monitored during session     Hand Dominance Right   Extremity/Trunk Assessment Upper Extremity Assessment Upper Extremity Assessment: Overall WFL for tasks assessed   Lower Extremity Assessment Lower Extremity Assessment: Defer to PT evaluation   Cervical / Trunk Assessment Cervical / Trunk Assessment: Normal   Communication Communication Communication: HOH   Cognition Arousal/Alertness: Awake/alert Behavior  During Therapy: WFL for tasks assessed/performed Overall Cognitive Status: Within Functional Limits for tasks assessed                                         Exercises General Exercises - Lower Extremity Ankle Circles/Pumps: AROM;Both;10 reps;Seated        Home Living Family/patient expects to be discharged to:: Private residence Living Arrangements: Spouse/significant other Available Help at Discharge: Family Type of Home: House Home Access: Level entry     Home Layout: Two level;Able to live on main level with bedroom/bathroom     Bathroom Shower/Tub: Walk-in shower   Bathroom Toilet: Handicapped height     Home Equipment: Environmental consultant - 2 wheels          Prior Functioning/Environment Level of Independence: Independent        Comments: played golf and tennis        OT Problem List: Decreased activity tolerance;Impaired balance (sitting and/or standing);Pain;Decreased safety awareness;Decreased knowledge of precautions      OT Treatment/Interventions: Self-care/ADL training;DME and/or AE instruction;Energy conservation;Therapeutic activities;Patient/family education;Balance training    OT Goals(Current goals can be found in the care plan section) Acute Rehab OT Goals Patient Stated Goal: tennis, golf OT Goal Formulation: With patient Time For Goal Achievement: 10/14/20 Potential to Achieve Goals: Good  OT Frequency: Min 2X/week    AM-PAC OT "6 Clicks" Daily Activity     Outcome Measure Help from another person eating meals?: None Help from another person taking care of personal grooming?: A Little Help from another person toileting, which includes using toliet, bedpan, or urinal?: A Little Help from another person bathing (including washing, rinsing, drying)?: A Lot Help from another person to put on and taking off regular upper body clothing?: A Little Help from another person to put on and taking off regular lower body clothing?: A Lot 6 Click Score: 17   End of Session Equipment Utilized During Treatment: Rolling walker Nurse Communication: Mobility status  Activity Tolerance: Patient tolerated treatment  well Patient left: in chair;with call bell/phone within reach;with chair alarm set;with family/visitor present  OT Visit Diagnosis: Pain Pain - part of body:  (abdomen)                Time: 7782-4235 OT Time Calculation (min): 16 min Charges:  OT General Charges $OT Visit: 1 Visit OT Evaluation $OT Eval Low Complexity: 1 Low  Delbert Phenix OT OT pager: [place] 09/30/2020, 12:19 PM

## 2020-09-30 NOTE — Progress Notes (Signed)
Consult NOTE    Molly Manning  JIR:[id number] DOB: Dec 03, 1940 DOA: 09/28/2020 PCP: Deland Pretty, MD   Chief Complaint  Patient presents with  . Chest Pain  . Abdominal Pain    Brief Narrative: Molly Manning is Molly Manning 79 y.o. female with medical history significant of HLD, overactive bladder. She presented on 11/23 with abdominal pain and was found to have CT imaging concerning for perforated appendicitis.  She's now s/p laparoscopic appendectomy with drainage of abscess and abdominal washout by surgery.  TRH was consulted for medical management.   Assessment & Plan:   Principal Problem:   Gangrenous perforated appendicitis with generalized peritonitis & abscesses s/p lap appendectomy & washout 09/28/2020 Active Problems:   Atherosclerosis of coronary artery without angina pectoris   Hiatal hernia   Multiple nodules of lung   Psoriasis   Pure hypercholesterolemia   Pneumoperitoneum   Septic shock (HCC)   Acute appendicitis with generalized peritonitis, abscess, and gangrene   Hypomagnesemia  Sepsis secondary to gangrenous perforated appendicitis  Intraabdominal and Retroperitoneal Abscesses  Feculent Peritonitis     - per primary team     - s/p laparoscopic drainage of abscess x2, laparoscopic appendectomy, diagnostic laparoscopy with abdominal washout on 11/23     - follow surg path - pending     - NGT in place, NPO - ice chips ok, per discussion with surgery     - continue abx and IVF     - therapy eval  Acute Hypoxic Respiratory Failure  Community Acquired Pneumonia     - currently on 2 L oxygen, CXR from 11/23 with patchy airspace opacities within L mid lung and L lung base concerning for pneumonia      - continue abx as above      - repeat CXR 11/25 -> persistent small effusions with decreased aerations of both lower lung zones - bandlike atelectasis within L midlung      - continue IS  Elevated LFTs: continue to monitor post op, unclear etiology  Normocytic Anemia   Iron Def Anemia       - likely dilutional, possible component of blood loss from surgery, continue to monitor - labs c/w IDA  HLD     - hold crestor while NPO  Overactive bladder     - hold myrbetriq while NPO   Primary Prevention     - hold ASA while NPO  DVT prophylaxis: lovenox Code Status: full  Family Communication: husband at bedside Disposition:   Status is: Inpatient  Remains inpatient appropriate because:Inpatient level of care appropriate due to severity of illness   Dispo: The patient is from: Home              Anticipated d/c is to: pending              Anticipated d/c date is: > 3 days              Patient currently is not medically stable to d/c.       Consultants:   Surgery is primary  [place] is consulting  Procedures:  s/p laparoscopic drainage of abscess x2, laparoscopic appendectomy, diagnostic laparoscopy with abdominal washout on 11/23  Antimicrobials: Anti-infectives (From admission, onward)   Start     Dose/Rate Route Frequency Ordered Stop   09/29/20 2200  piperacillin-tazobactam (ZOSYN) IVPB 3.375 g        3.375 g 12.5 mL/hr over 240 Minutes Intravenous Every 8 hours 09/29/20 1524 10/03/20 2159   09/28/20 2000  piperacillin-tazobactam (ZOSYN) IVPB 3.375 g  Status:  Discontinued        3.375 g 12.5 mL/hr over 240 Minutes Intravenous Every 8 hours 09/28/20 1937 09/29/20 1524   09/28/20 1830  clindamycin (CLEOCIN) 900 mg, gentamicin (GARAMYCIN) 240 mg in sodium chloride 0.9 % 1,000 mL for intraperitoneal lavage         Irrigation To Surgery 09/28/20 1736 09/28/20 1806   09/28/20 1630  clindamycin (CLEOCIN) 900 mg, gentamicin (GARAMYCIN) 240 mg in sodium chloride 0.9 % 1,000 mL for intraperitoneal lavage  Status:  Discontinued       Note to Pharmacy: Have in the  OR room for final irrigation in bowel surgery case to minimize risk of abscess/infection Pharmacy may adjust dosing strength, schedule, rate of infusion, etc as needed to optimize  therapy    Irrigation To Surgery 09/28/20 1618 09/28/20 1649   09/28/20 1600  cefoTEtan (CEFOTAN) 2 g in sodium chloride 0.9 % 100 mL IVPB        2 g 200 mL/hr over 30 Minutes Intravenous On call to O.R. 09/28/20 1320 09/28/20 1605   09/28/20 1145  piperacillin-tazobactam (ZOSYN) IVPB 3.375 g        3.375 g 100 mL/hr over 30 Minutes Intravenous  Once 09/28/20 1144 09/28/20 1353     Subjective: No flatus Persistent abdominal discomfort  Objective: Vitals:   09/29/20 2017 09/30/20 0017 09/30/20 0342 09/30/20 0450  BP: 130/76 127/80  134/71  Pulse: 91 89  83  Resp: 17 17  16   Temp: 97.7 F (36.5 C) (!) 97.5 F (36.4 C)  97.7 F (36.5 C)  TempSrc: Oral Oral  Oral  SpO2: 97% 97%  91%  Weight:   78.1 kg   Height:        Intake/Output Summary (Last 24 hours) at 09/30/2020 0907 Last data filed at 09/30/2020 0600 Gross per 24 hour  Intake 2779.68 ml  Output 1272.5 ml  Net 1507.18 ml   Filed Weights   09/28/20 2023 09/29/20 0401 09/30/20 0342  Weight: 76.9 kg 77 kg 78.1 kg    Examination:  General: No acute distress. Cardiovascular: Heart sounds show [employer] regular rate, and rhythm.  Lungs: Clear to auscultation bilaterally  Abdomen: Soft, appropriately tender, mildly distended. Neurological: Alert and oriented 3. Moves all extremities 4 with equal strength. Cranial nerves II through XII grossly intact. Skin: Warm and dry. No rashes or lesions. Extremities: No clubbing or cyanosis. No edema    Data Reviewed: I have personally reviewed following labs and imaging studies  CBC: Recent Labs  Lab 09/28/20 0850 09/29/20 0232 09/30/20 0320  WBC 10.9* 6.4 11.7*  HGB 12.6 10.8* 10.1*  HCT 37.3 32.9* 30.4*  MCV 92.1 94.3 93.5  PLT 307 258 096    Basic Metabolic Panel: Recent Labs  Lab 09/28/20 0850 09/29/20 0232 09/30/20 0320  NA 138 140 141  K 3.9 4.0 3.8  CL 101 108 108  CO2 25 22 24   GLUCOSE 146* 112* 115*  BUN 36* 34* 45*  CREATININE 1.40* 0.95 0.95    CALCIUM 8.9 7.5* 8.1*  MG  --  1.7 2.6*  PHOS  --  3.2 3.7    GFR: Estimated Creatinine Clearance: 51.5 mL/min (by C-G formula based on SCr of 0.95 mg/dL).  Liver Function Tests: Recent Labs  Lab 09/28/20 0850 09/30/20 0320  AST 28 123*  ALT 21 71*  ALKPHOS 107 87  BILITOT 0.9 1.0  PROT 7.7 5.7*  ALBUMIN 3.2* 2.1*  CBG: No results for input(s): GLUCAP in the last 168 hours.   Recent Results (from the past 240 hour(s))  Resp Panel by RT-PCR (Flu Vanesha Athens&B, Covid) Nasopharyngeal Swab     Status: None   Collection Time: 09/28/20  9:54 AM   Specimen: Nasopharyngeal Swab; Nasopharyngeal(NP) swabs in vial transport medium  Result Value Ref Range Status   SARS Coronavirus 2 by RT PCR NEGATIVE NEGATIVE Final    Comment: (NOTE) SARS-CoV-2 target nucleic acids are NOT DETECTED.  The SARS-CoV-2 RNA is generally detectable in upper respiratory specimens during the acute phase of infection. The lowest concentration of SARS-CoV-2 viral copies this assay can detect is 138 copies/mL. Kellar Westberg negative result does not preclude SARS-Cov-2 infection and should not be used as the sole basis for treatment or other patient management decisions. Masud Holub negative result may occur with  improper specimen collection/handling, submission of specimen other than nasopharyngeal swab, presence of viral mutation(s) within the areas targeted by this assay, and inadequate number of viral copies(<138 copies/mL). Dalonte Hardage negative result must be combined with clinical observations, patient history, and epidemiological information. The expected result is Negative.  Fact Sheet for Patients:  EntrepreneurPulse.com.au  Fact Sheet for Healthcare Providers:  IncredibleEmployment.be  This test is no t yet approved or cleared by the [place] FDA and  has been authorized for detection and/or diagnosis of SARS-CoV-2 by FDA under an Emergency Use Authorization (EUA). This EUA will remain   in effect (meaning this test can be used) for the duration of the COVID-19 declaration under Section 564(b)(1) of the Act, 21 U.S.C.section 360bbb-3(b)(1), unless the authorization is terminated  or revoked sooner.       Influenza Tempest [place] by PCR NEGATIVE NEGATIVE Final   Influenza B by PCR NEGATIVE NEGATIVE Final    Comment: (NOTE) The Xpert Xpress SARS-CoV-2/FLU/RSV plus assay is intended as an aid in the diagnosis of influenza from Nasopharyngeal swab specimens and should not be used as Kataya Guimont sole basis for treatment. Nasal washings and aspirates are unacceptable for Xpert Xpress SARS-CoV-2/FLU/RSV testing.  Fact Sheet for Patients: EntrepreneurPulse.com.au  Fact Sheet for Healthcare Providers: IncredibleEmployment.be  This test is not yet approved or cleared by the [place] FDA and has been authorized for detection and/or diagnosis of SARS-CoV-2 by FDA under an Emergency Use Authorization (EUA). This EUA will remain in effect (meaning this test can be used) for the duration of the COVID-19 declaration under Section 564(b)(1) of the Act, 21 U.S.C. section 360bbb-3(b)(1), unless the authorization is terminated or revoked.  Performed at [facility], [place] [address], [place], [place] [address]   MRSA PCR Screening     Status: None   Collection Time: 09/28/20  8:15 PM   Specimen: Nasopharyngeal Wash  Result Value Ref Range Status   MRSA by PCR NEGATIVE NEGATIVE Final    Comment:        The GeneXpert MRSA Assay (FDA approved for NASAL specimens only), is one component of Lismary Kiehn comprehensive MRSA colonization surveillance program. It is not intended to diagnose MRSA infection nor to guide or monitor treatment for MRSA infections. Performed at [facility], [place] [address] [place], [place] [address]          Radiology Studies: CT Abdomen Pelvis W Contrast  Result Date: 09/28/2020 CLINICAL DATA:   Abdominal distension following colonoscopy several days ago EXAM: CT ABDOMEN AND PELVIS WITH CONTRAST TECHNIQUE: Multidetector CT imaging of the abdomen and pelvis was performed using the standard protocol following bolus administration  of intravenous contrast. CONTRAST:  66mL OMNIPAQUE IOHEXOL 300 MG/ML  SOLN COMPARISON:  07/16/2015 FINDINGS: Lower chest: Lung bases again demonstrate Ferrah Panagopoulos stable right lower lobe nodule posteriorly which has been stable over several previous exams consistent with Maleeha Halls benign etiology. Mild left lower lobe atelectatic changes are seen. Hepatobiliary: Dependent density is noted within the gallbladder likely related to Posey Jasmin combination of sludge and multiple small stones. The liver is within normal limits. Pancreas: Unremarkable. No pancreatic ductal dilatation or surrounding inflammatory changes. Spleen: Normal in size without focal abnormality. Adrenals/Urinary Tract: Adrenal glands are within normal limits. Kidneys demonstrate Jacquelynne Guedes normal enhancement pattern bilaterally. Rickell Wiehe cyst is noted on the right inferiorly measuring 2.2 cm slightly larger than that seen on prior exam. Normal excretion is noted on delayed images. No renal calculi are seen. No obstructive changes are noted. The bladder is within normal limits. Stomach/Bowel: Diverticulosis is noted throughout the colon with areas of pericolonic inflammatory change consistent with mild diverticulitis. Fluid and air are noted near the greater curvature of the stomach likely related to this diverticular inflammatory change. The appendix well visualized and demonstrates no evidence of appendicoliths. It is partially air-filled. Surrounding the tip of the appendix, there is Alecsander Hattabaugh focal air-fluid collection which measures approximately 3.4 x 4.2 cm in greatest dimension. These changes are suspicious for underlying appendiceal perforation with evolving abscess formation. Sliding-type hiatal hernia is noted with approximately 30% of the stomach within  the chest. No definitive ulceration of the stomach is noted. The intervening small bowel is within normal limits. Vascular/Lymphatic: Diffuse atherosclerotic calcifications are noted. No significant lymphadenopathy is seen. Reproductive: Uterus demonstrates multiple uterine fibroids with calcification. No definitive adnexal mass is noted. Other: Free fluid is noted in the pelvic cul-de-sac Musculoskeletal: Degenerative changes of lumbar spine are seen. IMPRESSION: Air-fluid collection surrounding the tip of the appendix suggestive of distal appendiceal perforation and evolving abscess formation in the periappendiceal region. Diverticulosis is noted throughout the colon. There are some pericolonic areas of inflammatory change as well as foci of air consistent with diverticulitis. This is most notable in the mid to distal transverse colon. This likely accounts for the additional air and fluid surrounding the stomach. Stable right lower lobe nodule consistent with Deondray Ospina benign etiology. Mild left basilar atelectasis. Cholelithiasis without complicating factors. Electronically Signed   By: Inez Catalina M.D.   On: 09/28/2020 11:23   DG CHEST PORT 1 VIEW  Result Date: 09/30/2020 CLINICAL DATA:  Evaluate for pneumonia EXAM: PORTABLE CHEST 1 VIEW COMPARISON:  09/28/2020 FINDINGS: There is Athaliah Baumbach nasogastric tube with tip and side port below the level of the GE junction. Heart size is normal. Aortic atherosclerotic calcifications. Small bilateral pleural effusions, unchanged. Decreased aeration of both lung bases compatible with atelectasis and or pneumonia. New bandlike area of atelectasis within the left midlung. IMPRESSION: 1. Persistent small pleural effusions with decreased aeration of both lower lung zones. 2. New bandlike area of atelectasis within the left midlung. Electronically Signed   By: Kerby Moors M.D.   On: 09/30/2020 07:25   DG Chest Portable 1 View  Result Date: 09/28/2020 CLINICAL DATA:  NG placement  EXAM: PORTABLE CHEST 1 VIEW COMPARISON:  Earlier same day FINDINGS: New enteric tube passes into the distal stomach with tip looping back to the gastric body. Bowel gas pattern is not well evaluated. Left mid and lower lung opacities again noted. IMPRESSION: New enteric tube passes into the stomach. Electronically Signed   By: Macy Mis M.D.   On: 09/28/2020  15:09   DG Chest Port 1 View  Result Date: 09/28/2020 CLINICAL DATA:  Chest pain EXAM: PORTABLE CHEST 1 VIEW COMPARISON:  05/23/2018 FINDINGS: Mild cardiomegaly. Atherosclerotic calcification of the aortic knob. Patchy airspace opacity within the left mid lung and left lung base. Right lung is clear. Possible trace left pleural effusion. No pneumothorax. IMPRESSION: Patchy airspace opacity within the left mid lung and left lung base suspicious for pneumonia. Electronically Signed   By: Davina Poke D.O.   On: 09/28/2020 09:47        Scheduled Meds: . Chlorhexidine Gluconate Cloth  6 each Topical Daily  . enoxaparin (LOVENOX) injection  40 mg Subcutaneous Q24H  . lip balm  1 application Topical BID  . mouth rinse  15 mL Mouth Rinse BID  . pantoprazole (PROTONIX) IV  40 mg Intravenous QHS   Continuous Infusions: . sodium chloride Stopped (09/30/20 0551)  . lactated ringers    . lactated ringers Stopped (09/29/20 1444)  . lactated ringers 100 mL/hr at 09/30/20 0600  . methocarbamol (ROBAXIN) IV    . ondansetron (ZOFRAN) IV    . piperacillin-tazobactam (ZOSYN)  IV 12.5 mL/hr at 09/30/20 0600     LOS: 2 days    Time spent: over 30 min    Fayrene Helper, MD [facility]   To contact the attending provider between 7A-7P or the covering provider during after hours 7P-7A, please log into the web site [email or web address] and access using universal [place] password for that web site. If you do not have the password, please call the hospital operator.  09/30/2020, 9:07 AM

## 2020-10-01 ENCOUNTER — Inpatient Hospital Stay (HOSPITAL_COMMUNITY): Payer: Medicare Other

## 2020-10-01 DIAGNOSIS — M7989 Other specified soft tissue disorders: Secondary | ICD-10-CM | POA: Diagnosis not present

## 2020-10-01 DIAGNOSIS — K3521 Acute appendicitis with generalized peritonitis, with abscess: Secondary | ICD-10-CM | POA: Diagnosis not present

## 2020-10-01 LAB — PHOSPHORUS: Phosphorus: 2.8 mg/dL (ref 2.5–4.6)

## 2020-10-01 LAB — CBC WITH DIFFERENTIAL/PLATELET
Abs Immature Granulocytes: 0.52 10*3/uL — ABNORMAL HIGH (ref 0.00–0.07)
Basophils Absolute: 0.2 10*3/uL — ABNORMAL HIGH (ref 0.0–0.1)
Basophils Relative: 1 %
Eosinophils Absolute: 0.1 10*3/uL (ref 0.0–0.5)
Eosinophils Relative: 0 %
HCT: 31.1 % — ABNORMAL LOW (ref 36.0–46.0)
Hemoglobin: 10.5 g/dL — ABNORMAL LOW (ref 12.0–15.0)
Immature Granulocytes: 3 %
Lymphocytes Relative: 9 %
Lymphs Abs: 1.8 10*3/uL (ref 0.7–4.0)
MCH: 31 pg (ref 26.0–34.0)
MCHC: 33.8 g/dL (ref 30.0–36.0)
MCV: 91.7 fL (ref 80.0–100.0)
Monocytes Absolute: 0.7 10*3/uL (ref 0.1–1.0)
Monocytes Relative: 3 %
Neutro Abs: 17.8 10*3/uL — ABNORMAL HIGH (ref 1.7–7.7)
Neutrophils Relative %: 84 %
Platelets: 282 10*3/uL (ref 150–400)
RBC: 3.39 MIL/uL — ABNORMAL LOW (ref 3.87–5.11)
RDW: 13.4 % (ref 11.5–15.5)
WBC: 20.9 10*3/uL — ABNORMAL HIGH (ref 4.0–10.5)
nRBC: 0 % (ref 0.0–0.2)

## 2020-10-01 LAB — COMPREHENSIVE METABOLIC PANEL WITH GFR
ALT: 48 U/L — ABNORMAL HIGH (ref 0–44)
AST: 57 U/L — ABNORMAL HIGH (ref 15–41)
Albumin: 1.9 g/dL — ABNORMAL LOW (ref 3.5–5.0)
Alkaline Phosphatase: 100 U/L (ref 38–126)
Anion gap: 11 (ref 5–15)
BUN: 33 mg/dL — ABNORMAL HIGH (ref 8–23)
CO2: 22 mmol/L (ref 22–32)
Calcium: 8.1 mg/dL — ABNORMAL LOW (ref 8.9–10.3)
Chloride: 108 mmol/L (ref 98–111)
Creatinine, Ser: 0.75 mg/dL (ref 0.44–1.00)
GFR, Estimated: >60 mL/min
Glucose, Bld: 72 mg/dL (ref 70–99)
Potassium: 3.6 mmol/L (ref 3.5–5.1)
Sodium: 141 mmol/L (ref 135–145)
Total Bilirubin: 0.9 mg/dL (ref 0.3–1.2)
Total Protein: 5.4 g/dL — ABNORMAL LOW (ref 6.5–8.1)

## 2020-10-01 LAB — MAGNESIUM: Magnesium: 2.1 mg/dL (ref 1.7–2.4)

## 2020-10-01 MED ORDER — LACTATED RINGERS IV BOLUS: 1000.0000 mL | Freq: Three times a day (TID) | INTRAVENOUS | Status: DC | PRN

## 2020-10-01 MED ORDER — SODIUM CHLORIDE 0.9 % IV SOLN
100.0000 mg | INTRAVENOUS | Status: AC
  Administered 2020-10-02 – 2020-10-07 (×6): 100 mg via INTRAVENOUS
  Filled 2020-10-01 (×6): qty 100

## 2020-10-01 MED ORDER — SODIUM CHLORIDE 0.9 % IV SOLN
200.0000 mg | Freq: Once | INTRAVENOUS | Status: AC
  Administered 2020-10-01: 200 mg via INTRAVENOUS
  Filled 2020-10-01: qty 200

## 2020-10-01 NOTE — Progress Notes (Signed)
Consult NOTE    Molly Manning  PIR:[id number] DOB: 1940/12/14 DOA: 09/28/2020 PCP: Deland Pretty, MD   Chief Complaint  Patient presents with  . Chest Pain  . Abdominal Pain    Brief Narrative: Molly Manning is Molly Manning 79 y.o. female with medical history significant of HLD, overactive bladder. She presented on 11/23 with abdominal pain and was found to have CT imaging concerning for perforated appendicitis.  She's now s/p laparoscopic appendectomy with drainage of abscess and abdominal washout by surgery.  [facility] was consulted for medical management.   Assessment & Plan:   Principal Problem:   Gangrenous perforated appendicitis with generalized peritonitis & abscesses s/p lap appendectomy & washout 09/28/2020 Active Problems:   Atherosclerosis of coronary artery without angina pectoris   Hiatal hernia   Multiple nodules of lung   Psoriasis   Pure hypercholesterolemia   Pneumoperitoneum   Septic shock (HCC)   Acute appendicitis with generalized peritonitis, abscess, and gangrene   Hypomagnesemia  Sepsis secondary to gangrenous perforated appendicitis  Intraabdominal and Retroperitoneal Abscesses  Feculent Peritonitis     - per primary team     - s/p laparoscopic drainage of abscess x2, laparoscopic appendectomy, diagnostic laparoscopy with abdominal washout on 11/23     - follow surg path - pending     - NGT in place, NPO - ice chips ok, per discussion with surgery - passing minimal flatus, continue NGT, defer to surgery     - continue abx and IVF     - therapy eval  Acute Hypoxic Respiratory Failure  Community Acquired Pneumonia     - currently on RA     - CXR from 11/23 with patchy airspace opacities within L mid lung and L lung base concerning for pneumonia      - continue abx as above      - repeat CXR 11/25 -> persistent small effusions with decreased aerations of both lower lung zones - bandlike atelectasis within L midlung      - continue IS  Leukocytosis: worse today,  afebrile, will continue to monitor on abx for now - low threshold for additional imaging  RUE Edema: suspect 2/2 infiltration, d/c IV, will follow RUE [place]  Elevated LFTs: continue to monitor post op, unclear etiology  Normocytic Anemia  Iron Def Anemia       - likely dilutional, possible component of blood loss from surgery, continue to monitor - labs c/w IDA  HLD     - hold crestor while NPO  Overactive bladder     - hold myrbetriq while NPO   Primary Prevention     - hold ASA while NPO  DVT prophylaxis: lovenox Code Status: full  Family Communication: husband at bedside 11/26 Disposition:   Status is: Inpatient  Remains inpatient appropriate because:Inpatient level of care appropriate due to severity of illness   Dispo: The patient is from: Home              Anticipated d/c is to: pending              Anticipated d/c date is: > 3 days              Patient currently is not medically stable to d/c.       Consultants:   Surgery is primary  [facility]ton is consulting  Procedures:  s/p laparoscopic drainage of abscess x2, laparoscopic appendectomy, diagnostic laparoscopy with abdominal washout on 11/23  Antimicrobials: Anti-infectives (From admission, onward)   Start  Dose/Rate Route Frequency Ordered Stop   09/29/20 2200  piperacillin-tazobactam (ZOSYN) IVPB 3.375 g        3.375 g 12.5 mL/hr over 240 Minutes Intravenous Every 8 hours 09/29/20 1524 10/03/20 2159   09/28/20 2000  piperacillin-tazobactam (ZOSYN) IVPB 3.375 g  Status:  Discontinued        3.375 g 12.5 mL/hr over 240 Minutes Intravenous Every 8 hours 09/28/20 1937 09/29/20 1524   09/28/20 1830  clindamycin (CLEOCIN) 900 mg, gentamicin (GARAMYCIN) 240 mg in sodium chloride 0.9 % 1,000 mL for intraperitoneal lavage         Irrigation To Surgery 09/28/20 1736 09/28/20 1806   09/28/20 1630  clindamycin (CLEOCIN) 900 mg, gentamicin (GARAMYCIN) 240 mg in sodium chloride 0.9 % 1,000 mL for intraperitoneal  lavage  Status:  Discontinued       Note to Pharmacy: Have in the  OR room for final irrigation in bowel surgery case to minimize risk of abscess/infection Pharmacy may adjust dosing strength, schedule, rate of infusion, etc as needed to optimize therapy    Irrigation To Surgery 09/28/20 1618 09/28/20 1649   09/28/20 1600  cefoTEtan (CEFOTAN) 2 g in sodium chloride 0.9 % 100 mL IVPB        2 g 200 mL/hr over 30 Minutes Intravenous On call to O.R. 09/28/20 1320 09/28/20 1605   09/28/20 1145  piperacillin-tazobactam (ZOSYN) IVPB 3.375 g        3.375 g 100 mL/hr over 30 Minutes Intravenous  Once 09/28/20 1144 09/28/20 1353     Subjective: Minimal flatus, no BP  Objective: Vitals:   09/30/20 0932 09/30/20 1332 09/30/20 2305 10/01/20 0418  BP: 138/78 (!) 142/83 (!) 145/76 (!) 143/69  Pulse: 85 91 88 94  Resp: 20 18 16 14   Temp: 97.8 F (36.6 C) 98.3 F (36.8 C) 98 F (36.7 C) 98.1 F (36.7 C)  TempSrc: Oral Oral Oral   SpO2: 91% 90% 92% 93%  Weight:      Height:        Intake/Output Summary (Last 24 hours) at 10/01/2020 0935 Last data filed at 10/01/2020 0625 Gross per 24 hour  Intake 2471.64 ml  Output 1660 ml  Net 811.64 ml   Filed Weights   09/28/20 2023 09/29/20 0401 09/30/20 0342  Weight: 76.9 kg 77 kg 78.1 kg    Examination:  General: No acute distress. Cardiovascular: Heart sounds show Toluwani Yadav regular rate, and rhythm.  Lungs: Clear to auscultation bilaterally  Abdomen: Soft, appropriately tender, JP drain x2 with serosanguinous fluid, intact dressing, clean and dry Neurological: Alert and oriented 3. Moves all extremities 4. Cranial nerves II through XII grossly intact. Skin: Warm and dry. No rashes or lesions. Extremities: edema to RUE     Data Reviewed: I have personally reviewed following labs and imaging studies  CBC: Recent Labs  Lab 09/28/20 0850 09/29/20 0232 09/30/20 0320 10/01/20 0347  WBC 10.9* 6.4 11.7* 20.9*  NEUTROABS  --   --   --  17.8*   HGB 12.6 10.8* 10.1* 10.5*  HCT 37.3 32.9* 30.4* 31.1*  MCV 92.1 94.3 93.5 91.7  PLT 307 258 254 294    Basic Metabolic Panel: Recent Labs  Lab 09/28/20 0850 09/29/20 0232 09/30/20 0320 10/01/20 0347  NA 138 140 141 141  K 3.9 4.0 3.8 3.6  CL 101 108 108 108  CO2 25 22 24 22   GLUCOSE 146* 112* 115* 72  BUN 36* 34* 45* 33*  CREATININE 1.40* 0.95 0.95 0.75  CALCIUM 8.9 7.5* 8.1* 8.1*  MG  --  1.7 2.6* 2.1  PHOS  --  3.2 3.7 2.8    GFR: Estimated Creatinine Clearance: 61.1 mL/min (by C-G formula based on SCr of 0.75 mg/dL).  Liver Function Tests: Recent Labs  Lab 09/28/20 0850 09/30/20 0320 10/01/20 0347  AST 28 123* 57*  ALT 21 71* 48*  ALKPHOS 107 87 100  BILITOT 0.9 1.0 0.9  PROT 7.7 5.7* 5.4*  ALBUMIN 3.2* 2.1* 1.9*    CBG: No results for input(s): GLUCAP in the last 168 hours.   Recent Results (from the past 240 hour(s))  Resp Panel by RT-PCR (Flu Rifky Lapre&B, Covid) Nasopharyngeal Swab     Status: None   Collection Time: 09/28/20  9:54 AM   Specimen: Nasopharyngeal Swab; Nasopharyngeal(NP) swabs in vial transport medium  Result Value Ref Range Status   SARS Coronavirus 2 by RT PCR NEGATIVE NEGATIVE Final    Comment: (NOTE) SARS-CoV-2 target nucleic acids are NOT DETECTED.  The SARS-CoV-2 RNA is generally detectable in upper respiratory specimens during the acute phase of infection. The lowest concentration of SARS-CoV-2 viral copies this assay can detect is 138 copies/mL. Gavriel Holzhauer negative result does not preclude SARS-Cov-2 infection and should not be used as the sole basis for treatment or other patient management decisions. Itzel Mckibbin negative result may occur with  improper specimen collection/handling, submission of specimen other than nasopharyngeal swab, presence of viral mutation(s) within the areas targeted by this assay, and inadequate number of viral copies(<138 copies/mL). Zakeria Kulzer negative result must be combined with clinical observations, patient history, and  epidemiological information. The expected result is Negative.  Fact Sheet for Patients:  EntrepreneurPulse.com.au  Fact Sheet for Healthcare Providers:  IncredibleEmployment.be  This test is no t yet approved or cleared by the [place] FDA and  has been authorized for detection and/or diagnosis of SARS-CoV-2 by FDA under an Emergency Use Authorization (EUA). This EUA will remain  in effect (meaning this test can be used) for the duration of the COVID-19 declaration under Section 564(b)(1) of the Act, 21 U.S.C.section 360bbb-3(b)(1), unless the authorization is terminated  or revoked sooner.       Influenza Charlee Squibb by PCR NEGATIVE NEGATIVE Final   Influenza B by PCR NEGATIVE NEGATIVE Final    Comment: (NOTE) The Xpert Xpress SARS-CoV-2/FLU/RSV plus assay is intended as an aid in the diagnosis of influenza from Nasopharyngeal swab specimens and should not be used as Resa Rinks sole basis for treatment. Nasal washings and aspirates are unacceptable for Xpert Xpress SARS-CoV-2/FLU/RSV testing.  Fact Sheet for Patients: EntrepreneurPulse.com.au  Fact Sheet for Healthcare Providers: IncredibleEmployment.be  This test is not yet approved or cleared by the [place] FDA and has been authorized for detection and/or diagnosis of SARS-CoV-2 by FDA under an Emergency Use Authorization (EUA). This EUA will remain in effect (meaning this test can be used) for the duration of the COVID-19 declaration under Section 564(b)(1) of the Act, 21 U.S.C. section 360bbb-3(b)(1), unless the authorization is terminated or revoked.  Performed at [facility], [place] [address] [place], [place] [address]   MRSA PCR Screening     Status: None   Collection Time: 09/28/20  8:15 PM   Specimen: Nasopharyngeal Wash  Result Value Ref Range Status   MRSA by PCR NEGATIVE NEGATIVE Final    Comment:        The GeneXpert MRSA  Assay (FDA approved for NASAL specimens only), is one component of Jeyren Danowski comprehensive MRSA colonization surveillance  program. It is not intended to diagnose MRSA infection nor to guide or monitor treatment for MRSA infections. Performed at [facility], [place] [address] [place], [place] [address]          Radiology Studies: DG CHEST PORT 1 VIEW  Result Date: 09/30/2020 CLINICAL DATA:  Evaluate for pneumonia EXAM: PORTABLE CHEST 1 VIEW COMPARISON:  09/28/2020 FINDINGS: There is Perina Salvaggio nasogastric tube with tip and side port below the level of the GE junction. Heart size is normal. Aortic atherosclerotic calcifications. Small bilateral pleural effusions, unchanged. Decreased aeration of both lung bases compatible with atelectasis and or pneumonia. New bandlike area of atelectasis within the left midlung. IMPRESSION: 1. Persistent small pleural effusions with decreased aeration of both lower lung zones. 2. New bandlike area of atelectasis within the left midlung. Electronically Signed   By: Kerby Moors M.D.   On: 09/30/2020 07:25        Scheduled Meds: . Chlorhexidine Gluconate Cloth  6 each Topical Daily  . enoxaparin (LOVENOX) injection  40 mg Subcutaneous Q24H  . lip balm  1 application Topical BID  . mouth rinse  15 mL Mouth Rinse BID  . pantoprazole (PROTONIX) IV  40 mg Intravenous QHS   Continuous Infusions: . sodium chloride Stopped (09/30/20 0551)  . lactated ringers Stopped (09/29/20 1444)  . lactated ringers 100 mL/hr at 10/01/20 0402  . methocarbamol (ROBAXIN) IV    . ondansetron (ZOFRAN) IV    . piperacillin-tazobactam (ZOSYN)  IV 3.375 g (10/01/20 0826)     LOS: 3 days    Time spent: over 78 min    Fayrene Helper, MD [facility]   To contact the attending provider between 7A-7P or the covering provider during after hours 7P-7A, please log into the web site [email or web address] and access using universal  password for that web  site. If you do not have the password, please call the hospital operator.  10/01/2020, 9:35 AM

## 2020-10-01 NOTE — Plan of Care (Signed)
Plan of care reviewed and discussed with the patient. 

## 2020-10-01 NOTE — Progress Notes (Signed)
3 Days Post-Op   Subjective/Chief Complaint: Pain controlled Passing some flatus   Objective: Vital signs in last 24 hours: Temp:  [98 F (36.7 C)-98.3 F (36.8 C)] 98.1 F (36.7 C) (11/26 0418) Pulse Rate:  [88-94] 94 (11/26 0418) Resp:  [14-18] 14 (11/26 0418) BP: (142-145)/(69-83) 143/69 (11/26 0418) SpO2:  [90 %-93 %] 93 % (11/26 0418) Last BM Date:  (PTA)  Intake/Output from previous day: 11/25 0701 - 11/26 0700 In: 2471.6 [P.O.:30; I.V.:2293.4; IV Piggyback:148.2] Out: 2130 [Urine:1700; Emesis/NG output:325; Drains:105] Intake/Output this shift: No intake/output data recorded.  Exam: Looks better than yesterday Abdomen soft, drains seropurulent,   Lab Results:  Recent Labs    09/30/20 0320 10/01/20 0347  WBC 11.7* 20.9*  HGB 10.1* 10.5*  HCT 30.4* 31.1*  PLT 254 282   BMET Recent Labs    09/30/20 0320 10/01/20 0347  NA 141 141  K 3.8 3.6  CL 108 108  CO2 24 22  GLUCOSE 115* 72  BUN 45* 33*  CREATININE 0.95 0.75  CALCIUM 8.1* 8.1*   PT/INR No results for input(s): LABPROT, INR in the last 72 hours. ABG No results for input(s): PHART, HCO3 in the last 72 hours.  Invalid input(s): PCO2, PO2  Studies/Results: DG CHEST PORT 1 VIEW  Result Date: 09/30/2020 CLINICAL DATA:  Evaluate for pneumonia EXAM: PORTABLE CHEST 1 VIEW COMPARISON:  09/28/2020 FINDINGS: There is a nasogastric tube with tip and side port below the level of the GE junction. Heart size is normal. Aortic atherosclerotic calcifications. Small bilateral pleural effusions, unchanged. Decreased aeration of both lung bases compatible with atelectasis and or pneumonia. New bandlike area of atelectasis within the left midlung. IMPRESSION: 1. Persistent small pleural effusions with decreased aeration of both lower lung zones. 2. New bandlike area of atelectasis within the left midlung. Electronically Signed   By: Kerby Moors M.D.   On: 09/30/2020 07:25    Anti-infectives: Anti-infectives  (From admission, onward)   Start     Dose/Rate Route Frequency Ordered Stop   09/29/20 2200  piperacillin-tazobactam (ZOSYN) IVPB 3.375 g        3.375 g 12.5 mL/hr over 240 Minutes Intravenous Every 8 hours 09/29/20 1524 10/03/20 2159   09/28/20 2000  piperacillin-tazobactam (ZOSYN) IVPB 3.375 g  Status:  Discontinued        3.375 g 12.5 mL/hr over 240 Minutes Intravenous Every 8 hours 09/28/20 1937 09/29/20 1524   09/28/20 1830  clindamycin (CLEOCIN) 900 mg, gentamicin (GARAMYCIN) 240 mg in sodium chloride 0.9 % 1,000 mL for intraperitoneal lavage         Irrigation To Surgery 09/28/20 1736 09/28/20 1806   09/28/20 1630  clindamycin (CLEOCIN) 900 mg, gentamicin (GARAMYCIN) 240 mg in sodium chloride 0.9 % 1,000 mL for intraperitoneal lavage  Status:  Discontinued       Note to Pharmacy: Have in the  [place] room for final irrigation in bowel surgery case to minimize risk of abscess/infection Pharmacy may adjust dosing strength, schedule, rate of infusion, etc as needed to optimize therapy    Irrigation To Surgery 09/28/20 1618 09/28/20 1649   09/28/20 1600  cefoTEtan (CEFOTAN) 2 g in sodium chloride 0.9 % 100 mL IVPB        2 g 200 mL/hr over 30 Minutes Intravenous On call to O.R. 09/28/20 1320 09/28/20 1605   09/28/20 1145  piperacillin-tazobactam (ZOSYN) IVPB 3.375 g        3.375 g 100 mL/hr over 30 Minutes Intravenous  Once 09/28/20  1144 09/28/20 1353      Assessment/Plan: GANGREONOUSPERFORATEDAPPENDICITIS,INTRA ABDOMINAL& RETROPERITONEALABSESSES,FECULENT PERITONITIS -S/pLAPAROSCOPIC DRAINAGE OF ABSESS X 2,LAPAROSCOPIC APPENDECTOMY,DIAGNOSTIC LAPAROSCOPYWITH ABDOMINAL WASHOUT11/23 Dr. Johney Maine  LOS: 3 days    Continue antibiotics Clamp NG Start clear liquids D/c foley Repeat WBC in the morning   Coralie Keens MD 10/01/2020

## 2020-10-01 NOTE — Progress Notes (Signed)
Upper extremity venous RT study completed.   Please see CV Proc for preliminary results.   Darlin Coco, RDMS

## 2020-10-02 DIAGNOSIS — K3521 Acute appendicitis with generalized peritonitis, with abscess: Secondary | ICD-10-CM | POA: Diagnosis not present

## 2020-10-02 LAB — COMPREHENSIVE METABOLIC PANEL
ALT: 33 U/L (ref 0–44)
AST: 31 U/L (ref 15–41)
Albumin: 2 g/dL — ABNORMAL LOW (ref 3.5–5.0)
Alkaline Phosphatase: 114 U/L (ref 38–126)
Anion gap: 12 (ref 5–15)
BUN: 22 mg/dL (ref 8–23)
CO2: 26 mmol/L (ref 22–32)
Calcium: 8 mg/dL — ABNORMAL LOW (ref 8.9–10.3)
Chloride: 105 mmol/L (ref 98–111)
Creatinine, Ser: 0.64 mg/dL (ref 0.44–1.00)
GFR, Estimated: >60 mL/min (ref >60)
Glucose, Bld: 84 mg/dL (ref 70–99)
Potassium: 3.3 mmol/L — ABNORMAL LOW (ref 3.5–5.1)
Sodium: 143 mmol/L (ref 135–145)
Total Bilirubin: 0.9 mg/dL (ref 0.3–1.2)
Total Protein: 5.6 g/dL — ABNORMAL LOW (ref 6.5–8.1)

## 2020-10-02 LAB — CBC WITH DIFFERENTIAL/PLATELET
Abs Immature Granulocytes: 0.99 10*3/uL — ABNORMAL HIGH (ref 0.00–0.07)
Basophils Absolute: 0 10*3/uL (ref 0.0–0.1)
Basophils Relative: 0 %
Eosinophils Absolute: 0.2 10*3/uL (ref 0.0–0.5)
Eosinophils Relative: 1 %
HCT: 34 % — ABNORMAL LOW (ref 36.0–46.0)
Hemoglobin: 11 g/dL — ABNORMAL LOW (ref 12.0–15.0)
Immature Granulocytes: 5 %
Lymphocytes Relative: 9 %
Lymphs Abs: 1.7 10*3/uL (ref 0.7–4.0)
MCH: 30.6 pg (ref 26.0–34.0)
MCHC: 32.4 g/dL (ref 30.0–36.0)
MCV: 94.4 fL (ref 80.0–100.0)
Monocytes Absolute: 0.8 10*3/uL (ref 0.1–1.0)
Monocytes Relative: 4 %
Neutro Abs: 15.3 10*3/uL — ABNORMAL HIGH (ref 1.7–7.7)
Neutrophils Relative %: 81 %
Platelets: 345 10*3/uL (ref 150–400)
RBC: 3.6 MIL/uL — ABNORMAL LOW (ref 3.87–5.11)
RDW: 14 % (ref 11.5–15.5)
WBC: 19 10*3/uL — ABNORMAL HIGH (ref 4.0–10.5)
nRBC: 0 % (ref 0.0–0.2)

## 2020-10-02 LAB — MAGNESIUM: Magnesium: 1.8 mg/dL (ref 1.7–2.4)

## 2020-10-02 LAB — PHOSPHORUS: Phosphorus: 3.2 mg/dL (ref 2.5–4.6)

## 2020-10-02 MED ORDER — POTASSIUM CHLORIDE 10 MEQ/100ML IV SOLN
10.0000 meq | INTRAVENOUS | Status: AC
  Administered 2020-10-02 (×2): 10 meq via INTRAVENOUS
  Filled 2020-10-02 (×2): qty 100

## 2020-10-02 NOTE — Plan of Care (Signed)
  Problem: Education: Goal: Knowledge of General Education information will improve Description: Including pain rating scale, medication(s)/side effects and non-pharmacologic comfort measures Outcome: Progressing   Problem: Activity: Goal: Risk for activity intolerance will decrease Outcome: Progressing   Problem: Nutrition: Goal: Adequate nutrition will be maintained Outcome: Progressing   

## 2020-10-02 NOTE — Plan of Care (Signed)
  Problem: Education: Goal: Knowledge of General Education information will improve Description: Including pain rating scale, medication(s)/side effects and non-pharmacologic comfort measures Outcome: Progressing   Problem: Pain Managment: Goal: General experience of comfort will improve Outcome: Progressing   

## 2020-10-02 NOTE — Progress Notes (Signed)
Consult NOTE    Molly Manning  WOE:[id number] DOB: [birth year] DOA: 09/28/2020 PCP: Deland Pretty, MD   Chief Complaint  Patient presents with  . Chest Pain  . Abdominal Pain    Brief Narrative: Molly Manning is Molly Manning 79 y.o. female with medical history significant of HLD, overactive bladder. She presented on 11/23 with abdominal pain and was found to have CT imaging concerning for perforated appendicitis.  She's now s/p laparoscopic appendectomy with drainage of abscess and abdominal washout by surgery.  [facility] was consulted for medical management.   Assessment & Plan:   Principal Problem:   Gangrenous perforated appendicitis with generalized peritonitis & abscesses s/p lap appendectomy & washout 09/28/2020 Active Problems:   Atherosclerosis of coronary artery without angina pectoris   Hiatal hernia   Multiple nodules of lung   Psoriasis   Pure hypercholesterolemia   Pneumoperitoneum   Septic shock (HCC)   Acute appendicitis with generalized peritonitis, abscess, and gangrene   Hypomagnesemia  Sepsis secondary to gangrenous perforated appendicitis  Intraabdominal and Retroperitoneal Abscesses  Feculent Peritonitis     - per primary team     - s/p laparoscopic drainage of abscess x2, laparoscopic appendectomy, diagnostic laparoscopy with abdominal washout on 11/23     - follow surg path - pending     - NGT removed 11/27 per surgery, clears     - continue abx/antifungals per surgery (eraxis/zosyn per sugery)     - therapy eval  Acute Hypoxic Respiratory Failure  Community Acquired Pneumonia     - currently on RA     - CXR from 11/23 with patchy airspace opacities within L mid lung and L lung base concerning for pneumonia      - continue abx as above      - repeat CXR 11/25 -> persistent small effusions with decreased aerations of both lower lung zones - bandlike atelectasis within L midlung      - continue IS  Leukocytosis: worse today, afebrile, will continue to monitor on  abx for now - relatively stable today, low threshold for additional imaging  RUE Edema: RUE [place] negative for DVT  Elevated LFTs: continue to monitor post op, unclear etiology  Normocytic Anemia  Iron Def Anemia       - likely dilutional, possible component of blood loss from surgery, continue to monitor - labs c/w IDA  HLD     - hold crestor while NPO  Overactive bladder     - hold myrbetriq while NPO   Primary Prevention     - hold ASA while NPO  DVT prophylaxis: lovenox Code Status: full  Family Communication: husband at bedside 11/27 Disposition:   Status is: Inpatient  Remains inpatient appropriate because:Inpatient level of care appropriate due to severity of illness   Dispo: The patient is from: Home              Anticipated d/c is to: pending              Anticipated d/c date is: > 3 days              Patient currently is not medically stable to d/c.       Consultants:   Surgery is primary  [facility] [address] is consulting  Procedures:  s/p laparoscopic drainage of abscess x2, laparoscopic appendectomy, diagnostic laparoscopy with abdominal washout on 11/23  Antimicrobials: Anti-infectives (From admission, onward)   Start     Dose/Rate Route Frequency Ordered Stop   10/02/20 1000  anidulafungin (ERAXIS) 100 mg in sodium chloride 0.9 % 100 mL IVPB        100 mg 78 mL/hr over 100 Minutes Intravenous Every 24 hours 10/01/20 0958     10/01/20 1100  anidulafungin (ERAXIS) 200 mg in sodium chloride 0.9 % 200 mL IVPB        200 mg 78 mL/hr over 200 Minutes Intravenous  Once 10/01/20 1014 10/01/20 1638   09/29/20 2200  piperacillin-tazobactam (ZOSYN) IVPB 3.375 g        3.375 g 12.5 mL/hr over 240 Minutes Intravenous Every 8 hours 09/29/20 1524 10/03/20 2159   09/28/20 2000  piperacillin-tazobactam (ZOSYN) IVPB 3.375 g  Status:  Discontinued        3.375 g 12.5 mL/hr over 240 Minutes Intravenous Every 8 hours 09/28/20 1937 09/29/20 1524   09/28/20 1830  clindamycin  (CLEOCIN) 900 mg, gentamicin (GARAMYCIN) 240 mg in sodium chloride 0.9 % 1,000 mL for intraperitoneal lavage         Irrigation To Surgery 09/28/20 1736 09/28/20 1806   09/28/20 1630  clindamycin (CLEOCIN) 900 mg, gentamicin (GARAMYCIN) 240 mg in sodium chloride 0.9 % 1,000 mL for intraperitoneal lavage  Status:  Discontinued       Note to Pharmacy: Have in the  OR room for final irrigation in bowel surgery case to minimize risk of abscess/infection Pharmacy may adjust dosing strength, schedule, rate of infusion, etc as needed to optimize therapy    Irrigation To Surgery 09/28/20 1618 09/28/20 1649   09/28/20 1600  cefoTEtan (CEFOTAN) 2 g in sodium chloride 0.9 % 100 mL IVPB        2 g 200 mL/hr over 30 Minutes Intravenous On call to O.R. 09/28/20 1320 09/28/20 1605   09/28/20 1145  piperacillin-tazobactam (ZOSYN) IVPB 3.375 g        3.375 g 100 mL/hr over 30 Minutes Intravenous  Once 09/28/20 1144 09/28/20 1353     Subjective: Passing flatus Gradually feeling better  Objective: Vitals:   10/01/20 1339 10/01/20 2031 10/02/20 0648 10/02/20 0652  BP: 122/88 (!) 164/82 (!) 171/76 (!) 160/88  Pulse: 91 96 92 94  Resp: 18 16 20 20   Temp: (!) 97.3 F (36.3 C) 97.8 F (36.6 C) 98.4 F (36.9 C) 98.2 F (36.8 C)  TempSrc: Oral  Oral Oral  SpO2: (!) 87% 91% 92% 93%  Weight:    79.2 kg  Height:        Intake/Output Summary (Last 24 hours) at 10/02/2020 0926 Last data filed at 10/02/2020 0600 Gross per 24 hour  Intake 1978.82 ml  Output 1340 ml  Net 638.82 ml   Filed Weights   09/29/20 0401 09/30/20 0342 10/02/20 0652  Weight: 77 kg 78.1 kg 79.2 kg    Examination:  General: No acute distress. Cardiovascular: Heart sounds show Yaritzy Huser regular rate, and rhythm Lungs: Clear to auscultation bilaterally Abdomen: Soft, mildly distended, intact dressing, JP drain x2 Neurological: Alert and oriented 3. Moves all extremities 4. Cranial nerves II through XII grossly intact. Skin: Warm and  dry. No rashes or lesions. Extremities: No clubbing or cyanosis. No edema.    Data Reviewed: I have personally reviewed following labs and imaging studies  CBC: Recent Labs  Lab 09/28/20 0850 09/29/20 0232 09/30/20 0320 10/01/20 0347 10/02/20 0249  WBC 10.9* 6.4 11.7* 20.9* 19.0*  NEUTROABS  --   --   --  17.8* 15.3*  HGB 12.6 10.8* 10.1* 10.5* 11.0*  HCT 37.3 32.9* 30.4* 31.1* 34.0*  MCV 92.1 94.3 93.5 91.7 94.4  PLT 307 258 254 282 209    Basic Metabolic Panel: Recent Labs  Lab 09/28/20 0850 09/29/20 0232 09/30/20 0320 10/01/20 0347 10/02/20 0249  NA 138 140 141 141 143  K 3.9 4.0 3.8 3.6 3.3*  CL 101 108 108 108 105  CO2 25 22 24 22 26   GLUCOSE 146* 112* 115* 72 84  BUN 36* 34* 45* 33* 22  CREATININE 1.40* 0.95 0.95 0.75 0.64  CALCIUM 8.9 7.5* 8.1* 8.1* 8.0*  MG  --  1.7 2.6* 2.1 1.8  PHOS  --  3.2 3.7 2.8 3.2    GFR: Estimated Creatinine Clearance: 61.6 mL/min (by C-G formula based on SCr of 0.64 mg/dL).  Liver Function Tests: Recent Labs  Lab 09/28/20 0850 09/30/20 0320 10/01/20 0347 10/02/20 0249  AST 28 123* 57* 31  ALT 21 71* 48* 33  ALKPHOS 107 87 100 114  BILITOT 0.9 1.0 0.9 0.9  PROT 7.7 5.7* 5.4* 5.6*  ALBUMIN 3.2* 2.1* 1.9* 2.0*    CBG: No results for input(s): GLUCAP in the last 168 hours.   Recent Results (from the past 240 hour(s))  Resp Panel by RT-PCR (Flu Chiara Coltrin&B, Covid) Nasopharyngeal Swab     Status: None   Collection Time: 09/28/20  9:54 AM   Specimen: Nasopharyngeal Swab; Nasopharyngeal(NP) swabs in vial transport medium  Result Value Ref Range Status   SARS Coronavirus 2 by RT PCR NEGATIVE NEGATIVE Final    Comment: (NOTE) SARS-CoV-2 target nucleic acids are NOT DETECTED.  The SARS-CoV-2 RNA is generally detectable in upper respiratory specimens during the acute phase of infection. The lowest concentration of SARS-CoV-2 viral copies this assay can detect is 138 copies/mL. Thressa Shiffer negative result does not preclude  SARS-Cov-2 infection and should not be used as the sole basis for treatment or other patient management decisions. Lashun Ramseyer negative result may occur with  improper specimen collection/handling, submission of specimen other than nasopharyngeal swab, presence of viral mutation(s) within the areas targeted by this assay, and inadequate number of viral copies(<138 copies/mL). Faithann Natal negative result must be combined with clinical observations, patient history, and epidemiological information. The expected result is Negative.  Fact Sheet for Patients:  EntrepreneurPulse.com.au  Fact Sheet for Healthcare Providers:  IncredibleEmployment.be  This test is no t yet approved or cleared by the [place] FDA and  has been authorized for detection and/or diagnosis of SARS-CoV-2 by FDA under an Emergency Use Authorization (EUA). This EUA will remain  in effect (meaning this test can be used) for the duration of the COVID-19 declaration under Section 564(b)(1) of the Act, 21 U.S.C.section 360bbb-3(b)(1), unless the authorization is terminated  or revoked sooner.       Influenza Susi Goslin by PCR NEGATIVE NEGATIVE Final   Influenza B by PCR NEGATIVE NEGATIVE Final    Comment: (NOTE) The Xpert Xpress SARS-CoV-2/FLU/RSV plus assay is intended as an aid in the diagnosis of influenza from Nasopharyngeal swab specimens and should not be used as Cyndie Woodbeck sole basis for treatment. Nasal washings and aspirates are unacceptable for Xpert Xpress SARS-CoV-2/FLU/RSV testing.  Fact Sheet for Patients: EntrepreneurPulse.com.au  Fact Sheet for Healthcare Providers: IncredibleEmployment.be  This test is not yet approved or cleared by the [place] FDA and has been authorized for detection and/or diagnosis of SARS-CoV-2 by FDA under an Emergency Use Authorization (EUA). This EUA will remain in effect (meaning this test can be used) for the duration of  the COVID-19 declaration under Section 564(b)(1) of the  Act, 21 U.S.C. section 360bbb-3(b)(1), unless the authorization is terminated or revoked.  Performed at [facility], [place] [address], [place], [place] [address]   MRSA PCR Screening     Status: None   Collection Time: 09/28/20  8:15 PM   Specimen: Nasopharyngeal Wash  Result Value Ref Range Status   MRSA by PCR NEGATIVE NEGATIVE Final    Comment:        The GeneXpert MRSA Assay (FDA approved for NASAL specimens only), is one component of Montee Tallman comprehensive MRSA colonization surveillance program. It is not intended to diagnose MRSA infection nor to guide or monitor treatment for MRSA infections. Performed at [facility], [place] [address], [place], [place] [address]          Radiology Studies: VAS [place] UPPER EXTREMITY VENOUS DUPLEX  Result Date: 10/01/2020 UPPER VENOUS STUDY  Indications: Swelling Anticoagulation: Lovenox. Limitations: Poor ultrasound/tissue interface due to swelling. Comparison Study: No prior studies. Performing Technologist: Darlin Coco, RDMSLo  Examination Guidelines: Annalisia Ingber complete evaluation includes B-mode imaging, spectral Doppler, color Doppler, and power Doppler as needed of all accessible portions of each vessel. Bilateral testing is considered an integral part of Shneur Whittenburg complete examination. Limited examinations for reoccurring indications may be performed as noted.  Right Findings: +----------+------------+---------+-----------+----------+---------------------+ RIGHT     CompressiblePhasicitySpontaneousProperties       Summary        +----------+------------+---------+-----------+----------+---------------------+ IJV           Full       Yes       Yes                                    +----------+------------+---------+-----------+----------+---------------------+ Subclavian    Full       Yes       Yes                                     +----------+------------+---------+-----------+----------+---------------------+ Axillary      Full       Yes       Yes                                    +----------+------------+---------+-----------+----------+---------------------+ Brachial      Full                                    Some segments not                                                          well visualized    +----------+------------+---------+-----------+----------+---------------------+ Radial        Full                                                        +----------+------------+---------+-----------+----------+---------------------+ Ulnar         Full                                                        +----------+------------+---------+-----------+----------+---------------------+  Cephalic      Full                                   Unable to assess at                                                       [facility] due to bandages   +----------+------------+---------+-----------+----------+---------------------+ Basilic       Full                                   Unable to assess at                                                       [facility] due to bandages   +----------+------------+---------+-----------+----------+---------------------+  Left Findings: +----------+------------+---------+-----------+----------+-------+ LEFT      CompressiblePhasicitySpontaneousPropertiesSummary +----------+------------+---------+-----------+----------+-------+ Subclavian    Full       Yes       Yes                      +----------+------------+---------+-----------+----------+-------+  Summary:  Right: No evidence of deep vein thrombosis in the upper extremity. No evidence of superficial vein thrombosis in the upper extremity. However, some portions of this examination were limited. See technologist's comments above.  Left: No evidence of thrombosis in the subclavian.  *See  table(s) above for measurements and observations.  Diagnosing physician: Jamelle Haring Electronically signed by Jamelle Haring on 10/01/2020 at 2:56:03 PM.    Final         Scheduled Meds: . Chlorhexidine Gluconate Cloth  6 each Topical Daily  . enoxaparin (LOVENOX) injection  40 mg Subcutaneous Q24H  . lip balm  1 application Topical BID  . mouth rinse  15 mL Mouth Rinse BID  . pantoprazole (PROTONIX) IV  40 mg Intravenous QHS   Continuous Infusions: . sodium chloride 250 mL (10/02/20 0046)  . anidulafungin    . lactated ringers    . lactated ringers 50 mL/hr at 10/02/20 0600  . methocarbamol (ROBAXIN) IV    . ondansetron (ZOFRAN) IV    . piperacillin-tazobactam (ZOSYN)  IV 3.375 g (10/02/20 0832)  . potassium chloride       LOS: 4 days    Time spent: over 30 min    Fayrene Helper, MD [facility]   To contact the attending provider between 7A-7P or the covering provider during after hours 7P-7A, please log into the web site [email or web address] and access using universal [place] password for that web site. If you do not have the password, please call the hospital operator.  10/02/2020, 9:26 AM

## 2020-10-02 NOTE — Progress Notes (Signed)
Physical Therapy Treatment Patient Details Name: Molly Manning MRN: [id number] DOB: [birth year] Today's Date: 10/02/2020    History of Present Illness 79 y.o. female admitted with gangrenous perforated appendicitis & abscess. s/p laparoscopic appendectomy and washout 09/28/20. PMH of HLD.    PT Comments    Pt ambulated 400' with RW, no loss of balance, no pain, no dyspnea. Reviewed log roll technique for bed mobility. Pt is safe to ambulate in halls with assistance from spouse to push IV pole. Encouraged pt to ambulate at least TID. No further acute PT indicated as pt is at a modified independent level with mobility. Will sign off.    Follow Up Recommendations  No PT follow up     Equipment Recommendations  None recommended by PT    Recommendations for Other Services       Precautions / Restrictions Precautions Precautions: Other (comment) Precaution Comments: abdominal surgery, instructed pt in log roll, NG tube, 2 drains Restrictions Weight Bearing Restrictions: No    Mobility  Bed Mobility               General bed mobility comments: up in recliner  Transfers     Transfers: Sit to/from Stand Sit to Stand: Modified independent (Device/Increase time)            Ambulation/Gait Ambulation/Gait assistance: Modified independent (Device/Increase time) Gait Distance (Feet): 400 Feet Assistive device: Rolling walker (2 wheeled) Gait Pattern/deviations: WFL(Within Functional Limits) Gait velocity: WFL   General Gait Details: steady with RW, no loss of balance   Stairs             Wheelchair Mobility    Modified Rankin (Stroke Patients Only)       Balance Overall balance assessment: Modified Independent   Sitting balance-Leahy Scale: Good       Standing balance-Leahy Scale: Fair                              Cognition Arousal/Alertness: Awake/alert Behavior During Therapy: WFL for tasks assessed/performed Overall Cognitive  Status: Within Functional Limits for tasks assessed                                        Exercises      General Comments        Pertinent Vitals/Pain Pain Assessment: No/denies pain    Home Living                      Prior Function            PT Goals (current goals can now be found in the care plan section) Acute Rehab PT Goals Patient Stated Goal: tennis, golf PT Goal Formulation: All assessment and education complete, DC therapy Time For Goal Achievement: 10/14/20 Potential to Achieve Goals: Good Progress towards PT goals: Goals met/education completed, patient discharged from PT    Frequency    Min 3X/week      PT Plan Current plan remains appropriate    Co-evaluation              AM-PAC PT "6 Clicks" Mobility   Outcome Measure  Help needed turning from your back to your side while in a flat bed without using bedrails?: None Help needed moving from lying on your back to sitting on the side of a flat bed without  using bedrails?: A Little Help needed moving to and from a bed to a chair (including a wheelchair)?: None Help needed standing up from a chair using your arms (e.g., wheelchair or bedside chair)?: None Help needed to walk in hospital room?: None Help needed climbing 3-5 steps with a railing? : None 6 Click Score: 23    End of Session   Activity Tolerance: Patient tolerated treatment well Patient left: in chair;with call bell/phone within reach;with family/visitor present Nurse Communication: Mobility status PT Visit Diagnosis: Difficulty in walking, not elsewhere classified (R26.2);Pain     Time: 0761-5183 PT Time Calculation (min) (ACUTE ONLY): 17 min  Charges:  $Gait Training: 8-22 mins                     Blondell Reveal Kistler PT 10/02/2020  Acute Rehabilitation Services Pager [phone] Office [phone]

## 2020-10-02 NOTE — Progress Notes (Signed)
4 Days Post-Op   Subjective/Chief Complaint: Tolerated clears and NG clamped with some mild nausea and pain Passing more flatus   Objective: Vital signs in last 24 hours: Temp:  [97.3 F (36.3 C)-98.4 F (36.9 C)] 98.2 F (36.8 C) (11/27 8101) Pulse Rate:  [91-96] 94 (11/27 0652) Resp:  [16-20] 20 (11/27 0652) BP: (122-171)/(76-88) 160/88 (11/27 0652) SpO2:  [87 %-93 %] 93 % (11/27 0652) Weight:  [79.2 kg] 79.2 kg (11/27 0652) Last BM Date:  (PTA)  Intake/Output from previous day: 11/26 0701 - 11/27 0700 In: 1978.8 [P.O.:360; I.V.:1333.4; IV Piggyback:285.4] Out: 1340 [Urine:1300; Drains:40] Intake/Output this shift: No intake/output data recorded.  Exam: Abdomen soft, non-distended, mildly tender, drains purulent  Lab Results:  Recent Labs    10/01/20 0347 10/02/20 0249  WBC 20.9* 19.0*  HGB 10.5* 11.0*  HCT 31.1* 34.0*  PLT 282 345   BMET Recent Labs    10/01/20 0347 10/02/20 0249  NA 141 143  K 3.6 3.3*  CL 108 105  CO2 22 26  GLUCOSE 72 84  BUN 33* 22  CREATININE 0.75 0.64  CALCIUM 8.1* 8.0*   PT/INR No results for input(s): LABPROT, INR in the last 72 hours. ABG No results for input(s): PHART, HCO3 in the last 72 hours.  Invalid input(s): PCO2, PO2  Studies/Results: VAS [place] UPPER EXTREMITY VENOUS DUPLEX  Result Date: 10/01/2020 UPPER VENOUS STUDY  Indications: Swelling Anticoagulation: Lovenox. Limitations: Poor ultrasound/tissue interface due to swelling. Comparison Study: No prior studies. Performing Technologist: Darlin Coco, RDMSLo  Examination Guidelines: A complete evaluation includes B-mode imaging, spectral Doppler, color Doppler, and power Doppler as needed of all accessible portions of each vessel. Bilateral testing is considered an integral part of a complete examination. Limited examinations for reoccurring indications may be performed as noted.  Right Findings:  +----------+------------+---------+-----------+----------+---------------------+  RIGHT      Compressible Phasicity Spontaneous Properties        Summary         +----------+------------+---------+-----------+----------+---------------------+  IJV            Full        Yes        Yes                                       +----------+------------+---------+-----------+----------+---------------------+  Subclavian     Full        Yes        Yes                                       +----------+------------+---------+-----------+----------+---------------------+  Axillary       Full        Yes        Yes                                       +----------+------------+---------+-----------+----------+---------------------+  Brachial       Full                                        Some segments not  well visualized     +----------+------------+---------+-----------+----------+---------------------+  Radial         Full                                                             +----------+------------+---------+-----------+----------+---------------------+  Ulnar          Full                                                             +----------+------------+---------+-----------+----------+---------------------+  Cephalic       Full                                       Unable to assess at                                                              [facility] due to bandages    +----------+------------+---------+-----------+----------+---------------------+  Basilic        Full                                       Unable to assess at                                                              [facility] due to bandages    +----------+------------+---------+-----------+----------+---------------------+  Left Findings: +----------+------------+---------+-----------+----------+-------+  LEFT       Compressible Phasicity Spontaneous Properties Summary   +----------+------------+---------+-----------+----------+-------+  Subclavian     Full        Yes        Yes                         +----------+------------+---------+-----------+----------+-------+  Summary:  Right: No evidence of deep vein thrombosis in the upper extremity. No evidence of superficial vein thrombosis in the upper extremity. However, some portions of this examination were limited. See technologist's comments above.  Left: No evidence of thrombosis in the subclavian.  *See table(s) above for measurements and observations.  Diagnosing physician: Jamelle Haring Electronically signed by Jamelle Haring on 10/01/2020 at 2:56:03 PM.    Final     Anti-infectives: Anti-infectives (From admission, onward)   Start     Dose/Rate Route Frequency Ordered Stop   10/02/20 1000  anidulafungin (ERAXIS) 100 mg in sodium chloride 0.9 % 100 mL IVPB        100 mg 78 mL/hr over 100 Minutes Intravenous Every 24 hours 10/01/20 0958     10/01/20 1100  anidulafungin (ERAXIS)  200 mg in sodium chloride 0.9 % 200 mL IVPB        200 mg 78 mL/hr over 200 Minutes Intravenous  Once 10/01/20 1014 10/01/20 1638   09/29/20 2200  piperacillin-tazobactam (ZOSYN) IVPB 3.375 g        3.375 g 12.5 mL/hr over 240 Minutes Intravenous Every 8 hours 09/29/20 1524 10/03/20 2159   09/28/20 2000  piperacillin-tazobactam (ZOSYN) IVPB 3.375 g  Status:  Discontinued        3.375 g 12.5 mL/hr over 240 Minutes Intravenous Every 8 hours 09/28/20 1937 09/29/20 1524   09/28/20 1830  clindamycin (CLEOCIN) 900 mg, gentamicin (GARAMYCIN) 240 mg in sodium chloride 0.9 % 1,000 mL for intraperitoneal lavage         Irrigation To Surgery 09/28/20 1736 09/28/20 1806   09/28/20 1630  clindamycin (CLEOCIN) 900 mg, gentamicin (GARAMYCIN) 240 mg in sodium chloride 0.9 % 1,000 mL for intraperitoneal lavage  Status:  Discontinued       Note to Pharmacy: Have in the  [facility] room for final irrigation in bowel surgery case to minimize risk of  abscess/infection Pharmacy may adjust dosing strength, schedule, rate of infusion, etc as needed to optimize therapy    Irrigation To Surgery 09/28/20 1618 09/28/20 1649   09/28/20 1600  cefoTEtan (CEFOTAN) 2 g in sodium chloride 0.9 % 100 mL IVPB        2 g 200 mL/hr over 30 Minutes Intravenous On call to O.R. 09/28/20 1320 09/28/20 1605   09/28/20 1145  piperacillin-tazobactam (ZOSYN) IVPB 3.375 g        3.375 g 100 mL/hr over 30 Minutes Intravenous  Once 09/28/20 1144 09/28/20 1353      Assessment/Plan: GANGREONOUSPERFORATEDAPPENDICITIS,INTRA ABDOMINAL& RETROPERITONEALABSESSES,FECULENT PERITONITIS -S/pLAPAROSCOPIC DRAINAGE OF ABSESS X 2,LAPAROSCOPIC APPENDECTOMY,DIAGNOSTIC LAPAROSCOPYWITH ABDOMINAL WASHOUT11/23 Dr. Johney Maine  LOS: 4 days   WBC down slightly Continue antibiotics D/c NG Keep on clears today ambulate  Coralie Keens MD 10/02/2020

## 2020-10-02 NOTE — Progress Notes (Signed)
Occupational Therapy Progress Note  Patient progressing well with therapy, at min G level for bed mobility and supervision for functional transfers and ambulation with min cues for safety with hand placement. Patient instructed in use of reacher + sock aid for LB dressing, return demo doff/don sock with min cues for technique. Will continue to follow to maximize patient safety and independence with self care in order to facilitate D/C to venue listed below.    10/02/20 0831  OT Visit Information  Last OT Received On 10/02/20  Assistance Needed +1  History of Present Illness 79 y.o. female admitted with gangrenous perforated appendicitis & abscess. s/p laparoscopic appendectomy and washout 09/28/20. PMH of HLD.  Precautions  Precautions Other (comment)  Precaution Comments abdominal surgery, instructed pt in log roll, NG tube, 2 drains  Pain Assessment  Pain Assessment No/denies pain  Cognition  Arousal/Alertness Awake/alert  Behavior During Therapy WFL for tasks assessed/performed  Overall Cognitive Status Within Functional Limits for tasks assessed  ADL  Overall ADL's  Needs assistance/impaired  Grooming Oral care;Wash/dry face;Wash/dry hands;Brushing hair;Supervision/safety;Standing  Grooming Details (indicate cue type and reason) demonstrates increased activity tolerance to stand sink side for g/h, reports some nausea  Lower Body Dressing Supervision/safety;Sitting/lateral leans;With adaptive equipment  Lower Body Dressing Details (indicate cue type and reason) instruct patient in use of reacher + sock aid for LB dressing, patient return demo doff/don sock with min cues for technique with AE  Toilet Transfer Supervision/safety;Cueing for safety;Ambulation;Comfort height toilet;RW  [employer]ed forces technical officer Details (indicate cue type and reason) patient reports much easier to get on/off elevated toilet seat, supervision for safety.   Toileting- Clothing Manipulation and Hygiene  Supervision/safety;Sitting/lateral lean;Sit to/from stand  Functional mobility during ADLs Supervision/safety;Rolling walker  Bed Mobility  Overal bed mobility Needs Assistance  Bed Mobility Rolling;Sidelying to Sit  Rolling Supervision  Sidelying to sit Min guard  General bed mobility comments improved strength, activity tolerance to push through elbow to sit up with min G for safety/drain management, min cues for technique to complete log roll  Balance  Overall balance assessment Modified Independent  Sitting-balance support Feet supported  Sitting balance-Leahy Scale Good  Standing balance support No upper extremity supported;During functional activity  Standing balance-Leahy Scale Fair  Standing balance comment able to brush hair at sink without external support  Transfers  Overall transfer level Needs assistance  Equipment used Rolling walker (2 wheeled)  Transfers Sit to/from Stand  Sit to Stand Supervision  General transfer comment VCs for hand placement   Exercises  Exercises Other exercises  Other Exercises  Other Exercises pt reporting swelling in LEs, educate patient in performing ankle pumps in sets of 10 throughout the day  OT - End of Session  Equipment Utilized During Treatment Rolling walker  Activity Tolerance Patient tolerated treatment well  Patient left in chair;with call bell/phone within reach;Other (comment) (PT present)  Nurse Communication Mobility status  OT Assessment/Plan  OT Plan Discharge plan remains appropriate  OT Visit Diagnosis Pain  Pain - part of body  (abdomen)  OT Frequency (ACUTE ONLY) Min 2X/week  Follow Up Recommendations No OT follow up;Supervision - Intermittent  OT Equipment Tub/shower seat  AM-PAC OT "6 Clicks" Daily Activity Outcome Measure (Version 2)  Help from another person eating meals? 4  Help from another person taking care of personal grooming? 3  Help from another person toileting, which includes using toliet, bedpan,  or urinal? 3  Help from another person bathing (including washing, rinsing, drying)? 2  Help  from another person to put on and taking off regular upper body clothing? 3  Help from another person to put on and taking off regular lower body clothing? 3  6 Click Score 18  OT Goal Progression  Progress towards OT goals Progressing toward goals  Acute Rehab OT Goals  Patient Stated Goal tennis, golf  OT Goal Formulation With patient  Time For Goal Achievement 10/14/20  Potential to Achieve Goals Good  ADL Goals  Pt Will Perform Grooming with modified independence;sitting;standing  Pt Will Perform Upper Body Dressing with modified independence;sitting  Pt Will Perform Lower Body Dressing with modified independence;sit to/from stand;sitting/lateral leans;with adaptive equipment (as needed)  Pt Will Transfer to Toilet with modified independence;ambulating (walker as needed )  Pt Will Perform Toileting - Clothing Manipulation and hygiene with modified independence;sitting/lateral leans;sit to/from stand  OT Time Calculation  OT Start Time (ACUTE ONLY) 0744  OT Stop Time (ACUTE ONLY) 4619  OT Time Calculation (min) 27 min  OT General Charges  $OT Visit 1 Visit  OT Treatments  $Self Care/Home Management  23-37 mins  Delbert Phenix OT OT pager: [phone]

## 2020-10-03 DIAGNOSIS — E876 Hypokalemia: Secondary | ICD-10-CM

## 2020-10-03 DIAGNOSIS — K3521 Acute appendicitis with generalized peritonitis, with abscess: Secondary | ICD-10-CM | POA: Diagnosis not present

## 2020-10-03 LAB — COMPREHENSIVE METABOLIC PANEL
ALT: 25 U/L (ref 0–44)
AST: 27 U/L (ref 15–41)
Albumin: 2 g/dL — ABNORMAL LOW (ref 3.5–5.0)
Alkaline Phosphatase: 97 U/L (ref 38–126)
Anion gap: 11 (ref 5–15)
BUN: 16 mg/dL (ref 8–23)
CO2: 26 mmol/L (ref 22–32)
Calcium: 8 mg/dL — ABNORMAL LOW (ref 8.9–10.3)
Chloride: 103 mmol/L (ref 98–111)
Creatinine, Ser: 0.56 mg/dL (ref 0.44–1.00)
GFR, Estimated: >60 mL/min (ref >60)
Glucose, Bld: 94 mg/dL (ref 70–99)
Potassium: 3.3 mmol/L — ABNORMAL LOW (ref 3.5–5.1)
Sodium: 140 mmol/L (ref 135–145)
Total Bilirubin: 1.2 mg/dL (ref 0.3–1.2)
Total Protein: 5.6 g/dL — ABNORMAL LOW (ref 6.5–8.1)

## 2020-10-03 LAB — CBC WITH DIFFERENTIAL/PLATELET
Abs Immature Granulocytes: 1.24 10*3/uL — ABNORMAL HIGH (ref 0.00–0.07)
Basophils Absolute: 0.1 10*3/uL (ref 0.0–0.1)
Basophils Relative: 1 %
Eosinophils Absolute: 0.3 10*3/uL (ref 0.0–0.5)
Eosinophils Relative: 2 %
HCT: 32.9 % — ABNORMAL LOW (ref 36.0–46.0)
Hemoglobin: 10.9 g/dL — ABNORMAL LOW (ref 12.0–15.0)
Immature Granulocytes: 7 %
Lymphocytes Relative: 8 %
Lymphs Abs: 1.4 10*3/uL (ref 0.7–4.0)
MCH: 30.4 pg (ref 26.0–34.0)
MCHC: 33.1 g/dL (ref 30.0–36.0)
MCV: 91.6 fL (ref 80.0–100.0)
Monocytes Absolute: 0.9 10*3/uL (ref 0.1–1.0)
Monocytes Relative: 5 %
Neutro Abs: 13.5 10*3/uL — ABNORMAL HIGH (ref 1.7–7.7)
Neutrophils Relative %: 77 %
Platelets: 369 10*3/uL (ref 150–400)
RBC: 3.59 MIL/uL — ABNORMAL LOW (ref 3.87–5.11)
RDW: 13.8 % (ref 11.5–15.5)
WBC: 17.5 10*3/uL — ABNORMAL HIGH (ref 4.0–10.5)
nRBC: 0 % (ref 0.0–0.2)

## 2020-10-03 LAB — PHOSPHORUS: Phosphorus: 3.1 mg/dL (ref 2.5–4.6)

## 2020-10-03 LAB — MAGNESIUM: Magnesium: 1.8 mg/dL (ref 1.7–2.4)

## 2020-10-03 MED ORDER — ASPIRIN 81 MG PO CHEW
81.0000 mg | CHEWABLE_TABLET | Freq: Every day | ORAL | Status: DC
  Administered 2020-10-03 – 2020-10-12 (×9): 81 mg via ORAL
  Filled 2020-10-03 (×9): qty 1

## 2020-10-03 MED ORDER — LACTATED RINGERS IV BOLUS: 1000.0000 mL | Freq: Three times a day (TID) | INTRAVENOUS | Status: DC | PRN

## 2020-10-03 MED ORDER — ROSUVASTATIN CALCIUM 10 MG PO TABS
10.0000 mg | ORAL_TABLET | Freq: Every day | ORAL | Status: DC
  Administered 2020-10-03 – 2020-10-12 (×10): 10 mg via ORAL
  Filled 2020-10-03 (×10): qty 1

## 2020-10-03 MED ORDER — POTASSIUM CHLORIDE CRYS ER 20 MEQ PO TBCR
40.0000 meq | EXTENDED_RELEASE_TABLET | Freq: Every day | ORAL | Status: AC
  Administered 2020-10-03 – 2020-10-05 (×3): 40 meq via ORAL
  Filled 2020-10-03 (×3): qty 2

## 2020-10-03 MED ORDER — ACETAMINOPHEN 500 MG PO TABS
1000.0000 mg | ORAL_TABLET | Freq: Three times a day (TID) | ORAL | Status: DC
  Administered 2020-10-03 – 2020-10-12 (×27): 1000 mg via ORAL
  Filled 2020-10-03 (×27): qty 2

## 2020-10-03 MED ORDER — SODIUM CHLORIDE 0.9% FLUSH
3.0000 mL | INTRAVENOUS | Status: DC | PRN
  Administered 2020-10-07: 3 mL via INTRAVENOUS

## 2020-10-03 MED ORDER — TRAMADOL HCL 50 MG PO TABS
50.0000 mg | ORAL_TABLET | Freq: Four times a day (QID) | ORAL | Status: DC | PRN
  Administered 2020-10-03 – 2020-10-04 (×2): 50 mg via ORAL
  Administered 2020-10-07: 100 mg via ORAL
  Administered 2020-10-07: 50 mg via ORAL
  Administered 2020-10-08 – 2020-10-09 (×3): 100 mg via ORAL
  Administered 2020-10-10: 50 mg via ORAL
  Filled 2020-10-03: qty 1
  Filled 2020-10-03 (×2): qty 2
  Filled 2020-10-03 (×4): qty 1
  Filled 2020-10-03: qty 2
  Filled 2020-10-03: qty 1

## 2020-10-03 MED ORDER — METHOCARBAMOL 500 MG PO TABS
1000.0000 mg | ORAL_TABLET | Freq: Four times a day (QID) | ORAL | Status: DC | PRN
  Administered 2020-10-03: 1000 mg via ORAL
  Filled 2020-10-03: qty 2

## 2020-10-03 MED ORDER — SODIUM CHLORIDE 0.9 % IV SOLN: 250.0000 mL | INTRAVENOUS | Status: DC | PRN

## 2020-10-03 MED ORDER — MIRABEGRON ER 25 MG PO TB24
50.0000 mg | ORAL_TABLET | Freq: Every day | ORAL | Status: DC
  Administered 2020-10-03 – 2020-10-12 (×10): 50 mg via ORAL
  Filled 2020-10-03 (×10): qty 2

## 2020-10-03 MED ORDER — MAGNESIUM SULFATE 2 GM/50ML IV SOLN
2.0000 g | Freq: Once | INTRAVENOUS | Status: AC
  Administered 2020-10-03: 2 g via INTRAVENOUS
  Filled 2020-10-03: qty 50

## 2020-10-03 MED ORDER — ADULT MULTIVITAMIN W/MINERALS CH
1.0000 | ORAL_TABLET | Freq: Every day | ORAL | Status: DC
  Administered 2020-10-03 – 2020-10-12 (×10): 1 via ORAL
  Filled 2020-10-03 (×10): qty 1

## 2020-10-03 MED ORDER — PSYLLIUM 95 % PO PACK
1.0000 | PACK | Freq: Two times a day (BID) | ORAL | Status: DC
  Administered 2020-10-03 – 2020-10-11 (×10): 1 via ORAL
  Filled 2020-10-03 (×19): qty 1

## 2020-10-03 MED ORDER — SODIUM CHLORIDE 0.9% FLUSH
3.0000 mL | Freq: Two times a day (BID) | INTRAVENOUS | Status: DC
  Administered 2020-10-03 – 2020-10-08 (×8): 3 mL via INTRAVENOUS

## 2020-10-03 MED ORDER — GABAPENTIN 300 MG PO CAPS
300.0000 mg | ORAL_CAPSULE | Freq: Every day | ORAL | Status: AC
  Administered 2020-10-03 – 2020-10-05 (×3): 300 mg via ORAL
  Filled 2020-10-03 (×3): qty 1

## 2020-10-03 NOTE — Plan of Care (Signed)
  Problem: Education: Goal: Knowledge of General Education information will improve Description: Including pain rating scale, medication(s)/side effects and non-pharmacologic comfort measures Outcome: Progressing   Problem: Activity: Goal: Risk for activity intolerance will decrease Outcome: Progressing   Problem: Nutrition: Goal: Adequate nutrition will be maintained Outcome: Progressing   

## 2020-10-03 NOTE — Plan of Care (Signed)
  Problem: Activity: Goal: Risk for activity intolerance will decrease Outcome: Progressing   Problem: Pain Managment: Goal: General experience of comfort will improve Outcome: Progressing   

## 2020-10-03 NOTE — Progress Notes (Addendum)
Molly Manning [id number] [birth year]  CARE TEAM:  PCP: Deland Pretty, MD  Outpatient Care Team: Patient Care Team: Deland Pretty, MD as PCP - General (Internal Medicine) Ronnette Juniper, MD as Consulting Physician (Gastroenterology)  Inpatient Treatment Team: Treatment Team: Attending Provider: Edison Pace, Md, MD; Technician: Ihor Dow, [place]; Consulting Physician: Edison Pace, Md, MD; Rounding Team: Redmond Baseman, MD; Occupational Therapist: Betsy Pries, OT; Utilization Review: Claudie Leach, RN; Technician: Metro Kung, NT; Registered Nurse: Olen Cordial, RN; Social Worker: Adelene Amas, LCSWA   Problem List:   Principal Problem:   Gangrenous perforated appendicitis with generalized peritonitis & abscesses s/p lap appendectomy & washout 09/28/2020 Active Problems:   Atherosclerosis of coronary artery without angina pectoris   Hiatal hernia   Multiple nodules of lung   Psoriasis   Pure hypercholesterolemia   Pneumoperitoneum   Septic shock ([place])   Acute appendicitis with generalized peritonitis, abscess, and gangrene   Hypomagnesemia   5 Days Post-Op  09/28/2020  POST-OPERATIVE DIAGNOSIS:  GANGREONOUS PERFORATED APPENDICITIS INTRA ABDOMINAL & RETROPERITONEAL ABSESSES FECULENT PERITONITIS  PROCEDURE:   LAPAROSCOPIC DRAINAGE OF ABSESS X 2  LAPAROSCOPIC APPENDECTOMY DIAGNOSTIC LAPAROSCOPY WITH ABDOMINAL WASHOUT  SURGEON:  Adin Hector, MD   OR FINDINGS:  Patient had massive peritonitis with frank pus and purulence involving much of the upper abdomen.  Had retrocolic perforated appendicitis with gangrene and feculent peritonitis at the hepatic flexure.  Abscess in this region overlying the right kidney  Had abscess in the lesser sac with large volume of pus evacuated through hepatogastric ligament..  Purulent ascites in left upper quadrant retrosplenic space.  Colon without any evidence of perforation from cecum down to peritoneal  reflection.  CASE DATA:  Type of patient?: LDOW CASE (Surgical Hospitalist [facility] Inpatient)  Status of Case? EMERGENT Add On  Infection Present At Time Of Surgery (PATOS)?  ABSCESS, PURULENCE, FECULENT PERITONITIS  Assessment  Recovering  [facility] Stay = 5 days)  Plan:  Advance diet.  Continue IV antibiotics.  Bacterial and fungal control.  We continue 5 more days given persistent leukocytosis.  Okay to remove the left upper quadrant drain which is the upper left-sided abdominal drain.  Keep the left lower quadrant one since that is near the site of the appendectomy.  Possibly removed before discharge.  Low threshold to get repeat CT scan postop day 5-7 if persistent leukocytosis or does not resolve.  Consider retart PO meds.  Defer to medicine consultation.  Pulmonary issues seem to be improving.  Most likely reactive secondary to massive peritonitis and upper abdominal abscesses severely perforated gangrene with appendicitis  -VTE prophylaxis- SCDs, etc -mobilize as tolerated to help recovery  25 minutes spent in review, evaluation, examination, counseling, and coordination of care.  More than 50% of that time was spent in counseling.  10/03/2020    Subjective: (Chief complaint)  Patient feeling better overall.  Husband in room.  Tolerated liquids.  Started to walk in the hallways twice.  Objective:  Vital signs:  Vitals:   10/02/20 1500 10/02/20 2041 10/03/20 0526 10/03/20 0533  BP: (!) 156/75 (!) 167/78 (!) 165/85   Pulse: 92 98 88   Resp: 18 16 16    Temp: 97.7 F (36.5 C) 98.1 F (36.7 C) 97.7 F (36.5 C)   TempSrc: Oral  Oral   SpO2: 93% 93% 93%   Weight:    76.8 kg  Height:        Last BM Date:  (PTA)  Intake/Output   Yesterday:  11/27 0701 - 11/28 0700 In: 1445.6 [P.O.:240; I.V.:629.6; IV Piggyback:576] Out: 550 [Urine:550] This shift:  No intake/output data recorded.  Bowel function:  Flatus: YES  BM:  No  Drain:  Serosanguinous   Physical Exam:  General: Pt awake/alert in no acute distress Eyes: PERRL, normal EOM.  Sclera clear.  No icterus Neuro: CN II-XII intact w/o focal sensory/motor deficits. Lymph: No head/neck/groin lymphadenopathy Psych:  No delerium/psychosis/paranoia.  Oriented x 4 HENT: Normocephalic, Mucus membranes moist.  No thrush Neck: Supple, No tracheal deviation.  No obvious thyromegaly Chest: No pain to chest wall compression.  Good respiratory excursion.  No audible wheezing CV:  Pulses intact.  Regular rhythm.  No major extremity edema MS: Normal AROM mjr joints.  No obvious deformity  Abdomen: Soft.  Moderately distended.  Mildly tender at incisions only.  No evidence of peritonitis.  No incarcerated hernias.  Ext:  No deformity.  No mjr edema.  No cyanosis Skin: No petechiae / purpurea.  No major sores.  Warm and dry    Results:   Cultures: Recent Results (from the past 720 hour(s))  Resp Panel by RT-PCR (Flu A&B, Covid) Nasopharyngeal Swab     Status: None   Collection Time: 09/28/20  9:54 AM   Specimen: Nasopharyngeal Swab; Nasopharyngeal(NP) swabs in vial transport medium  Result Value Ref Range Status   SARS Coronavirus 2 by RT PCR NEGATIVE NEGATIVE Final    Comment: (NOTE) SARS-CoV-2 target nucleic acids are NOT DETECTED.  The SARS-CoV-2 RNA is generally detectable in upper respiratory specimens during the acute phase of infection. The lowest concentration of SARS-CoV-2 viral copies this assay can detect is 138 copies/mL. A negative result does not preclude SARS-Cov-2 infection and should not be used as the sole basis for treatment or other patient management decisions. A negative result may occur with  improper specimen collection/handling, submission of specimen other than nasopharyngeal swab, presence of viral mutation(s) within the areas targeted by this assay, and inadequate number of viral copies(<138 copies/mL). A negative result must be combined  with clinical observations, patient history, and epidemiological information. The expected result is Negative.  Fact Sheet for Patients:  EntrepreneurPulse.com.au  Fact Sheet for Healthcare Providers:  IncredibleEmployment.be  This test is no t yet approved or cleared by the [place] FDA and  has been authorized for detection and/or diagnosis of SARS-CoV-2 by FDA under an Emergency Use Authorization (EUA). This EUA will remain  in effect (meaning this test can be used) for the duration of the COVID-19 declaration under Section 564(b)(1) of the Act, 21 U.S.C.section 360bbb-3(b)(1), unless the authorization is terminated  or revoked sooner.       Influenza A by PCR NEGATIVE NEGATIVE Final   Influenza B by PCR NEGATIVE NEGATIVE Final    Comment: (NOTE) The Xpert Xpress SARS-CoV-2/FLU/RSV plus assay is intended as an aid in the diagnosis of influenza from Nasopharyngeal swab specimens and should not be used as a sole basis for treatment. Nasal washings and aspirates are unacceptable for Xpert Xpress SARS-CoV-2/FLU/RSV testing.  Fact Sheet for Patients: EntrepreneurPulse.com.au  Fact Sheet for Healthcare Providers: IncredibleEmployment.be  This test is not yet approved or cleared by the [place] FDA and has been authorized for detection and/or diagnosis of SARS-CoV-2 by FDA under an Emergency Use Authorization (EUA). This EUA will remain in effect (meaning this test can be used) for the duration of the COVID-19 declaration under Section 564(b)(1) of the Act, 21 U.S.C. section 360bbb-3(b)(1), unless the authorization is terminated or  revoked.  Performed at [facility], [place] [address], [place], [place] [address]   MRSA PCR Screening     Status: None   Collection Time: 09/28/20  8:15 PM   Specimen: Nasopharyngeal Wash  Result Value Ref Range Status   MRSA by PCR NEGATIVE  NEGATIVE Final    Comment:        The GeneXpert MRSA Assay (FDA approved for NASAL specimens only), is one component of a comprehensive MRSA colonization surveillance program. It is not intended to diagnose MRSA infection nor to guide or monitor treatment for MRSA infections. Performed at [facility], [place] [address], [place], [place] [address]     Labs: Results for orders placed or performed during the hospital encounter of 09/28/20 (from the past 48 hour(s))  CBC with Differential/Platelet     Status: Abnormal   Collection Time: 10/02/20  2:49 AM  Result Value Ref Range   WBC 19.0 (H) 4.0 - 10.5 K/uL    Comment: WHITE COUNT CONFIRMED ON SMEAR   RBC 3.60 (L) 3.87 - 5.11 MIL/uL   Hemoglobin 11.0 (L) 12.0 - 15.0 g/dL   HCT 34.0 (L) 36 - 46 %   MCV 94.4 80.0 - 100.0 fL   MCH 30.6 26.0 - 34.0 pg   MCHC 32.4 30.0 - 36.0 g/dL   RDW 14.0 11.5 - 15.5 %   Platelets 345 150 - 400 K/uL   nRBC 0.0 0.0 - 0.2 %   Neutrophils Relative % 81 %   Neutro Abs 15.3 (H) 1.7 - 7.7 K/uL   Lymphocytes Relative 9 %   Lymphs Abs 1.7 0.7 - 4.0 K/uL   Monocytes Relative 4 %   Monocytes Absolute 0.8 0.1 - 1.0 K/uL   Eosinophils Relative 1 %   Eosinophils Absolute 0.2 0.0 - 0.5 K/uL   Basophils Relative 0 %   Basophils Absolute 0.0 0.0 - 0.1 K/uL   Immature Granulocytes 5 %   Abs Immature Granulocytes 0.99 (H) 0.00 - 0.07 K/uL    Comment: Performed at [facility], [place] [address], [place], [place] [address]  Comprehensive metabolic panel     Status: Abnormal   Collection Time: 10/02/20  2:49 AM  Result Value Ref Range   Sodium 143 135 - 145 mmol/L   Potassium 3.3 (L) 3.5 - 5.1 mmol/L   Chloride 105 98 - 111 mmol/L   CO2 26 22 - 32 mmol/L   Glucose, Bld 84 70 - 99 mg/dL    Comment: Glucose reference range applies only to samples taken after fasting for at least 8 hours.   BUN 22 8 - 23 mg/dL   Creatinine, Ser 0.64 0.44 - 1.00 mg/dL   Calcium 8.0 (L) 8.9 -  10.3 mg/dL   Total Protein 5.6 (L) 6.5 - 8.1 g/dL   Albumin 2.0 (L) 3.5 - 5.0 g/dL   AST 31 15 - 41 U/L   ALT 33 0 - 44 U/L   Alkaline Phosphatase 114 38 - 126 U/L   Total Bilirubin 0.9 0.3 - 1.2 mg/dL   GFR, Estimated >60 >60 mL/min    Comment: (NOTE) Calculated using the CKD-EPI Creatinine Equation (2021)    Anion gap 12 5 - 15    Comment: Performed at [facility], [place] [address], [place],  [address]  Magnesium     Status: None   Collection Time: 10/02/20  2:49 AM  Result Value Ref Range   Magnesium 1.8 1.7 -  2.4 mg/dL    Comment: Performed at [facility], [place] [address], [place], [place] [address]  Phosphorus     Status: None   Collection Time: 10/02/20  2:49 AM  Result Value Ref Range   Phosphorus 3.2 2.5 - 4.6 mg/dL    Comment: Performed at [facility], [place] [address], [place], [place] [address]  CBC with Differential/Platelet     Status: Abnormal   Collection Time: 10/03/20  4:21 AM  Result Value Ref Range   WBC 17.5 (H) 4.0 - 10.5 K/uL   RBC 3.59 (L) 3.87 - 5.11 MIL/uL   Hemoglobin 10.9 (L) 12.0 - 15.0 g/dL   HCT 32.9 (L) 36 - 46 %   MCV 91.6 80.0 - 100.0 fL   MCH 30.4 26.0 - 34.0 pg   MCHC 33.1 30.0 - 36.0 g/dL   RDW 13.8 11.5 - 15.5 %   Platelets 369 150 - 400 K/uL   nRBC 0.0 0.0 - 0.2 %   Neutrophils Relative % 77 %   Neutro Abs 13.5 (H) 1.7 - 7.7 K/uL   Lymphocytes Relative 8 %   Lymphs Abs 1.4 0.7 - 4.0 K/uL   Monocytes Relative 5 %   Monocytes Absolute 0.9 0.1 - 1.0 K/uL   Eosinophils Relative 2 %   Eosinophils Absolute 0.3 0.0 - 0.5 K/uL   Basophils Relative 1 %   Basophils Absolute 0.1 0.0 - 0.1 K/uL   WBC Morphology MILD LEFT SHIFT (1-5% METAS, OCC MYELO, OCC BANDS)    Immature Granulocytes 7 %   Abs Immature Granulocytes 1.24 (H) 0.00 - 0.07 K/uL    Comment: Performed at [facility], [place] [address], [place], [place] [address]  Comprehensive metabolic panel      Status: Abnormal   Collection Time: 10/03/20  4:21 AM  Result Value Ref Range   Sodium 140 135 - 145 mmol/L   Potassium 3.3 (L) 3.5 - 5.1 mmol/L   Chloride 103 98 - 111 mmol/L   CO2 26 22 - 32 mmol/L   Glucose, Bld 94 70 - 99 mg/dL    Comment: Glucose reference range applies only to samples taken after fasting for at least 8 hours.   BUN 16 8 - 23 mg/dL   Creatinine, Ser 0.56 0.44 - 1.00 mg/dL   Calcium 8.0 (L) 8.9 - 10.3 mg/dL   Total Protein 5.6 (L) 6.5 - 8.1 g/dL   Albumin 2.0 (L) 3.5 - 5.0 g/dL   AST 27 15 - 41 U/L   ALT 25 0 - 44 U/L   Alkaline Phosphatase 97 38 - 126 U/L   Total Bilirubin 1.2 0.3 - 1.2 mg/dL   GFR, Estimated >60 >60 mL/min    Comment: (NOTE) Calculated using the CKD-EPI Creatinine Equation (2021)    Anion gap 11 5 - 15    Comment: Performed at [facility], [place] [address], [place], [place] [address]  Magnesium     Status: None   Collection Time: 10/03/20  4:21 AM  Result Value Ref Range   Magnesium 1.8 1.7 - 2.4 mg/dL    Comment: Performed at [facility], [place] [address], [place], [place] [address]  Phosphorus     Status: None   Collection Time: 10/03/20  4:21 AM  Result Value Ref Range   Phosphorus 3.1 2.5 - 4.6 mg/dL    Comment: Performed at [facility], [place] [address] [place], [place] [address]  Imaging / Studies: VAS [place] UPPER EXTREMITY VENOUS DUPLEX  Result Date: 10/01/2020 UPPER VENOUS STUDY  Indications: Swelling Anticoagulation: Lovenox. Limitations: Poor ultrasound/tissue interface due to swelling. Comparison Study: No prior studies. Performing Technologist: Darlin Coco, RDMSLo  Examination Guidelines: A complete evaluation includes B-mode imaging, spectral Doppler, color Doppler, and power Doppler as needed of all accessible portions of each vessel. Bilateral testing is considered an integral part of a complete examination. Limited examinations for reoccurring indications may be  performed as noted.  Right Findings: +----------+------------+---------+-----------+----------+---------------------+ RIGHT     CompressiblePhasicitySpontaneousProperties       Summary        +----------+------------+---------+-----------+----------+---------------------+ IJV           Full       Yes       Yes                                    +----------+------------+---------+-----------+----------+---------------------+ Subclavian    Full       Yes       Yes                                    +----------+------------+---------+-----------+----------+---------------------+ Axillary      Full       Yes       Yes                                    +----------+------------+---------+-----------+----------+---------------------+ Brachial      Full                                    Some segments not                                                          well visualized    +----------+------------+---------+-----------+----------+---------------------+ Radial        Full                                                        +----------+------------+---------+-----------+----------+---------------------+ Ulnar         Full                                                        +----------+------------+---------+-----------+----------+---------------------+ Cephalic      Full                                   Unable to assess at  AC due to bandages   +----------+------------+---------+-----------+----------+---------------------+ Basilic       Full                                   Unable to assess at                                                       [facility] due to bandages   +----------+------------+---------+-----------+----------+---------------------+  Left Findings: +----------+------------+---------+-----------+----------+-------+ LEFT       CompressiblePhasicitySpontaneousPropertiesSummary +----------+------------+---------+-----------+----------+-------+ Subclavian    Full       Yes       Yes                      +----------+------------+---------+-----------+----------+-------+  Summary:  Right: No evidence of deep vein thrombosis in the upper extremity. No evidence of superficial vein thrombosis in the upper extremity. However, some portions of this examination were limited. See technologist's comments above.  Left: No evidence of thrombosis in the subclavian.  *See table(s) above for measurements and observations.  Diagnosing physician: Jamelle Haring Electronically signed by Jamelle Haring on 10/01/2020 at 2:56:03 PM.    Final     Medications / Allergies: per chart  Antibiotics: Anti-infectives (From admission, onward)   Start     Dose/Rate Route Frequency Ordered Stop   10/02/20 1000  anidulafungin (ERAXIS) 100 mg in sodium chloride 0.9 % 100 mL IVPB        100 mg 78 mL/hr over 100 Minutes Intravenous Every 24 hours 10/01/20 0958     10/01/20 1100  anidulafungin (ERAXIS) 200 mg in sodium chloride 0.9 % 200 mL IVPB        200 mg 78 mL/hr over 200 Minutes Intravenous  Once 10/01/20 1014 10/01/20 1638   09/29/20 2200  piperacillin-tazobactam (ZOSYN) IVPB 3.375 g        3.375 g 12.5 mL/hr over 240 Minutes Intravenous Every 8 hours 09/29/20 1524 10/03/20 2159   09/28/20 2000  piperacillin-tazobactam (ZOSYN) IVPB 3.375 g  Status:  Discontinued        3.375 g 12.5 mL/hr over 240 Minutes Intravenous Every 8 hours 09/28/20 1937 09/29/20 1524   09/28/20 1830  clindamycin (CLEOCIN) 900 mg, gentamicin (GARAMYCIN) 240 mg in sodium chloride 0.9 % 1,000 mL for intraperitoneal lavage         Irrigation To Surgery 09/28/20 1736 09/28/20 1806   09/28/20 1630  clindamycin (CLEOCIN) 900 mg, gentamicin (GARAMYCIN) 240 mg in sodium chloride 0.9 % 1,000 mL for intraperitoneal lavage  Status:  Discontinued       Note to Pharmacy: Have  in the  [facility] room for final irrigation in bowel surgery case to minimize risk of abscess/infection Pharmacy may adjust dosing strength, schedule, rate of infusion, etc as needed to optimize therapy    Irrigation To Surgery 09/28/20 1618 09/28/20 1649   09/28/20 1600  cefoTEtan (CEFOTAN) 2 g in sodium chloride 0.9 % 100 mL IVPB        2 g 200 mL/hr over 30 Minutes Intravenous On call to O.R. 09/28/20 1320 09/28/20 1605   09/28/20 1145  piperacillin-tazobactam (ZOSYN) IVPB 3.375 g        3.375 g 100 mL/hr over 30 Minutes Intravenous  Once 09/28/20 1144 09/28/20  1353        Note: Portions of this report may have been transcribed using voice recognition software. Every effort was made to ensure accuracy; however, inadvertent computerized transcription errors may be present.   Any transcriptional errors that result from this process are unintentional.    Adin Hector, MD, FACS, MASCRS Gastrointestinal and Minimally Invasive Surgery  [facility] [address], [place], [place] [address] [phone] Fax [phone] Main/Paging  CONTACT INFORMATION: Weekday (9AM-5PM) concerns: Call [facility] main office at [phone] Weeknight (5PM-9AM) or Weekend/Holiday concerns: Check [email or web address] for General Surgery [facility] coverage (Please, do not use SecureChat as it is not reliable communication to operating surgeons for immediate patient care)      10/03/2020  8:54 AM

## 2020-10-03 NOTE — Progress Notes (Addendum)
Consult NOTE    Molly Manning  ZOX:[id number] DOB: 10/22/1941 DOA: 09/28/2020 PCP: Molly Pretty, MD   Chief Complaint  Patient presents with  . Chest Pain  . Abdominal Pain    Brief Narrative: Molly Manning is Molly Manning 79 y.o. female with medical history significant of HLD, overactive bladder. She presented on 11/23 with abdominal pain and was found to have CT imaging concerning for perforated appendicitis.  She's now s/p laparoscopic appendectomy with drainage of abscess and abdominal washout by surgery.  [facility] was consulted for medical management.   Assessment & Plan:   Principal Problem:   Gangrenous perforated appendicitis with generalized peritonitis & abscesses s/p lap appendectomy & washout 09/28/2020 Active Problems:   Atherosclerosis of coronary artery without angina pectoris   Hiatal hernia   Multiple nodules of lung   Psoriasis   Pure hypercholesterolemia   Pneumoperitoneum   Septic shock (HCC)   Acute appendicitis with generalized peritonitis, abscess, and gangrene   Hypomagnesemia   Hypokalemia  Sepsis secondary to gangrenous perforated appendicitis  Intraabdominal and Retroperitoneal Abscesses  Feculent Peritonitis     - per primary team     - s/p laparoscopic drainage of abscess x2, laparoscopic appendectomy, diagnostic laparoscopy with abdominal washout on 11/23     - follow surg path - pending     - NGT removed 11/27 per surgery, clears     - continue abx/antifungals per surgery (eraxis/zosyn per sugery - plan for 5 more days due to persistent leukocytosis per surgery)     - consider repeat CT scan post op day 5-7 if persistent leukocytosis doesn't resolve     - therapy eval  Acute Hypoxic Respiratory Failure  Community Acquired Pneumonia     - currently on RA     - CXR from 11/23 with patchy airspace opacities within L mid lung and L lung base concerning for pneumonia      - continue abx as above      - repeat CXR 11/25 -> persistent small effusions with  decreased aerations of both lower lung zones - bandlike atelectasis within L midlung      - continue IS  Leukocytosis: mild improvement - relatively stable today, low threshold for additional imaging  RUE Edema: RUE [place] negative for DVT  Elevated LFTs: continue to monitor post op, unclear etiology  Normocytic Anemia  Iron Def Anemia       - likely dilutional, possible component of blood loss from surgery, continue to monitor - labs c/w IDA  Hypertension: she's not on home BP meds, BP appropriate yesterday, continue to monitor for now, consider adding med if persistent   HLD     - crestor  Overactive bladder     - myrbetriq  Primary Prevention     - ASA   DVT prophylaxis: lovenox Code Status: full  Family Communication: husband at bedside 11/28 Disposition:   Status is: Inpatient  Remains inpatient appropriate because:Inpatient level of care appropriate due to severity of illness   Dispo: The patient is from: Home              Anticipated d/c is to: pending              Anticipated d/c date is: > 3 days              Patient currently is not medically stable to d/c.       Consultants:   Surgery is primary  [place] is consulting  Procedures:  s/p  laparoscopic drainage of abscess x2, laparoscopic appendectomy, diagnostic laparoscopy with abdominal washout on 11/23  Antimicrobials: Anti-infectives (From admission, onward)   Start     Dose/Rate Route Frequency Ordered Stop   10/02/20 1000  anidulafungin (ERAXIS) 100 mg in sodium chloride 0.9 % 100 mL IVPB        100 mg 78 mL/hr over 100 Minutes Intravenous Every 24 hours 10/01/20 0958 10/08/20 0856   10/01/20 1100  anidulafungin (ERAXIS) 200 mg in sodium chloride 0.9 % 200 mL IVPB        200 mg 78 mL/hr over 200 Minutes Intravenous  Once 10/01/20 1014 10/01/20 1638   09/29/20 2200  piperacillin-tazobactam (ZOSYN) IVPB 3.375 g        3.375 g 12.5 mL/hr over 240 Minutes Intravenous Every 8 hours 09/29/20 1524  10/08/20 0856   09/28/20 2000  piperacillin-tazobactam (ZOSYN) IVPB 3.375 g  Status:  Discontinued        3.375 g 12.5 mL/hr over 240 Minutes Intravenous Every 8 hours 09/28/20 1937 09/29/20 1524   09/28/20 1830  clindamycin (CLEOCIN) 900 mg, gentamicin (GARAMYCIN) 240 mg in sodium chloride 0.9 % 1,000 mL for intraperitoneal lavage         Irrigation To Surgery 09/28/20 1736 09/28/20 1806   09/28/20 1630  clindamycin (CLEOCIN) 900 mg, gentamicin (GARAMYCIN) 240 mg in sodium chloride 0.9 % 1,000 mL for intraperitoneal lavage  Status:  Discontinued       Note to Pharmacy: Have in the  OR room for final irrigation in bowel surgery case to minimize risk of abscess/infection Pharmacy may adjust dosing strength, schedule, rate of infusion, etc as needed to optimize therapy    Irrigation To Surgery 09/28/20 1618 09/28/20 1649   09/28/20 1600  cefoTEtan (CEFOTAN) 2 g in sodium chloride 0.9 % 100 mL IVPB        2 g 200 mL/hr over 30 Minutes Intravenous On call to O.R. 09/28/20 1320 09/28/20 1605   09/28/20 1145  piperacillin-tazobactam (ZOSYN) IVPB 3.375 g        3.375 g 100 mL/hr over 30 Minutes Intravenous  Once 09/28/20 1144 09/28/20 1353     Subjective: Feeling progressively better  Objective: Vitals:   10/02/20 2041 10/03/20 0526 10/03/20 0533 10/03/20 1319  BP: (!) 167/78 (!) 165/85  (!) 160/76  Pulse: 98 88  98  Resp: 16 16  18   Temp: 98.1 F (36.7 C) 97.7 F (36.5 C)  98 F (36.7 C)  TempSrc:  Oral  Oral  SpO2: 93% 93%  (!) 87%  Weight:   76.8 kg   Height:        Intake/Output Summary (Last 24 hours) at 10/03/2020 1453 Last data filed at 10/03/2020 0900 Gross per 24 hour  Intake 741.77 ml  Output 250 ml  Net 491.77 ml   Filed Weights   09/30/20 0342 10/02/20 0652 10/03/20 0533  Weight: 78.1 kg 79.2 kg 76.8 kg    Examination:  General: No acute distress. Cardiovascular: Heart sounds show Molly Manning regular rate, and rhythm.  Lungs: Clear to auscultation bilaterally    Abdomen: soft, mildly distended, appropriately tender, dressings intact Neurological: Alert and oriented 3. Moves all extremities 4. Cranial nerves II through XII grossly intact. Skin: Warm and dry. No rashes or lesions. Extremities: No clubbing or cyanosis. No edema.   Data Reviewed: I have personally reviewed following labs and imaging studies  CBC: Recent Labs  Lab 09/29/20 0232 09/30/20 0320 10/01/20 0347 10/02/20 0249 10/03/20 0421  WBC 6.4  11.7* 20.9* 19.0* 17.5*  NEUTROABS  --   --  17.8* 15.3* 13.5*  HGB 10.8* 10.1* 10.5* 11.0* 10.9*  HCT 32.9* 30.4* 31.1* 34.0* 32.9*  MCV 94.3 93.5 91.7 94.4 91.6  PLT 258 254 282 345 628    Basic Metabolic Panel: Recent Labs  Lab 09/29/20 0232 09/30/20 0320 10/01/20 0347 10/02/20 0249 10/03/20 0421  NA 140 141 141 143 140  K 4.0 3.8 3.6 3.3* 3.3*  CL 108 108 108 105 103  CO2 22 24 22 26 26   GLUCOSE 112* 115* 72 84 94  BUN 34* 45* 33* 22 16  CREATININE 0.95 0.95 0.75 0.64 0.56  CALCIUM 7.5* 8.1* 8.1* 8.0* 8.0*  MG 1.7 2.6* 2.1 1.8 1.8  PHOS 3.2 3.7 2.8 3.2 3.1    GFR: Estimated Creatinine Clearance: 60.7 mL/min (by C-G formula based on SCr of 0.56 mg/dL).  Liver Function Tests: Recent Labs  Lab 09/28/20 0850 09/30/20 0320 10/01/20 0347 10/02/20 0249 10/03/20 0421  AST 28 123* 57* 31 27  ALT 21 71* 48* 33 25  ALKPHOS 107 87 100 114 97  BILITOT 0.9 1.0 0.9 0.9 1.2  PROT 7.7 5.7* 5.4* 5.6* 5.6*  ALBUMIN 3.2* 2.1* 1.9* 2.0* 2.0*    CBG: No results for input(s): GLUCAP in the last 168 hours.   Recent Results (from the past 240 hour(s))  Resp Panel by RT-PCR (Flu [place] Gangemi&B, Covid) Nasopharyngeal Swab     Status: None   Collection Time: 09/28/20  9:54 AM   Specimen: Nasopharyngeal Swab; Nasopharyngeal(NP) swabs in vial transport medium  Result Value Ref Range Status   SARS Coronavirus 2 by RT PCR NEGATIVE NEGATIVE Final    Comment: (NOTE) SARS-CoV-2 target nucleic acids are NOT DETECTED.  The SARS-CoV-2 RNA is  generally detectable in upper respiratory specimens during the acute phase of infection. The lowest concentration of SARS-CoV-2 viral copies this assay can detect is 138 copies/mL. Jaynie Hitch negative result does not preclude SARS-Cov-2 infection and should not be used as the sole basis for treatment or other patient management decisions. Michaeal Davis negative result may occur with  improper specimen collection/handling, submission of specimen other than nasopharyngeal swab, presence of viral mutation(s) within the areas targeted by this assay, and inadequate number of viral copies(<138 copies/mL). Erabella Kuipers negative result must be combined with clinical observations, patient history, and epidemiological information. The expected result is Negative.  Fact Sheet for Patients:  EntrepreneurPulse.com.au  Fact Sheet for Healthcare Providers:  IncredibleEmployment.be  This test is no t yet approved or cleared by the [place] FDA and  has been authorized for detection and/or diagnosis of SARS-CoV-2 by FDA under an Emergency Use Authorization (EUA). This EUA will remain  in effect (meaning this test can be used) for the duration of the COVID-19 declaration under Section 564(b)(1) of the Act, 21 U.S.C.section 360bbb-3(b)(1), unless the authorization is terminated  or revoked sooner.       Influenza [place] by PCR NEGATIVE NEGATIVE Final   Influenza B by PCR NEGATIVE NEGATIVE Final    Comment: (NOTE) The Xpert Xpress SARS-CoV-2/FLU/RSV plus assay is intended as an aid in the diagnosis of influenza from Nasopharyngeal swab specimens and should not be used as Ashlei Chinchilla sole basis for treatment. Nasal washings and aspirates are unacceptable for Xpert Xpress SARS-CoV-2/FLU/RSV testing.  Fact Sheet for Patients: EntrepreneurPulse.com.au  Fact Sheet for Healthcare Providers: IncredibleEmployment.be  This test is not yet approved or cleared by the [place] FDA and has been authorized for detection and/or diagnosis  of SARS-CoV-2 by FDA under an Emergency Use Authorization (EUA). This EUA will remain in effect (meaning this test can be used) for the duration of the COVID-19 declaration under Section 564(b)(1) of the Act, 21 U.S.C. section 360bbb-3(b)(1), unless the authorization is terminated or revoked.  Performed at [facility], [place] [address], [place], [place] [address]   MRSA PCR Screening     Status: None   Collection Time: 09/28/20  8:15 PM   Specimen: Nasopharyngeal Wash  Result Value Ref Range Status   MRSA by PCR NEGATIVE NEGATIVE Final    Comment:        The GeneXpert MRSA Assay (FDA approved for NASAL specimens only), is one component of [facility] [place] comprehensive MRSA colonization surveillance program. It is not intended to diagnose MRSA infection nor to guide or monitor treatment for MRSA infections. Performed at [facility], [place] [address], [place], [place] [address]          Radiology Studies: No results found.      Scheduled Meds: . acetaminophen  1,000 mg Oral TID  . aspirin  81 mg Oral Daily  . Chlorhexidine Gluconate Cloth  6 each Topical Daily  . enoxaparin (LOVENOX) injection  40 mg Subcutaneous Q24H  . gabapentin  300 mg Oral QHS  . lip balm  1 application Topical BID  . mouth rinse  15 mL Mouth Rinse BID  . mirabegron ER  50 mg Oral Daily  . multivitamin with minerals  1 tablet Oral Daily  . pantoprazole (PROTONIX) IV  40 mg Intravenous QHS  . potassium chloride  40 mEq Oral Daily  . psyllium  1 packet Oral BID  . rosuvastatin  10 mg Oral Daily  . sodium chloride flush  3 mL Intravenous Q12H   Continuous Infusions: . sodium chloride 250 mL (10/02/20 0046)  . sodium chloride    . anidulafungin 100 mg (10/03/20 1024)  . lactated ringers    . lactated ringers    . methocarbamol (ROBAXIN) IV    . ondansetron (ZOFRAN) IV    . piperacillin-tazobactam  (ZOSYN)  IV 3.375 g (10/03/20 1440)     LOS: 5 days    Time spent: over 30 min    Fayrene Helper, MD [facility]   To contact the attending provider between 7A-7P or the covering provider during after hours 7P-7A, please log into the web site [email or web address] and access using universal [place] password for that web site. If you do not have the password, please call the hospital operator.  10/03/2020, 2:53 PM

## 2020-10-03 NOTE — Progress Notes (Signed)
Occupational Therapy Treatment Patient Details Name: Molly Manning MRN: [id number] DOB: [birth year] Today's Date: 10/03/2020    History of present illness 79 y.o. female admitted with gangrenous perforated appendicitis & abscess. s/p laparoscopic appendectomy and washout 09/28/20. PMH of HLD.   OT comments  Pt very agreeable and doing well. Husband will provide A as needed  Follow Up Recommendations  No OT follow up;Supervision - Intermittent    Equipment Recommendations  Tub/shower seat    Recommendations for Other Services      Precautions / Restrictions Precautions Precautions: Other (comment) Precaution Comments: abdominal surgery, instructed pt in log roll, NG tube, 2 drains       Mobility Bed Mobility Overal bed mobility: Needs Assistance Bed Mobility: Rolling;Sidelying to Sit Rolling: Supervision            Transfers Overall transfer level: Needs assistance Equipment used: 1 person hand held assist Transfers: Sit to/from Stand;Stand Pivot Transfers Sit to Stand: Min guard Stand pivot transfers: Min guard            Balance Overall balance assessment: Modified Independent   Sitting balance-Leahy Scale: Good       Standing balance-Leahy Scale: Fair                             ADL either performed or assessed with clinical judgement   ADL   Eating/Feeding: Set up;Sitting   Grooming: Oral care;Wash/dry face;Wash/dry hands;Brushing hair;Supervision/safety;Standing                   Toilet Transfer: Supervision/safety;Cueing for safety;Ambulation;Comfort height toilet;RW   Toileting- Clothing Manipulation and Hygiene: Supervision/safety;Sitting/lateral lean;Sit to/from stand         General ADL Comments: husband very supportive               Cognition Arousal/Alertness: Awake/alert Behavior During Therapy: WFL for tasks assessed/performed Overall Cognitive Status: Within Functional Limits for tasks assessed                                                      Pertinent Vitals/ Pain       Pain Score: 3  Pain Location: abdomen w Pain Descriptors / Indicators: Dull;Sore Pain Intervention(s): Limited activity within patient's tolerance     Prior Functioning/Environment              Frequency  Min 2X/week        Progress Toward Goals  OT Goals(current goals can now be found in the care plan section)  Progress towards OT goals: Progressing toward goals  Acute Rehab OT Goals Patient Stated Goal: tennis, golf OT Goal Formulation: With patient Time For Goal Achievement: 10/14/20 Potential to Achieve Goals: Good  Plan Discharge plan remains appropriate       AM-PAC OT "6 Clicks" Daily Activity     Outcome Measure   Help from another person eating meals?: None Help from another person taking care of personal grooming?: A Little Help from another person toileting, which includes using toliet, bedpan, or urinal?: A Little Help from another person bathing (including washing, rinsing, drying)?: A Lot Help from another person to put on and taking off regular upper body clothing?: A Little Help from another person to put on and taking off regular lower body clothing?: A Little  6 Click Score: 18    End of Session Equipment Utilized During Treatment: Rolling walker  OT Visit Diagnosis: Muscle weakness (generalized) (M62.81) Pain - part of body:  (abdomen)   Activity Tolerance Patient tolerated treatment well   Patient Left in chair;with call bell/phone within reach;Other (comment) (PT present)   Nurse Communication Mobility status        Time: 6387-5643 OT Time Calculation (min): 17 min  Charges: OT General Charges $OT Visit: 1 Visit OT Treatments $Self Care/Home Management : 8-22 mins  Kari Baars, [place] [phone] Office- [phone], Edwena Felty D 10/03/2020, 1:33 PM

## 2020-10-04 ENCOUNTER — Inpatient Hospital Stay (HOSPITAL_COMMUNITY): Payer: Medicare Other

## 2020-10-04 DIAGNOSIS — K3521 Acute appendicitis with generalized peritonitis, with abscess: Secondary | ICD-10-CM | POA: Diagnosis not present

## 2020-10-04 LAB — CBC WITH DIFFERENTIAL/PLATELET
Abs Immature Granulocytes: 1.17 10*3/uL — ABNORMAL HIGH (ref 0.00–0.07)
Basophils Absolute: 0.1 10*3/uL (ref 0.0–0.1)
Basophils Relative: 1 %
Eosinophils Absolute: 0.5 10*3/uL (ref 0.0–0.5)
Eosinophils Relative: 2 %
HCT: 31.8 % — ABNORMAL LOW (ref 36.0–46.0)
Hemoglobin: 10.1 g/dL — ABNORMAL LOW (ref 12.0–15.0)
Immature Granulocytes: 6 %
Lymphocytes Relative: 7 %
Lymphs Abs: 1.5 10*3/uL (ref 0.7–4.0)
MCH: 30.4 pg (ref 26.0–34.0)
MCHC: 31.8 g/dL (ref 30.0–36.0)
MCV: 95.8 fL (ref 80.0–100.0)
Monocytes Absolute: 0.8 10*3/uL (ref 0.1–1.0)
Monocytes Relative: 4 %
Neutro Abs: 16.2 10*3/uL — ABNORMAL HIGH (ref 1.7–7.7)
Neutrophils Relative %: 80 %
Platelets: 366 10*3/uL (ref 150–400)
RBC: 3.32 MIL/uL — ABNORMAL LOW (ref 3.87–5.11)
RDW: 14.1 % (ref 11.5–15.5)
WBC: 20.3 10*3/uL — ABNORMAL HIGH (ref 4.0–10.5)
nRBC: 0 % (ref 0.0–0.2)

## 2020-10-04 LAB — COMPREHENSIVE METABOLIC PANEL
ALT: 19 U/L (ref 0–44)
AST: 22 U/L (ref 15–41)
Albumin: 1.9 g/dL — ABNORMAL LOW (ref 3.5–5.0)
Alkaline Phosphatase: 85 U/L (ref 38–126)
Anion gap: 9 (ref 5–15)
BUN: 14 mg/dL (ref 8–23)
CO2: 29 mmol/L (ref 22–32)
Calcium: 8 mg/dL — ABNORMAL LOW (ref 8.9–10.3)
Chloride: 101 mmol/L (ref 98–111)
Creatinine, Ser: 0.54 mg/dL (ref 0.44–1.00)
GFR, Estimated: >60 mL/min (ref >60)
Glucose, Bld: 99 mg/dL (ref 70–99)
Potassium: 4.4 mmol/L (ref 3.5–5.1)
Sodium: 139 mmol/L (ref 135–145)
Total Bilirubin: 1 mg/dL (ref 0.3–1.2)
Total Protein: 5.5 g/dL — ABNORMAL LOW (ref 6.5–8.1)

## 2020-10-04 LAB — MAGNESIUM: Magnesium: 2.1 mg/dL (ref 1.7–2.4)

## 2020-10-04 LAB — PHOSPHORUS: Phosphorus: 3.1 mg/dL (ref 2.5–4.6)

## 2020-10-04 MED ORDER — IOHEXOL 300 MG/ML  SOLN
100.0000 mL | Freq: Once | INTRAMUSCULAR | Status: AC | PRN
  Administered 2020-10-04: 100 mL via INTRAVENOUS

## 2020-10-04 MED ORDER — IOHEXOL 9 MG/ML PO SOLN
500.0000 mL | ORAL | Status: AC
  Administered 2020-10-04 (×2): 500 mL via ORAL

## 2020-10-04 NOTE — Plan of Care (Signed)
  Problem: Education: Goal: Knowledge of General Education information will improve Description: Including pain rating scale, medication(s)/side effects and non-pharmacologic comfort measures Outcome: Progressing   Problem: Clinical Measurements: Goal: Ability to maintain clinical measurements within normal limits will improve Outcome: Progressing Goal: Will remain free from infection Outcome: Progressing Goal: Diagnostic test results will improve Outcome: Progressing Goal: Cardiovascular complication will be avoided Outcome: Progressing   Problem: Activity: Goal: Risk for activity intolerance will decrease Outcome: Progressing   Problem: Nutrition: Goal: Adequate nutrition will be maintained Outcome: Progressing   Problem: Coping: Goal: Level of anxiety will decrease Outcome: Progressing   Problem: Elimination: Goal: Will not experience complications related to bowel motility Outcome: Progressing   Problem: Pain Managment: Goal: General experience of comfort will improve Outcome: Progressing   Problem: Safety: Goal: Ability to remain free from injury will improve Outcome: Progressing

## 2020-10-04 NOTE — Progress Notes (Signed)
Consult NOTE    [name]  EUM:[id number] DOB: 1941/01/07 DOA: 09/28/2020 PCP: Deland Pretty, MD   Chief Complaint  Patient presents with  . Chest Pain  . Abdominal Pain    Brief Narrative: Molly Manning is Molly Manning 79 y.o. female with medical history significant of HLD, overactive bladder. She presented on 11/23 with abdominal pain and was found to have CT imaging concerning for perforated appendicitis.  She's now s/p laparoscopic appendectomy with drainage of abscess and abdominal washout by surgery.  [facility] was consulted for medical management.   Assessment & Plan:   Principal Problem:   Gangrenous perforated appendicitis with generalized peritonitis & abscesses s/p lap appendectomy & washout 09/28/2020 Active Problems:   Atherosclerosis of coronary artery without angina pectoris   Hiatal hernia   Multiple nodules of lung   Psoriasis   Pure hypercholesterolemia   Pneumoperitoneum   Septic shock (HCC)   Acute appendicitis with generalized peritonitis, abscess, and gangrene   Hypomagnesemia   Hypokalemia  Sepsis secondary to gangrenous perforated appendicitis  Intraabdominal and Retroperitoneal Abscesses  Feculent Peritonitis     - per primary team     - s/p laparoscopic drainage of abscess x2, laparoscopic appendectomy, diagnostic laparoscopy with abdominal washout on 11/23     - follow surg path - pending     - NGT removed 11/27 - diet per surgery     - JP drains removed, pending CT AP per surgery due to continued leukocytosis     - continue abx/antifungals per surgery (eraxis/zosyn per sugery - extended course due to leukocytosis - planned for 5 additional days from 11/28)     - therapy eval  Acute Hypoxic Respiratory Failure  Community Acquired Pneumonia     - currently on RA - improved     - CXR from 11/23 with patchy airspace opacities within L mid lung and L lung base concerning for pneumonia      - continue abx as above      - repeat CXR 11/25 -> persistent small  effusions with decreased aerations of both lower lung zones - bandlike atelectasis within L midlung      - continue IS  Leukocytosis: persistent, additional imaging as noted above  RUE Edema: RUE [place] negative for DVT, 2/2 IV infiltration  Elevated LFTs: continue to monitor post op, unclear etiology - resolved  Normocytic Anemia  Iron Def Anemia       - likely dilutional, possible component of blood loss from surgery, continue to monitor - labs c/w IDA, plan for PO iron when able  Elevated BP: she's not on home BP meds, BP fluctuating, consider adding antihypertensive if persistent  HLD     - crestor  Overactive bladder     - myrbetriq  Primary Prevention     - ASA   DVT prophylaxis: lovenox Code Status: full  Family Communication: husband at bedside 11/28 Disposition:   Status is: Inpatient  Remains inpatient appropriate because:Inpatient level of care appropriate due to severity of illness   Dispo: The patient is from: Home              Anticipated d/c is to: pending              Anticipated d/c date is: > 3 days              Patient currently is not medically stable to d/c.       Consultants:   Surgery is primary  Waldo is consulting  Procedures:  s/p laparoscopic drainage of abscess x2, laparoscopic appendectomy, diagnostic laparoscopy with abdominal washout on 11/23  Antimicrobials: Anti-infectives (From admission, onward)   Start     Dose/Rate Route Frequency Ordered Stop   10/02/20 1000  anidulafungin (ERAXIS) 100 mg in sodium chloride 0.9 % 100 mL IVPB        100 mg 78 mL/hr over 100 Minutes Intravenous Every 24 hours 10/01/20 0958 10/08/20 0856   10/01/20 1100  anidulafungin (ERAXIS) 200 mg in sodium chloride 0.9 % 200 mL IVPB        200 mg 78 mL/hr over 200 Minutes Intravenous  Once 10/01/20 1014 10/01/20 1638   09/29/20 2200  piperacillin-tazobactam (ZOSYN) IVPB 3.375 g        3.375 g 12.5 mL/hr over 240 Minutes Intravenous Every 8 hours  09/29/20 1524 10/08/20 0856   09/28/20 2000  piperacillin-tazobactam (ZOSYN) IVPB 3.375 g  Status:  Discontinued        3.375 g 12.5 mL/hr over 240 Minutes Intravenous Every 8 hours 09/28/20 1937 09/29/20 1524   09/28/20 1830  clindamycin (CLEOCIN) 900 mg, gentamicin (GARAMYCIN) 240 mg in sodium chloride 0.9 % 1,000 mL for intraperitoneal lavage         Irrigation To Surgery 09/28/20 1736 09/28/20 1806   09/28/20 1630  clindamycin (CLEOCIN) 900 mg, gentamicin (GARAMYCIN) 240 mg in sodium chloride 0.9 % 1,000 mL for intraperitoneal lavage  Status:  Discontinued       Note to Pharmacy: Have in the  OR room for final irrigation in bowel surgery case to minimize risk of abscess/infection Pharmacy may adjust dosing strength, schedule, rate of infusion, etc as needed to optimize therapy    Irrigation To Surgery 09/28/20 1618 09/28/20 1649   09/28/20 1600  cefoTEtan (CEFOTAN) 2 g in sodium chloride 0.9 % 100 mL IVPB        2 g 200 mL/hr over 30 Minutes Intravenous On call to O.R. 09/28/20 1320 09/28/20 1605   09/28/20 1145  piperacillin-tazobactam (ZOSYN) IVPB 3.375 g        3.375 g 100 mL/hr over 30 Minutes Intravenous  Once 09/28/20 1144 09/28/20 1353     Subjective: Feels better after BM this AM, was uncomfortable prior to that  Objective: Vitals:   10/03/20 0533 10/03/20 1319 10/03/20 2148 10/04/20 0614  BP:  (!) 160/76 138/66 (!) 156/78  Pulse:  98 93 94  Resp:  18 20 18   Temp:  98 F (36.7 C) 98 F (36.7 C) 98 F (36.7 C)  TempSrc:  Oral Oral Oral  SpO2:  (!) 87% 94% 93%  Weight: 76.8 kg     Height:        Intake/Output Summary (Last 24 hours) at 10/04/2020 1419 Last data filed at 10/04/2020 0912 Gross per 24 hour  Intake 968.62 ml  Output 800 ml  Net 168.62 ml   Filed Weights   09/30/20 0342 10/02/20 0652 10/03/20 0533  Weight: 78.1 kg 79.2 kg 76.8 kg    Examination:  General: No acute distress. Cardiovascular: Heart sounds show Kamrin Spath regular rate, and rhythm.  Lungs:  Clear to auscultation bilaterally Abdomen: Soft, mild distension.  Intact dressings. Neurological: Alert and oriented 3. Moves all extremities 4. Cranial nerves II through XII grossly intact. Skin: Warm and dry. No rashes or lesions. Extremities: No clubbing or cyanosis. No edema.   Data Reviewed: I have personally reviewed following labs and imaging studies  CBC: Recent Labs  Lab 09/30/20 0320 10/01/20 0347 10/02/20 0249  10/03/20 0421 10/04/20 0233  WBC 11.7* 20.9* 19.0* 17.5* 20.3*  NEUTROABS  --  17.8* 15.3* 13.5* 16.2*  HGB 10.1* 10.5* 11.0* 10.9* 10.1*  HCT 30.4* 31.1* 34.0* 32.9* 31.8*  MCV 93.5 91.7 94.4 91.6 95.8  PLT 254 282 345 369 099    Basic Metabolic Panel: Recent Labs  Lab 09/30/20 0320 10/01/20 0347 10/02/20 0249 10/03/20 0421 10/04/20 0233  NA 141 141 143 140 139  K 3.8 3.6 3.3* 3.3* 4.4  CL 108 108 105 103 101  CO2 24 22 26 26 29   GLUCOSE 115* 72 84 94 99  BUN 45* 33* 22 16 14   CREATININE 0.95 0.75 0.64 0.56 0.54  CALCIUM 8.1* 8.1* 8.0* 8.0* 8.0*  MG 2.6* 2.1 1.8 1.8 2.1  PHOS 3.7 2.8 3.2 3.1 3.1    GFR: Estimated Creatinine Clearance: 60.7 mL/min (by C-G formula based on SCr of 0.54 mg/dL).  Liver Function Tests: Recent Labs  Lab 09/30/20 0320 10/01/20 0347 10/02/20 0249 10/03/20 0421 10/04/20 0233  AST 123* 57* 31 27 22   ALT 71* 48* 33 25 19  ALKPHOS 87 100 114 97 85  BILITOT 1.0 0.9 0.9 1.2 1.0  PROT 5.7* 5.4* 5.6* 5.6* 5.5*  ALBUMIN 2.1* 1.9* 2.0* 2.0* 1.9*    CBG: No results for input(s): GLUCAP in the last 168 hours.   Recent Results (from the past 240 hour(s))  Resp Panel by RT-PCR (Flu Shylo Dillenbeck&B, Covid) Nasopharyngeal Swab     Status: None   Collection Time: 09/28/20  9:54 AM   Specimen: Nasopharyngeal Swab; Nasopharyngeal(NP) swabs in vial transport medium  Result Value Ref Range Status   SARS Coronavirus 2 by RT PCR NEGATIVE NEGATIVE Final    Comment: (NOTE) SARS-CoV-2 target nucleic acids are NOT DETECTED.  The  SARS-CoV-2 RNA is generally detectable in upper respiratory specimens during the acute phase of infection. The lowest concentration of SARS-CoV-2 viral copies this assay can detect is 138 copies/mL. Jamia Hoban negative result does not preclude SARS-Cov-2 infection and should not be used as the sole basis for treatment or other patient management decisions. Briggette Najarian negative result may occur with  improper specimen collection/handling, submission of specimen other than nasopharyngeal swab, presence of viral mutation(s) within the areas targeted by this assay, and inadequate number of viral copies(<138 copies/mL). Catina Nuss negative result must be combined with clinical observations, patient history, and epidemiological information. The expected result is Negative.  Fact Sheet for Patients:  EntrepreneurPulse.com.au  Fact Sheet for Healthcare Providers:  IncredibleEmployment.be  This test is no t yet approved or cleared by the [place] FDA and  has been authorized for detection and/or diagnosis of SARS-CoV-2 by FDA under an Emergency Use Authorization (EUA). This EUA will remain  in effect (meaning this test can be used) for the duration of the COVID-19 declaration under Section 564(b)(1) of the Act, 21 U.S.C.section 360bbb-3(b)(1), unless the authorization is terminated  or revoked sooner.       Influenza Collan Schoenfeld by PCR NEGATIVE NEGATIVE Final   Influenza B by PCR NEGATIVE NEGATIVE Final    Comment: (NOTE) The Xpert Xpress SARS-CoV-2/FLU/RSV plus assay is intended as an aid in the diagnosis of influenza from Nasopharyngeal swab specimens and should not be used as Demarus Latterell sole basis for treatment. Nasal washings and aspirates are unacceptable for Xpert Xpress SARS-CoV-2/FLU/RSV testing.  Fact Sheet for Patients: EntrepreneurPulse.com.au  Fact Sheet for Healthcare Providers: IncredibleEmployment.be  This test is not yet approved or  cleared by the [place] FDA and has been  authorized for detection and/or diagnosis of SARS-CoV-2 by FDA under an Emergency Use Authorization (EUA). This EUA will remain in effect (meaning this test can be used) for the duration of the COVID-19 declaration under Section 564(b)(1) of the Act, 21 U.S.C. section 360bbb-3(b)(1), unless the authorization is terminated or revoked.  Performed at [facility], [place] [address] [place], [place] [address]   MRSA PCR Screening     Status: None   Collection Time: 09/28/20  8:15 PM   Specimen: Nasopharyngeal Wash  Result Value Ref Range Status   MRSA by PCR NEGATIVE NEGATIVE Final    Comment:        The GeneXpert MRSA Assay (FDA approved for NASAL specimens only), is one component of Lenn Volker comprehensive MRSA colonization surveillance program. It is not intended to diagnose MRSA infection nor to guide or monitor treatment for MRSA infections. Performed at [facility], [place] [address] [place], [place] [address]          Radiology Studies: No results found.      Scheduled Meds: . acetaminophen  1,000 mg Oral TID  . aspirin  81 mg Oral Daily  . Chlorhexidine Gluconate Cloth  6 each Topical Daily  . enoxaparin (LOVENOX) injection  40 mg Subcutaneous Q24H  . gabapentin  300 mg Oral QHS  . lip balm  1 application Topical BID  . mouth rinse  15 mL Mouth Rinse BID  . mirabegron ER  50 mg Oral Daily  . multivitamin with minerals  1 tablet Oral Daily  . pantoprazole (PROTONIX) IV  40 mg Intravenous QHS  . potassium chloride  40 mEq Oral Daily  . psyllium  1 packet Oral BID  . rosuvastatin  10 mg Oral Daily  . sodium chloride flush  3 mL Intravenous Q12H   Continuous Infusions: . sodium chloride Stopped (10/04/20 0513)  . sodium chloride    . anidulafungin 100 mg (10/04/20 0912)  . lactated ringers    . lactated ringers    . methocarbamol (ROBAXIN) IV    . ondansetron (ZOFRAN) IV    .  piperacillin-tazobactam (ZOSYN)  IV 3.375 g (10/04/20 1323)     LOS: 6 days    Time spent: over 30 min    Fayrene Helper, MD [facility]   To contact the attending provider between 7A-7P or the covering provider during after hours 7P-7A, please log into the web site [email or web address] and access using universal [place] password for that web site. If you do not have the password, please call the hospital operator.  10/04/2020, 2:19 PM

## 2020-10-04 NOTE — Progress Notes (Signed)
Progress Note  6 Days Post-Op  Subjective: Patient reports she is not feeling great. Still having soreness in RLQ. Reports both drains were removed overnight. She denies nausea but is not very hungry this AM. Having loose BMs. Husband at bedside.   Objective: Vital signs in last 24 hours: Temp:  [98 F (36.7 C)] 98 F (36.7 C) (11/29 4174) Pulse Rate:  [93-98] 94 (11/29 0614) Resp:  [18-20] 18 (11/29 0614) BP: (138-160)/(66-78) 156/78 (11/29 0614) SpO2:  [87 %-94 %] 93 % (11/29 0614) Last BM Date: 10/03/20  Intake/Output from previous day: 11/28 0701 - 11/29 0700 In: 1022.3 [P.O.:600; I.V.:82.8; IV Piggyback:339.5] Out: 1100 [Urine:1100] Intake/Output this shift: Total I/O In: 120 [P.O.:120] Out: -   PE: General: pleasant, WD, elderly female who is laying in bed in NAD Heart: regular, rate, and rhythm.  Lungs: CTAB, no wheezes, rhonchi, or rales noted.  Respiratory effort nonlabored Abd: soft, ttp in RLQ without peritonitis, ND, +BS, incisions c/d/i, both drains have been removed   Lab Results:  Recent Labs    10/03/20 0421 10/04/20 0233  WBC 17.5* 20.3*  HGB 10.9* 10.1*  HCT 32.9* 31.8*  PLT 369 366   BMET Recent Labs    10/03/20 0421 10/04/20 0233  NA 140 139  K 3.3* 4.4  CL 103 101  CO2 26 29  GLUCOSE 94 99  BUN 16 14  CREATININE 0.56 0.54  CALCIUM 8.0* 8.0*   PT/INR No results for input(s): LABPROT, INR in the last 72 hours. CMP     Component Value Date/Time   NA 139 10/04/2020 0233   K 4.4 10/04/2020 0233   CL 101 10/04/2020 0233   CO2 29 10/04/2020 0233   GLUCOSE 99 10/04/2020 0233   BUN 14 10/04/2020 0233   CREATININE 0.54 10/04/2020 0233   CALCIUM 8.0 (L) 10/04/2020 0233   PROT 5.5 (L) 10/04/2020 0233   ALBUMIN 1.9 (L) 10/04/2020 0233   AST 22 10/04/2020 0233   ALT 19 10/04/2020 0233   ALKPHOS 85 10/04/2020 0233   BILITOT 1.0 10/04/2020 0233   GFRNONAA >60 10/04/2020 0233   Lipase     Component Value Date/Time   LIPASE 25  09/28/2020 0850       Studies/Results: No results found.  Anti-infectives: Anti-infectives (From admission, onward)   Start     Dose/Rate Route Frequency Ordered Stop   10/02/20 1000  anidulafungin (ERAXIS) 100 mg in sodium chloride 0.9 % 100 mL IVPB        100 mg 78 mL/hr over 100 Minutes Intravenous Every 24 hours 10/01/20 0958 10/08/20 0856   10/01/20 1100  anidulafungin (ERAXIS) 200 mg in sodium chloride 0.9 % 200 mL IVPB        200 mg 78 mL/hr over 200 Minutes Intravenous  Once 10/01/20 1014 10/01/20 1638   09/29/20 2200  piperacillin-tazobactam (ZOSYN) IVPB 3.375 g        3.375 g 12.5 mL/hr over 240 Minutes Intravenous Every 8 hours 09/29/20 1524 10/08/20 0856   09/28/20 2000  piperacillin-tazobactam (ZOSYN) IVPB 3.375 g  Status:  Discontinued        3.375 g 12.5 mL/hr over 240 Minutes Intravenous Every 8 hours 09/28/20 1937 09/29/20 1524   09/28/20 1830  clindamycin (CLEOCIN) 900 mg, gentamicin (GARAMYCIN) 240 mg in sodium chloride 0.9 % 1,000 mL for intraperitoneal lavage         Irrigation To Surgery 09/28/20 1736 09/28/20 1806   09/28/20 1630  clindamycin (CLEOCIN) 900 mg, gentamicin (  GARAMYCIN) 240 mg in sodium chloride 0.9 % 1,000 mL for intraperitoneal lavage  Status:  Discontinued       Note to Pharmacy: Have in the  OR room for final irrigation in bowel surgery case to minimize risk of abscess/infection Pharmacy may adjust dosing strength, schedule, rate of infusion, etc as needed to optimize therapy    Irrigation To Surgery 09/28/20 1618 09/28/20 1649   09/28/20 1600  cefoTEtan (CEFOTAN) 2 g in sodium chloride 0.9 % 100 mL IVPB        2 g 200 mL/hr over 30 Minutes Intravenous On call to O.R. 09/28/20 1320 09/28/20 1605   09/28/20 1145  piperacillin-tazobactam (ZOSYN) IVPB 3.375 g        3.375 g 100 mL/hr over 30 Minutes Intravenous  Once 09/28/20 1144 09/28/20 1353       Assessment/Plan HLD  OAB AKI - improved, Cr 0.54 ABL anemia - Hgb 10.1, stable  Severe  protein calorie malnutrition - prealbumin <5 (11/24).  Hypomagnesemia - 2.1 this AM  GANGREONOUSPERFORATEDAPPENDICITIS, INTRA ABDOMINAL& RETROPERITONEALABSESSES, FECULENT PERITONITIS -S/p LAPAROSCOPIC DRAINAGE OF ABSESS X 2 , LAPAROSCOPIC APPENDECTOMY, DIAGNOSTIC LAPAROSCOPYWITH ABDOMINAL WASHOUT 11/23 Dr. Johney Maine - POD#6 - surgical path pending - will touch base with path today  - both JP were removed yesterday  - WBC remains elevated at 20 - check CT A/P today, continue IV abx - NPO until CT back  - continue to mobilize as tolerated  ID -zosyn 11/23>> VTE -SCDs, lovenox  FEN -IVF, NPO Foley -removed 11/24 Follow up -Dr. Johney Maine   LOS: 6 days    Norm Parcel , [facility] Surgery 10/04/2020, 9:48 AM Please see Amion for pager number during day hours 7:00am-4:30pm

## 2020-10-04 NOTE — Treatment Plan (Signed)
CT with worsening of peritoneal fluid collections c/w abscesses (see report).  Will discuss with surgery.

## 2020-10-04 NOTE — Care Management Important Message (Signed)
Important Message  Patient Details IM Letter given to the Patient. Name: Molly Manning MRN: [id number] Date of Birth: 02-Mar-1941   Medicare Important Message Given:  Yes     Kerin Salen 10/04/2020, 11:36 AM

## 2020-10-05 ENCOUNTER — Inpatient Hospital Stay (HOSPITAL_COMMUNITY): Payer: Medicare Other

## 2020-10-05 DIAGNOSIS — K3521 Acute appendicitis with generalized peritonitis, with abscess: Secondary | ICD-10-CM | POA: Diagnosis not present

## 2020-10-05 LAB — COMPREHENSIVE METABOLIC PANEL
ALT: 18 U/L (ref 0–44)
AST: 23 U/L (ref 15–41)
Albumin: 2 g/dL — ABNORMAL LOW (ref 3.5–5.0)
Alkaline Phosphatase: 100 U/L (ref 38–126)
Anion gap: 13 (ref 5–15)
BUN: 9 mg/dL (ref 8–23)
CO2: 26 mmol/L (ref 22–32)
Calcium: 8.1 mg/dL — ABNORMAL LOW (ref 8.9–10.3)
Chloride: 101 mmol/L (ref 98–111)
Creatinine, Ser: 0.57 mg/dL (ref 0.44–1.00)
GFR, Estimated: >60 mL/min (ref >60)
Glucose, Bld: 80 mg/dL (ref 70–99)
Potassium: 3.7 mmol/L (ref 3.5–5.1)
Sodium: 140 mmol/L (ref 135–145)
Total Bilirubin: 1.3 mg/dL — ABNORMAL HIGH (ref 0.3–1.2)
Total Protein: 5.7 g/dL — ABNORMAL LOW (ref 6.5–8.1)

## 2020-10-05 LAB — CBC WITH DIFFERENTIAL/PLATELET
Abs Immature Granulocytes: 0.8 10*3/uL — ABNORMAL HIGH (ref 0.00–0.07)
Basophils Absolute: 0.1 10*3/uL (ref 0.0–0.1)
Basophils Relative: 0 %
Eosinophils Absolute: 0.2 10*3/uL (ref 0.0–0.5)
Eosinophils Relative: 1 %
HCT: 32.1 % — ABNORMAL LOW (ref 36.0–46.0)
Hemoglobin: 10.4 g/dL — ABNORMAL LOW (ref 12.0–15.0)
Immature Granulocytes: 5 %
Lymphocytes Relative: 7 %
Lymphs Abs: 1.1 10*3/uL (ref 0.7–4.0)
MCH: 30.7 pg (ref 26.0–34.0)
MCHC: 32.4 g/dL (ref 30.0–36.0)
MCV: 94.7 fL (ref 80.0–100.0)
Monocytes Absolute: 0.7 10*3/uL (ref 0.1–1.0)
Monocytes Relative: 4 %
Neutro Abs: 13.8 10*3/uL — ABNORMAL HIGH (ref 1.7–7.7)
Neutrophils Relative %: 83 %
Platelets: 428 10*3/uL — ABNORMAL HIGH (ref 150–400)
RBC: 3.39 MIL/uL — ABNORMAL LOW (ref 3.87–5.11)
RDW: 14 % (ref 11.5–15.5)
WBC: 16.7 10*3/uL — ABNORMAL HIGH (ref 4.0–10.5)
nRBC: 0 % (ref 0.0–0.2)

## 2020-10-05 LAB — PHOSPHORUS: Phosphorus: 2.9 mg/dL (ref 2.5–4.6)

## 2020-10-05 LAB — MAGNESIUM: Magnesium: 1.8 mg/dL (ref 1.7–2.4)

## 2020-10-05 LAB — SURGICAL PATHOLOGY

## 2020-10-05 MED ORDER — FENTANYL CITRATE (PF) 100 MCG/2ML IJ SOLN
INTRAMUSCULAR | Status: AC
  Filled 2020-10-05: qty 2

## 2020-10-05 MED ORDER — NALOXONE HCL 0.4 MG/ML IJ SOLN
INTRAMUSCULAR | Status: AC
  Filled 2020-10-05: qty 1

## 2020-10-05 MED ORDER — HYDROMORPHONE HCL 1 MG/ML IJ SOLN: 0.5000 mg | INTRAMUSCULAR | Status: DC | PRN

## 2020-10-05 MED ORDER — MIDAZOLAM HCL 2 MG/2ML IJ SOLN
INTRAMUSCULAR | Status: AC
  Filled 2020-10-05: qty 4

## 2020-10-05 MED ORDER — MIDAZOLAM HCL 2 MG/2ML IJ SOLN
INTRAMUSCULAR | Status: DC | PRN
  Administered 2020-10-05 (×2): 1 mg via INTRAVENOUS

## 2020-10-05 MED ORDER — SODIUM CHLORIDE 0.9% FLUSH
5.0000 mL | Freq: Three times a day (TID) | INTRAVENOUS | Status: DC
  Administered 2020-10-05 – 2020-10-12 (×20): 5 mL

## 2020-10-05 MED ORDER — FLUMAZENIL 0.5 MG/5ML IV SOLN
INTRAVENOUS | Status: AC
  Filled 2020-10-05: qty 5

## 2020-10-05 MED ORDER — LIDOCAINE HCL (PF) 1 % IJ SOLN
INTRAMUSCULAR | Status: DC | PRN
  Administered 2020-10-05: 10 mL via INTRADERMAL

## 2020-10-05 MED ORDER — FENTANYL CITRATE (PF) 100 MCG/2ML IJ SOLN
INTRAMUSCULAR | Status: DC | PRN
Start: 2020-10-05 — End: 2020-10-12
  Administered 2020-10-05 (×2): 50 ug via INTRAVENOUS

## 2020-10-05 NOTE — Progress Notes (Addendum)
Consult NOTE    Molly Manning  KNL:[id number] DOB: 1941/01/02 DOA: 09/28/2020 PCP: Deland Pretty, MD   Chief Complaint  Patient presents with  . Chest Pain  . Abdominal Pain    Brief Narrative: Molly Manning is Molly Manning 79 y.o. female with medical history significant of HLD, overactive bladder. She presented on 11/23 with abdominal pain and was found to have CT imaging concerning for perforated appendicitis.  She's now s/p laparoscopic appendectomy with drainage of abscess and abdominal washout by surgery.  [facility] was consulted for medical management.   Assessment & Plan:   Principal Problem:   Gangrenous perforated appendicitis with generalized peritonitis & abscesses s/p lap appendectomy & washout 09/28/2020 Active Problems:   Atherosclerosis of coronary artery without angina pectoris   Hiatal hernia   Multiple nodules of lung   Psoriasis   Pure hypercholesterolemia   Pneumoperitoneum   Septic shock (HCC)   Acute appendicitis with generalized peritonitis, abscess, and gangrene   Hypomagnesemia   Hypokalemia  Sepsis secondary to gangrenous perforated appendicitis  Intraabdominal and Retroperitoneal Abscesses  Feculent Peritonitis     - per primary team     - s/p laparoscopic drainage of abscess x2, laparoscopic appendectomy, diagnostic laparoscopy with abdominal washout on 11/23     - follow surg path - pending     - NGT removed 11/27 - diet per surgery     - CT with worsening of peritoneal fluid collections c/w abscesses (see report) - discussed with surgery 11/29 PM who noted they'd discuss with IR this am - continue NPO      - continue abx/antifungals per surgery (eraxis/zosyn per sugery)     - therapy eval  Acute Hypoxic Respiratory Failure  Community Acquired Pneumonia     - currently on RA - improved     - CXR from 11/23 with patchy airspace opacities within L mid lung and L lung base concerning for pneumonia      - continue abx as above      - repeat CXR 11/25 ->  persistent small effusions with decreased aerations of both lower lung zones - bandlike atelectasis within L midlung      - continue IS  Leukocytosis: improved, 2/2 abscesses above  RUE Edema: RUE [place] negative for DVT, 2/2 IV infiltration  Elevated LFTs: continue to monitor post op, unclear etiology - resolved  Normocytic Anemia  Iron Def Anemia       - likely dilutional, possible component of blood loss from surgery, continue to monitor - labs c/w IDA, plan for PO iron when able  Elevated BP: she's not on home BP meds, BP fluctuating, consider adding antihypertensive if persistent  HLD     - crestor  Overactive bladder     - myrbetriq  Primary Prevention     - ASA   DVT prophylaxis: lovenox Code Status: full  Family Communication: husband at bedside 11/29, none at bedside 11/30 Disposition:   Status is: Inpatient  Remains inpatient appropriate because:Inpatient level of care appropriate due to severity of illness   Dispo: The patient is from: Home              Anticipated d/c is to: pending              Anticipated d/c date is: > 3 days              Patient currently is not medically stable to d/c.       Consultants:   Surgery is  primary  TRH is consulting  Procedures:  s/p laparoscopic drainage of abscess x2, laparoscopic appendectomy, diagnostic laparoscopy with abdominal washout on 11/23  Antimicrobials: Anti-infectives (From admission, onward)   Start     Dose/Rate Route Frequency Ordered Stop   10/02/20 1000  anidulafungin (ERAXIS) 100 mg in sodium chloride 0.9 % 100 mL IVPB        100 mg 78 mL/hr over 100 Minutes Intravenous Every 24 hours 10/01/20 0958 10/08/20 0856   10/01/20 1100  anidulafungin (ERAXIS) 200 mg in sodium chloride 0.9 % 200 mL IVPB        200 mg 78 mL/hr over 200 Minutes Intravenous  Once 10/01/20 1014 10/01/20 1638   09/29/20 2200  piperacillin-tazobactam (ZOSYN) IVPB 3.375 g        3.375 g 12.5 mL/hr over 240 Minutes  Intravenous Every 8 hours 09/29/20 1524 10/08/20 0856   09/28/20 2000  piperacillin-tazobactam (ZOSYN) IVPB 3.375 g  Status:  Discontinued        3.375 g 12.5 mL/hr over 240 Minutes Intravenous Every 8 hours 09/28/20 1937 09/29/20 1524   09/28/20 1830  clindamycin (CLEOCIN) 900 mg, gentamicin (GARAMYCIN) 240 mg in sodium chloride 0.9 % 1,000 mL for intraperitoneal lavage         Irrigation To Surgery 09/28/20 1736 09/28/20 1806   09/28/20 1630  clindamycin (CLEOCIN) 900 mg, gentamicin (GARAMYCIN) 240 mg in sodium chloride 0.9 % 1,000 mL for intraperitoneal lavage  Status:  Discontinued       Note to Pharmacy: Have in the  [place] room for final irrigation in bowel surgery case to minimize risk of abscess/infection Pharmacy may adjust dosing strength, schedule, rate of infusion, etc as needed to optimize therapy    Irrigation To Surgery 09/28/20 1618 09/28/20 1649   09/28/20 1600  cefoTEtan (CEFOTAN) 2 g in sodium chloride 0.9 % 100 mL IVPB        2 g 200 mL/hr over 30 Minutes Intravenous On call to O.R. 09/28/20 1320 09/28/20 1605   09/28/20 1145  piperacillin-tazobactam (ZOSYN) IVPB 3.375 g        3.375 g 100 mL/hr over 30 Minutes Intravenous  Once 09/28/20 1144 09/28/20 1353     Subjective: Continues to have abdominal discomfort Passing gas, having BM  Objective: Vitals:   10/04/20 0614 10/04/20 1531 10/04/20 2205 10/05/20 0621  BP: (!) 156/78 (!) 178/78 (!) 169/78   Pulse: 94 97 88   Resp: 18 20 20    Temp: 98 F (36.7 C) 97.9 F (36.6 C) 97.8 F (36.6 C)   TempSrc: Oral Oral Oral   SpO2: 93% 97% 98%   Weight:    77.8 kg  Height:        Intake/Output Summary (Last 24 hours) at 10/05/2020 0828 Last data filed at 10/05/2020 3716 Gross per 24 hour  Intake 1370.23 ml  Output --  Net 1370.23 ml   Filed Weights   10/02/20 0652 10/03/20 0533 10/05/20 0621  Weight: 79.2 kg 76.8 kg 77.8 kg    Examination:  General: No acute distress. Cardiovascular: Heart sounds show Molly Manning  regular rate, and rhythm Lungs: unlabored Abdomen: Soft, mildly distended, mildly TTP Neurological: Alert and oriented 3. Moves all extremities 4. Cranial nerves II through XII grossly intact. Skin: Warm and dry. No rashes or lesions. Extremities: No clubbing or cyanosis. No edema.   Data Reviewed: I have personally reviewed following labs and imaging studies  CBC: Recent Labs  Lab 10/01/20 0347 10/02/20 0249 10/03/20 0421 10/04/20 9678  10/05/20 0355  WBC 20.9* 19.0* 17.5* 20.3* 16.7*  NEUTROABS 17.8* 15.3* 13.5* 16.2* 13.8*  HGB 10.5* 11.0* 10.9* 10.1* 10.4*  HCT 31.1* 34.0* 32.9* 31.8* 32.1*  MCV 91.7 94.4 91.6 95.8 94.7  PLT 282 345 369 366 428*    Basic Metabolic Panel: Recent Labs  Lab 10/01/20 0347 10/02/20 0249 10/03/20 0421 10/04/20 0233 10/05/20 0355  NA 141 143 140 139 140  K 3.6 3.3* 3.3* 4.4 3.7  CL 108 105 103 101 101  CO2 22 26 26 29 26   GLUCOSE 72 84 94 99 80  BUN 33* 22 16 14 9   CREATININE 0.75 0.64 0.56 0.54 0.57  CALCIUM 8.1* 8.0* 8.0* 8.0* 8.1*  MG 2.1 1.8 1.8 2.1 1.8  PHOS 2.8 3.2 3.1 3.1 2.9    GFR: Estimated Creatinine Clearance: 61 mL/min (by C-G formula based on SCr of 0.57 mg/dL).  Liver Function Tests: Recent Labs  Lab 10/01/20 0347 10/02/20 0249 10/03/20 0421 10/04/20 0233 10/05/20 0355  AST 57* 31 27 22 23   ALT 48* 33 25 19 18   ALKPHOS 100 114 97 85 100  BILITOT 0.9 0.9 1.2 1.0 1.3*  PROT 5.4* 5.6* 5.6* 5.5* 5.7*  ALBUMIN 1.9* 2.0* 2.0* 1.9* 2.0*    CBG: No results for input(s): GLUCAP in the last 168 hours.   Recent Results (from the past 240 hour(s))  Resp Panel by RT-PCR (Flu Emil Weigold&B, Covid) Nasopharyngeal Swab     Status: None   Collection Time: 09/28/20  9:54 AM   Specimen: Nasopharyngeal Swab; Nasopharyngeal(NP) swabs in vial transport medium  Result Value Ref Range Status   SARS Coronavirus 2 by RT PCR NEGATIVE NEGATIVE Final    Comment: (NOTE) SARS-CoV-2 target nucleic acids are NOT DETECTED.  The  SARS-CoV-2 RNA is generally detectable in upper respiratory specimens during the acute phase of infection. The lowest concentration of SARS-CoV-2 viral copies this assay can detect is 138 copies/mL. Edwinna Rochette negative result does not preclude SARS-Cov-2 infection and should not be used as the sole basis for treatment or other patient management decisions. Kaleem Sartwell negative result may occur with  improper specimen collection/handling, submission of specimen other than nasopharyngeal swab, presence of viral mutation(s) within the areas targeted by this assay, and inadequate number of viral copies(<138 copies/mL). Varian Innes negative result must be combined with clinical observations, patient history, and epidemiological information. The expected result is Negative.  Fact Sheet for Patients:  EntrepreneurPulse.com.au  Fact Sheet for Healthcare Providers:  IncredibleEmployment.be  This test is no t yet approved or cleared by the [place] FDA and  has been authorized for detection and/or diagnosis of SARS-CoV-2 by FDA under an Emergency Use Authorization (EUA). This EUA will remain  in effect (meaning this test can be used) for the duration of the COVID-19 declaration under Section 564(b)(1) of the Act, 21 U.S.C.section 360bbb-3(b)(1), unless the authorization is terminated  or revoked sooner.       Influenza Shiquan Mathieu by PCR NEGATIVE NEGATIVE Final   Influenza B by PCR NEGATIVE NEGATIVE Final    Comment: (NOTE) The Xpert Xpress SARS-CoV-2/FLU/RSV plus assay is intended as an aid in the diagnosis of influenza from Nasopharyngeal swab specimens and should not be used as Akasia Ahmad sole basis for treatment. Nasal washings and aspirates are unacceptable for Xpert Xpress SARS-CoV-2/FLU/RSV testing.  Fact Sheet for Patients: EntrepreneurPulse.com.au  Fact Sheet for Healthcare Providers: IncredibleEmployment.be  This test is not yet approved or  cleared by the [place] FDA and has been authorized for detection and/or  diagnosis of SARS-CoV-2 by FDA under an Emergency Use Authorization (EUA). This EUA will remain in effect (meaning this test can be used) for the duration of the COVID-19 declaration under Section 564(b)(1) of the Act, 21 U.S.C. section 360bbb-3(b)(1), unless the authorization is terminated or revoked.  Performed at [facility], [place] [address], [place], [place] [address]   MRSA PCR Screening     Status: None   Collection Time: 09/28/20  8:15 PM   Specimen: Nasopharyngeal Wash  Result Value Ref Range Status   MRSA by PCR NEGATIVE NEGATIVE Final    Comment:        The GeneXpert MRSA Assay (FDA approved for NASAL specimens only), is one component of Loeta Herst comprehensive MRSA colonization surveillance program. It is not intended to diagnose MRSA infection nor to guide or monitor treatment for MRSA infections. Performed at [facility], [place] [address] [place], [place] [address]          Radiology Studies: CT ABDOMEN PELVIS W CONTRAST  Result Date: 10/04/2020 CLINICAL DATA:  Status post appendectomy on 09/28/2020. Abdominal infection suspected. EXAM: CT ABDOMEN AND PELVIS WITH CONTRAST TECHNIQUE: Multidetector CT imaging of the abdomen and pelvis was performed using the standard protocol following bolus administration of intravenous contrast. CONTRAST:  129mL OMNIPAQUE IOHEXOL 300 MG/ML  SOLN COMPARISON:  09/28/2020 FINDINGS: Lower chest: Moderate bilateral pleural effusions with associated dependent compressive atelectasis. Hepatobiliary: Small irregular area of hypoattenuation along the inferior margin of the right liver lobe, not evident on the prior exam. This is suspicious for Tatiyana Foucher small early abscess. It measures approximately 1.4 cm in greatest dimension. No other liver abnormality. There are dependent gallstones. There is gallbladder wall thickening and/or  pericholecystic fluid, presumed reactive to the adjacent inflammatory changes. No bile duct dilation. Pancreas: No mass. Normal enhancement. There is adjacent fluid attenuation, but no convincing pancreatitis. Spleen: Normal in size without focal abnormality. Adrenals/Urinary Tract: No adrenal masses. Kidneys normal in size, orientation and position with symmetric enhancement and excretion. 2 cm anterior lower pole right renal cyst. Adjacent tiny lesion has also likely Hayslee Casebolt cyst. No other renal masses, no stones and no hydronephrosis. Normal ureters. Bladder minimally distended otherwise unremarkable. Stomach/Bowel/Peritoneal cavity: Multiple peritoneal collections. Largest collection is in the left quadrant left mid abdomen, causing Ashaunti Treptow concave indentation along the greater curvature of the stomach. This collection measures 18 x 6.4 x 7.7 cm. There are few non dependent bubbles of air along its superior margin. Another collection lies abutting the posteromedial liver, which may be intrahepatic along the base of the caudate lobe, measuring 4.4 x 4.5 x 4.4 cm. Aaliyah Cancro third collection lies along the right pericolic gutter measuring approximately 7.2 x 3.1 x 3.8 cm. Choya Tornow small amount of subcapsular fluid extends along the inferior margin of the liver. Trace free fluid is noted in the pelvis. There are additional areas hazy and reticular opacity consistent inflammation/edema. Stomach is mostly decompressed, compressed by the left upper quadrant collection, otherwise unremarkable. Small bowel and colon are normal in caliber. Mild wall thickening noted of the ileum, presumed reactive to adjacent retrocecal inflammation. There are numerous colonic diverticula, most evident along the sigmoid, without evidence of diverticulitis. No free intraperitoneal air or retroperitoneal air. Vascular/Lymphatic: Dense aortic atherosclerosis. No aneurysm. No venous thrombosis. Enlarged left inguinal lymph node, 1.7 cm in short axis. No other enlarged  lymph nodes. Reproductive: Postmenopausal uterus with associated calcifications consistent small fibroids. No adnexal masses. Other: Deep subcutaneous soft tissue air no along the  right anterolateral abdomen. Trocar insertion sites evident in the left lower quadrant and left mid abdomen. Subcutaneous edema is noted along the lower abdomen and pelvis laterally. Musculoskeletal: No fracture or acute finding.  No bone lesion. IMPRESSION: 1. Interval worsening of peritoneal fluid collections consistent with abscesses. Largest collection lies the left upper quadrant, abutting the greater curvature of the stomach, significantly increased in size from the prior CT, now 18 cm in long axis. There is another collection that lies adjacent to, possibly extending into, the posteromedial liver adjacent to the caudate lobe, 4.5 cm in greatest dimension, also increased. Right pericolic gutter collection is also mildly increased in size, but is better defined than on the prior exam. 2. No free intraperitoneal air. 3. There are changes consistent with the interval laparoscopic appendectomy perform since the prior CT. Changes include deep subcutaneous soft tissue air. Electronically Signed   By: Lajean Manes M.D.   On: 10/04/2020 16:42        Scheduled Meds: . acetaminophen  1,000 mg Oral TID  . aspirin  81 mg Oral Daily  . Chlorhexidine Gluconate Cloth  6 each Topical Daily  . enoxaparin (LOVENOX) injection  40 mg Subcutaneous Q24H  . gabapentin  300 mg Oral QHS  . lip balm  1 application Topical BID  . mouth rinse  15 mL Mouth Rinse BID  . mirabegron ER  50 mg Oral Daily  . multivitamin with minerals  1 tablet Oral Daily  . pantoprazole (PROTONIX) IV  40 mg Intravenous QHS  . potassium chloride  40 mEq Oral Daily  . psyllium  1 packet Oral BID  . rosuvastatin  10 mg Oral Daily  . sodium chloride flush  3 mL Intravenous Q12H   Continuous Infusions: . sodium chloride Stopped (10/04/20 0513)  . sodium chloride     . anidulafungin 100 mg (10/04/20 0912)  . piperacillin-tazobactam (ZOSYN)  IV 3.375 g (10/05/20 0604)     LOS: 7 days    Time spent: over 18 min    Fayrene Helper, MD [facility]   To contact the attending provider between 7A-7P or the covering provider during after hours 7P-7A, please log into the web site [email or web address] and access using universal [place] password for that web site. If you do not have the password, please call the hospital operator.  10/05/2020, 8:28 AM

## 2020-10-05 NOTE — Plan of Care (Signed)

## 2020-10-05 NOTE — Progress Notes (Signed)
Writer spoke with Pts daughter and updated.  

## 2020-10-05 NOTE — Procedures (Signed)
Interventional Radiology Procedure Note  Procedure: Image guided drain placement, LUQ.  3F pigtail drain.  Complications: None  EBL: None Sample: Culture sent  Recommendations: - Routine drain care, with sterile flushes, record output - follow up Cx - routine wound care  Signed,  Dulcy Fanny. Earleen Newport, DO

## 2020-10-05 NOTE — Progress Notes (Signed)
Patient ID: Molly Manning, female   DOB: 1941/10/05, 79 y.o.   MRN: [id number] [facility] Surgery Progress Note:   7 Days Post-Op  Subjective: Mental status is clear.  Complaints some pain.  Waiting for percutaneous drain. Objective: Vital signs in last 24 hours: Temp:  [97.8 F (36.6 C)-97.9 F (36.6 C)] 97.8 F (36.6 C) (11/29 2205) Pulse Rate:  [88-97] 88 (11/29 2205) Resp:  [20] 20 (11/29 2205) BP: (169-178)/(78) 169/78 (11/29 2205) SpO2:  [97 %-98 %] 98 % (11/29 2205) Weight:  [77.8 kg] 77.8 kg (11/30 0621)  Intake/Output from previous day: 11/29 0701 - 11/30 0700 In: 1370.2 [P.O.:960; I.V.:305.8; IV Piggyback:104.4] Out: -  Intake/Output this shift: No intake/output data recorded.  Physical Exam: Work of breathing is not labored  Lab Results:  Results for orders placed or performed during the hospital encounter of 09/28/20 (from the past 48 hour(s))  CBC with Differential/Platelet     Status: Abnormal   Collection Time: 10/04/20  2:33 AM  Result Value Ref Range   WBC 20.3 (H) 4.0 - 10.5 K/uL   RBC 3.32 (L) 3.87 - 5.11 MIL/uL   Hemoglobin 10.1 (L) 12.0 - 15.0 g/dL   HCT 31.8 (L) 36 - 46 %   MCV 95.8 80.0 - 100.0 fL   MCH 30.4 26.0 - 34.0 pg   MCHC 31.8 30.0 - 36.0 g/dL   RDW 14.1 11.5 - 15.5 %   Platelets 366 150 - 400 K/uL   nRBC 0.0 0.0 - 0.2 %   Neutrophils Relative % 80 %   Neutro Abs 16.2 (H) 1.7 - 7.7 K/uL   Lymphocytes Relative 7 %   Lymphs Abs 1.5 0.7 - 4.0 K/uL   Monocytes Relative 4 %   Monocytes Absolute 0.8 0.1 - 1.0 K/uL   Eosinophils Relative 2 %   Eosinophils Absolute 0.5 0.0 - 0.5 K/uL   Basophils Relative 1 %   Basophils Absolute 0.1 0.0 - 0.1 K/uL   WBC Morphology MILD LEFT SHIFT (1-5% METAS, OCC MYELO, OCC BANDS)     Comment: TOXIC GRANULATION   Immature Granulocytes 6 %   Abs Immature Granulocytes 1.17 (H) 0.00 - 0.07 K/uL    Comment: Performed at [facility], [place] [address] [place], [place] [address]   Comprehensive metabolic panel     Status: Abnormal   Collection Time: 10/04/20  2:33 AM  Result Value Ref Range   Sodium 139 135 - 145 mmol/L   Potassium 4.4 3.5 - 5.1 mmol/L    Comment: DELTA CHECK NOTED DELTA CHECK NOTED CRIS WILLIAMS RN 10/04/2020 @0422  BY P.HENDERSON    Chloride 101 98 - 111 mmol/L   CO2 29 22 - 32 mmol/L   Glucose, Bld 99 70 - 99 mg/dL    Comment: Glucose reference range applies only to samples taken after fasting for at least 8 hours.   BUN 14 8 - 23 mg/dL   Creatinine, Ser 0.54 0.44 - 1.00 mg/dL   Calcium 8.0 (L) 8.9 - 10.3 mg/dL   Total Protein 5.5 (L) 6.5 - 8.1 g/dL   Albumin 1.9 (L) 3.5 - 5.0 g/dL   AST 22 15 - 41 U/L   ALT 19 0 - 44 U/L   Alkaline Phosphatase 85 38 - 126 U/L   Total Bilirubin 1.0 0.3 - 1.2 mg/dL   GFR, Estimated >60 >60 mL/min    Comment: (NOTE) Calculated using the CKD-EPI Creatinine Equation (2021)    Anion gap 9 5 - 15  Comment: Performed at [facility], [place] [address], [place], [place] [address]  Magnesium     Status: None   Collection Time: 10/04/20  2:33 AM  Result Value Ref Range   Magnesium 2.1 1.7 - 2.4 mg/dL    Comment: Performed at [facility], [place] [address] [place], [place] [address]  Phosphorus     Status: None   Collection Time: 10/04/20  2:33 AM  Result Value Ref Range   Phosphorus 3.1 2.5 - 4.6 mg/dL    Comment: Performed at [facility], [place] [address], [place], [place] [address]  CBC with Differential/Platelet     Status: Abnormal   Collection Time: 10/05/20  3:55 AM  Result Value Ref Range   WBC 16.7 (H) 4.0 - 10.5 K/uL   RBC 3.39 (L) 3.87 - 5.11 MIL/uL   Hemoglobin 10.4 (L) 12.0 - 15.0 g/dL   HCT 32.1 (L) 36 - 46 %   MCV 94.7 80.0 - 100.0 fL   MCH 30.7 26.0 - 34.0 pg   MCHC 32.4 30.0 - 36.0 g/dL   RDW 14.0 11.5 - 15.5 %   Platelets 428 (H) 150 - 400 K/uL   nRBC 0.0 0.0 - 0.2 %   Neutrophils Relative % 83 %   Neutro Abs 13.8 (H) 1.7 - 7.7  K/uL   Lymphocytes Relative 7 %   Lymphs Abs 1.1 0.7 - 4.0 K/uL   Monocytes Relative 4 %   Monocytes Absolute 0.7 0.1 - 1.0 K/uL   Eosinophils Relative 1 %   Eosinophils Absolute 0.2 0.0 - 0.5 K/uL   Basophils Relative 0 %   Basophils Absolute 0.1 0.0 - 0.1 K/uL   Immature Granulocytes 5 %   Abs Immature Granulocytes 0.80 (H) 0.00 - 0.07 K/uL    Comment: Performed at [facility], [place] [address] [place], [place] [address]  Comprehensive metabolic panel     Status: Abnormal   Collection Time: 10/05/20  3:55 AM  Result Value Ref Range   Sodium 140 135 - 145 mmol/L   Potassium 3.7 3.5 - 5.1 mmol/L   Chloride 101 98 - 111 mmol/L   CO2 26 22 - 32 mmol/L   Glucose, Bld 80 70 - 99 mg/dL    Comment: Glucose reference range applies only to samples taken after fasting for at least 8 hours.   BUN 9 8 - 23 mg/dL   Creatinine, Ser 0.57 0.44 - 1.00 mg/dL   Calcium 8.1 (L) 8.9 - 10.3 mg/dL   Total Protein 5.7 (L) 6.5 - 8.1 g/dL   Albumin 2.0 (L) 3.5 - 5.0 g/dL   AST 23 15 - 41 U/L   ALT 18 0 - 44 U/L   Alkaline Phosphatase 100 38 - 126 U/L   Total Bilirubin 1.3 (H) 0.3 - 1.2 mg/dL   GFR, Estimated >60 >60 mL/min    Comment: (NOTE) Calculated using the CKD-EPI Creatinine Equation (2021)    Anion gap 13 5 - 15    Comment: Performed at [facility], [place] [address] [place], [place] [address]  Magnesium     Status: None   Collection Time: 10/05/20  3:55 AM  Result Value Ref Range   Magnesium 1.8 1.7 - 2.4 mg/dL    Comment: Performed at [facility], [place] [address] [place], [place] [address]  Phosphorus     Status: None   Collection Time: 10/05/20  3:55 AM  Result Value Ref  Range   Phosphorus 2.9 2.5 - 4.6 mg/dL    Comment: Performed at [facility], [place] [address], [place], [place] [address]    Radiology/Results: CT ABDOMEN PELVIS W CONTRAST  Result Date: 10/04/2020 CLINICAL DATA:  Status post appendectomy  on 09/28/2020. Abdominal infection suspected. EXAM: CT ABDOMEN AND PELVIS WITH CONTRAST TECHNIQUE: Multidetector CT imaging of the abdomen and pelvis was performed using the standard protocol following bolus administration of intravenous contrast. CONTRAST:  171mL OMNIPAQUE IOHEXOL 300 MG/ML  SOLN COMPARISON:  09/28/2020 FINDINGS: Lower chest: Moderate bilateral pleural effusions with associated dependent compressive atelectasis. Hepatobiliary: Small irregular area of hypoattenuation along the inferior margin of the right liver lobe, not evident on the prior exam. This is suspicious for a small early abscess. It measures approximately 1.4 cm in greatest dimension. No other liver abnormality. There are dependent gallstones. There is gallbladder wall thickening and/or pericholecystic fluid, presumed reactive to the adjacent inflammatory changes. No bile duct dilation. Pancreas: No mass. Normal enhancement. There is adjacent fluid attenuation, but no convincing pancreatitis. Spleen: Normal in size without focal abnormality. Adrenals/Urinary Tract: No adrenal masses. Kidneys normal in size, orientation and position with symmetric enhancement and excretion. 2 cm anterior lower pole right renal cyst. Adjacent tiny lesion has also likely a cyst. No other renal masses, no stones and no hydronephrosis. Normal ureters. Bladder minimally distended otherwise unremarkable. Stomach/Bowel/Peritoneal cavity: Multiple peritoneal collections. Largest collection is in the left quadrant left mid abdomen, causing a concave indentation along the greater curvature of the stomach. This collection measures 18 x 6.4 x 7.7 cm. There are few non dependent bubbles of air along its superior margin. Another collection lies abutting the posteromedial liver, which may be intrahepatic along the base of the caudate lobe, measuring 4.4 x 4.5 x 4.4 cm. A third collection lies along the right pericolic gutter measuring approximately 7.2 x 3.1 x 3.8 cm.  A small amount of subcapsular fluid extends along the inferior margin of the liver. Trace free fluid is noted in the pelvis. There are additional areas hazy and reticular opacity consistent inflammation/edema. Stomach is mostly decompressed, compressed by the left upper quadrant collection, otherwise unremarkable. Small bowel and colon are normal in caliber. Mild wall thickening noted of the ileum, presumed reactive to adjacent retrocecal inflammation. There are numerous colonic diverticula, most evident along the sigmoid, without evidence of diverticulitis. No free intraperitoneal air or retroperitoneal air. Vascular/Lymphatic: Dense aortic atherosclerosis. No aneurysm. No venous thrombosis. Enlarged left inguinal lymph node, 1.7 cm in short axis. No other enlarged lymph nodes. Reproductive: Postmenopausal uterus with associated calcifications consistent small fibroids. No adnexal masses. Other: Deep subcutaneous soft tissue air no along the right anterolateral abdomen. Trocar insertion sites evident in the left lower quadrant and left mid abdomen. Subcutaneous edema is noted along the lower abdomen and pelvis laterally. Musculoskeletal: No fracture or acute finding.  No bone lesion. IMPRESSION: 1. Interval worsening of peritoneal fluid collections consistent with abscesses. Largest collection lies the left upper quadrant, abutting the greater curvature of the stomach, significantly increased in size from the prior CT, now 18 cm in long axis. There is another collection that lies adjacent to, possibly extending into, the posteromedial liver adjacent to the caudate lobe, 4.5 cm in greatest dimension, also increased. Right pericolic gutter collection is also mildly increased in size, but is better defined than on the prior exam. 2. No free intraperitoneal air. 3. There are changes consistent with the interval laparoscopic appendectomy perform since the prior CT.  Changes include deep subcutaneous soft tissue air.  Electronically Signed   By: Lajean Manes M.D.   On: 10/04/2020 16:42    Anti-infectives: Anti-infectives (From admission, onward)   Start     Dose/Rate Route Frequency Ordered Stop   10/02/20 1000  anidulafungin (ERAXIS) 100 mg in sodium chloride 0.9 % 100 mL IVPB        100 mg 78 mL/hr over 100 Minutes Intravenous Every 24 hours 10/01/20 0958 10/08/20 0856   10/01/20 1100  anidulafungin (ERAXIS) 200 mg in sodium chloride 0.9 % 200 mL IVPB        200 mg 78 mL/hr over 200 Minutes Intravenous  Once 10/01/20 1014 10/01/20 1638   09/29/20 2200  piperacillin-tazobactam (ZOSYN) IVPB 3.375 g        3.375 g 12.5 mL/hr over 240 Minutes Intravenous Every 8 hours 09/29/20 1524 10/08/20 0856   09/28/20 2000  piperacillin-tazobactam (ZOSYN) IVPB 3.375 g  Status:  Discontinued        3.375 g 12.5 mL/hr over 240 Minutes Intravenous Every 8 hours 09/28/20 1937 09/29/20 1524   09/28/20 1830  clindamycin (CLEOCIN) 900 mg, gentamicin (GARAMYCIN) 240 mg in sodium chloride 0.9 % 1,000 mL for intraperitoneal lavage         Irrigation To Surgery 09/28/20 1736 09/28/20 1806   09/28/20 1630  clindamycin (CLEOCIN) 900 mg, gentamicin (GARAMYCIN) 240 mg in sodium chloride 0.9 % 1,000 mL for intraperitoneal lavage  Status:  Discontinued       Note to Pharmacy: Have in the  [place] room for final irrigation in bowel surgery case to minimize risk of abscess/infection Pharmacy may adjust dosing strength, schedule, rate of infusion, etc as needed to optimize therapy    Irrigation To Surgery 09/28/20 1618 09/28/20 1649   09/28/20 1600  cefoTEtan (CEFOTAN) 2 g in sodium chloride 0.9 % 100 mL IVPB        2 g 200 mL/hr over 30 Minutes Intravenous On call to O.R. 09/28/20 1320 09/28/20 1605   09/28/20 1145  piperacillin-tazobactam (ZOSYN) IVPB 3.375 g        3.375 g 100 mL/hr over 30 Minutes Intravenous  Once 09/28/20 1144 09/28/20 1353      Assessment/Plan: Problem List: Patient Active Problem List   Diagnosis Date Noted   . Hypokalemia 10/03/2020  . Hypomagnesemia 09/29/2020  . Age-related osteoporosis without current pathological fracture 09/28/2020  . Atherosclerosis of coronary artery without angina pectoris 09/28/2020  . Arteriosclerosis of carotid artery 09/28/2020  . Family history of epilepsy and other diseases of the nervous system 09/28/2020  . Family history of malignant neoplasm of gastrointestinal tract 09/28/2020  . Gallstones 09/28/2020  . Hiatal hernia 09/28/2020  . History of tobacco use 09/28/2020  . Increased frequency of urination 09/28/2020  . Long term (current) use of aspirin 09/28/2020  . Microscopic hematuria 09/28/2020  . Multiple nodules of lung 09/28/2020  . Personal history of colonic polyps 09/28/2020  . Psoriasis 09/28/2020  . Pure hypercholesterolemia 09/28/2020  . Tinnitus 09/28/2020  . Pneumoperitoneum 09/28/2020  . Gangrenous perforated appendicitis with generalized peritonitis & abscesses s/p lap appendectomy & washout 09/28/2020 09/28/2020  . Septic shock ([place]) 09/28/2020  . Acute appendicitis with generalized peritonitis, abscess, and gangrene 09/28/2020    Some tenderness in the right lower quadrant--IR to repeat drain in the right lower quadrant 7 Days Post-Op    LOS: 7 days   Matt B. Hassell Done, MD, [facility] [phone] to reach the surgeon on  call.    10/05/2020 9:38 AM

## 2020-10-05 NOTE — H&P (Signed)
Chief Complaint: Ongoing leukocytosis s/p lap appendectomy and wash out on 11.23.21 found to have an intra abdominal abscess. Request is for abscess drain placement   Referring Physician(s): Sandria Senter PA  Supervising Physician: Corrie Mckusick  Patient Status: [facility] - In-pt  History of Present Illness: Molly Manning is a 79 y.o. female   History reviewed. No pertinent past medical history.  Past Surgical History:  Procedure Laterality Date  . LAPAROSCOPIC APPENDECTOMY N/A 09/28/2020   Procedure: DIAGNOSTIC LAPAROSCOPY;  LAPAROSCOPIC DRAINAGE OF ABSESS X 2 LAPAROSCOPIC APPENDECTOMY;  Surgeon: Michael Boston, MD;  Location: [facility];  Service: General;  Laterality: N/A;    Allergies: Fosamax [alendronate]  Medications: Prior to Admission medications   Medication Sig Start Date End Date Taking? Authorizing Provider  aspirin 81 MG chewable tablet Chew 81 mg by mouth daily.   Yes [provider]  Biotin 1000 MCG tablet Take 1,000 mcg by mouth daily.   Yes [provider]  Calcium Carb-Cholecalciferol (CALCIUM 1000 + D) 1000-800 MG-UNIT TABS Take 1 tablet by mouth daily.   Yes [provider]  Multiple Vitamins-Minerals (CENTRUM SILVER) tablet Take 1 tablet by mouth daily.   Yes [provider]  MYRBETRIQ 50 MG TB24 tablet Take 50 mg by mouth daily. 09/23/20  Yes [provider]  rosuvastatin (CRESTOR) 10 MG tablet Take 10 mg by mouth daily. 08/22/20  Yes [provider]  VITAMIN D PO Take 1 tablet by mouth daily.   Yes [provider]     History reviewed. No pertinent family history.  Social History   Socioeconomic History  . Marital status: Married    Spouse name: Not on file  . Number of children: Not on file  . Years of education: Not on file  . Highest education level: Not on file  Occupational History  . Not on file  Tobacco Use  . Smoking status: Former Smoker    Packs/day: 0.75    Years: 40.00     Pack years: 30.00    Types: Cigarettes    Quit date: 2002    Years since quitting: 19.9  . Smokeless tobacco: Never Used  Vaping Use  . Vaping Use: Never used  Substance and Sexual Activity  . Alcohol use: Not on file  . Drug use: Not on file  . Sexual activity: Not on file  Other Topics Concern  . Not on file  Social History Narrative  . Not on file   Social Determinants of Health   Financial Resource Strain:   . Difficulty of Paying Living Expenses: Not on file  Food Insecurity:   . Worried About Charity fundraiser in the Last Year: Not on file  . Ran Out of Food in the Last Year: Not on file  Transportation Needs:   . Lack of Transportation (Medical): Not on file  . Lack of Transportation (Non-Medical): Not on file  Physical Activity:   . Days of Exercise per Week: Not on file  . Minutes of Exercise per Session: Not on file  Stress:   . Feeling of Stress : Not on file  Social Connections:   . Frequency of Communication with Friends and Family: Not on file  . Frequency of Social Gatherings with Friends and Family: Not on file  . Attends Religious Services: Not on file  . Active Member of Clubs or Organizations: Not on file  . Attends Archivist Meetings: Not on file  . Marital Status: Not on file  Review of Systems: A 12 point ROS discussed and pertinent positives are indicated in the HPI above.  All other systems are negative.  Review of Systems  Constitutional: Negative for fatigue and fever.  HENT: Negative for congestion.   Respiratory: Negative for cough and shortness of breath.   Gastrointestinal: Positive for abdominal pain ( right flank). Negative for diarrhea, nausea and vomiting.    Vital Signs: BP (!) 169/78 (BP Location: Right Arm)   Pulse 88   Temp 97.8 F (36.6 C) (Oral)   Resp 20   Ht 5\' 6"  (1.676 m)   Wt 171 lb 8.3 oz (77.8 kg)   SpO2 98%   BMI 27.68 kg/m   Physical Exam Vitals and nursing note reviewed.  Constitutional:       Appearance: She is well-developed.  HENT:     Head: Normocephalic and atraumatic.  Eyes:     Conjunctiva/sclera: Conjunctivae normal.  Cardiovascular:     Rate and Rhythm: Normal rate and regular rhythm.     Heart sounds: Normal heart sounds.  Pulmonary:     Effort: Pulmonary effort is normal.  Musculoskeletal:        General: Normal range of motion.     Cervical back: Normal range of motion.  Skin:    General: Skin is warm.  Neurological:     Mental Status: She is alert and oriented to person, place, and time.     Imaging: CT ABDOMEN PELVIS W CONTRAST  Result Date: 10/04/2020 CLINICAL DATA:  Status post appendectomy on 09/28/2020. Abdominal infection suspected. EXAM: CT ABDOMEN AND PELVIS WITH CONTRAST TECHNIQUE: Multidetector CT imaging of the abdomen and pelvis was performed using the standard protocol following bolus administration of intravenous contrast. CONTRAST:  167mL OMNIPAQUE IOHEXOL 300 MG/ML  SOLN COMPARISON:  09/28/2020 FINDINGS: Lower chest: Moderate bilateral pleural effusions with associated dependent compressive atelectasis. Hepatobiliary: Small irregular area of hypoattenuation along the inferior margin of the right liver lobe, not evident on the prior exam. This is suspicious for a small early abscess. It measures approximately 1.4 cm in greatest dimension. No other liver abnormality. There are dependent gallstones. There is gallbladder wall thickening and/or pericholecystic fluid, presumed reactive to the adjacent inflammatory changes. No bile duct dilation. Pancreas: No mass. Normal enhancement. There is adjacent fluid attenuation, but no convincing pancreatitis. Spleen: Normal in size without focal abnormality. Adrenals/Urinary Tract: No adrenal masses. Kidneys normal in size, orientation and position with symmetric enhancement and excretion. 2 cm anterior lower pole right renal cyst. Adjacent tiny lesion has also likely a cyst. No other renal masses, no stones and  no hydronephrosis. Normal ureters. Bladder minimally distended otherwise unremarkable. Stomach/Bowel/Peritoneal cavity: Multiple peritoneal collections. Largest collection is in the left quadrant left mid abdomen, causing a concave indentation along the greater curvature of the stomach. This collection measures 18 x 6.4 x 7.7 cm. There are few non dependent bubbles of air along its superior margin. Another collection lies abutting the posteromedial liver, which may be intrahepatic along the base of the caudate lobe, measuring 4.4 x 4.5 x 4.4 cm. A third collection lies along the right pericolic gutter measuring approximately 7.2 x 3.1 x 3.8 cm. A small amount of subcapsular fluid extends along the inferior margin of the liver. Trace free fluid is noted in the pelvis. There are additional areas hazy and reticular opacity consistent inflammation/edema. Stomach is mostly decompressed, compressed by the left upper quadrant collection, otherwise unremarkable. Small bowel and colon are normal in caliber. Mild wall thickening  noted of the ileum, presumed reactive to adjacent retrocecal inflammation. There are numerous colonic diverticula, most evident along the sigmoid, without evidence of diverticulitis. No free intraperitoneal air or retroperitoneal air. Vascular/Lymphatic: Dense aortic atherosclerosis. No aneurysm. No venous thrombosis. Enlarged left inguinal lymph node, 1.7 cm in short axis. No other enlarged lymph nodes. Reproductive: Postmenopausal uterus with associated calcifications consistent small fibroids. No adnexal masses. Other: Deep subcutaneous soft tissue air no along the right anterolateral abdomen. Trocar insertion sites evident in the left lower quadrant and left mid abdomen. Subcutaneous edema is noted along the lower abdomen and pelvis laterally. Musculoskeletal: No fracture or acute finding.  No bone lesion. IMPRESSION: 1. Interval worsening of peritoneal fluid collections consistent with abscesses.  Largest collection lies the left upper quadrant, abutting the greater curvature of the stomach, significantly increased in size from the prior CT, now 18 cm in long axis. There is another collection that lies adjacent to, possibly extending into, the posteromedial liver adjacent to the caudate lobe, 4.5 cm in greatest dimension, also increased. Right pericolic gutter collection is also mildly increased in size, but is better defined than on the prior exam. 2. No free intraperitoneal air. 3. There are changes consistent with the interval laparoscopic appendectomy perform since the prior CT. Changes include deep subcutaneous soft tissue air. Electronically Signed   By: Lajean Manes M.D.   On: 10/04/2020 16:42   CT Abdomen Pelvis W Contrast  Result Date: 09/28/2020 CLINICAL DATA:  Abdominal distension following colonoscopy several days ago EXAM: CT ABDOMEN AND PELVIS WITH CONTRAST TECHNIQUE: Multidetector CT imaging of the abdomen and pelvis was performed using the standard protocol following bolus administration of intravenous contrast. CONTRAST:  43mL OMNIPAQUE IOHEXOL 300 MG/ML  SOLN COMPARISON:  07/16/2015 FINDINGS: Lower chest: Lung bases again demonstrate a stable right lower lobe nodule posteriorly which has been stable over several previous exams consistent with a benign etiology. Mild left lower lobe atelectatic changes are seen. Hepatobiliary: Dependent density is noted within the gallbladder likely related to a combination of sludge and multiple small stones. The liver is within normal limits. Pancreas: Unremarkable. No pancreatic ductal dilatation or surrounding inflammatory changes. Spleen: Normal in size without focal abnormality. Adrenals/Urinary Tract: Adrenal glands are within normal limits. Kidneys demonstrate a normal enhancement pattern bilaterally. A cyst is noted on the right inferiorly measuring 2.2 cm slightly larger than that seen on prior exam. Normal excretion is noted on delayed  images. No renal calculi are seen. No obstructive changes are noted. The bladder is within normal limits. Stomach/Bowel: Diverticulosis is noted throughout the colon with areas of pericolonic inflammatory change consistent with mild diverticulitis. Fluid and air are noted near the greater curvature of the stomach likely related to this diverticular inflammatory change. The appendix well visualized and demonstrates no evidence of appendicoliths. It is partially air-filled. Surrounding the tip of the appendix, there is a focal air-fluid collection which measures approximately 3.4 x 4.2 cm in greatest dimension. These changes are suspicious for underlying appendiceal perforation with evolving abscess formation. Sliding-type hiatal hernia is noted with approximately 30% of the stomach within the chest. No definitive ulceration of the stomach is noted. The intervening small bowel is within normal limits. Vascular/Lymphatic: Diffuse atherosclerotic calcifications are noted. No significant lymphadenopathy is seen. Reproductive: Uterus demonstrates multiple uterine fibroids with calcification. No definitive adnexal mass is noted. Other: Free fluid is noted in the pelvic cul-de-sac Musculoskeletal: Degenerative changes of lumbar spine are seen. IMPRESSION: Air-fluid collection surrounding the tip of the appendix  suggestive of distal appendiceal perforation and evolving abscess formation in the periappendiceal region. Diverticulosis is noted throughout the colon. There are some pericolonic areas of inflammatory change as well as foci of air consistent with diverticulitis. This is most notable in the mid to distal transverse colon. This likely accounts for the additional air and fluid surrounding the stomach. Stable right lower lobe nodule consistent with a benign etiology. Mild left basilar atelectasis. Cholelithiasis without complicating factors. Electronically Signed   By: Inez Catalina M.D.   On: 09/28/2020 11:23   DG  CHEST PORT 1 VIEW  Result Date: 09/30/2020 CLINICAL DATA:  Evaluate for pneumonia EXAM: PORTABLE CHEST 1 VIEW COMPARISON:  09/28/2020 FINDINGS: There is a nasogastric tube with tip and side port below the level of the GE junction. Heart size is normal. Aortic atherosclerotic calcifications. Small bilateral pleural effusions, unchanged. Decreased aeration of both lung bases compatible with atelectasis and or pneumonia. New bandlike area of atelectasis within the left midlung. IMPRESSION: 1. Persistent small pleural effusions with decreased aeration of both lower lung zones. 2. New bandlike area of atelectasis within the left midlung. Electronically Signed   By: Kerby Moors M.D.   On: 09/30/2020 07:25   DG Chest Portable 1 View  Result Date: 09/28/2020 CLINICAL DATA:  NG placement EXAM: PORTABLE CHEST 1 VIEW COMPARISON:  Earlier same day FINDINGS: New enteric tube passes into the distal stomach with tip looping back to the gastric body. Bowel gas pattern is not well evaluated. Left mid and lower lung opacities again noted. IMPRESSION: New enteric tube passes into the stomach. Electronically Signed   By: Macy Mis M.D.   On: 09/28/2020 15:09   DG Chest Port 1 View  Result Date: 09/28/2020 CLINICAL DATA:  Chest pain EXAM: PORTABLE CHEST 1 VIEW COMPARISON:  05/23/2018 FINDINGS: Mild cardiomegaly. Atherosclerotic calcification of the aortic knob. Patchy airspace opacity within the left mid lung and left lung base. Right lung is clear. Possible trace left pleural effusion. No pneumothorax. IMPRESSION: Patchy airspace opacity within the left mid lung and left lung base suspicious for pneumonia. Electronically Signed   By: Davina Poke D.O.   On: 09/28/2020 09:47   VAS [place] UPPER EXTREMITY VENOUS DUPLEX  Result Date: 10/01/2020 UPPER VENOUS STUDY  Indications: Swelling Anticoagulation: Lovenox. Limitations: Poor ultrasound/tissue interface due to swelling. Comparison Study: No prior studies.  Performing Technologist: Darlin Coco, RDMSLo  Examination Guidelines: A complete evaluation includes B-mode imaging, spectral Doppler, color Doppler, and power Doppler as needed of all accessible portions of each vessel. Bilateral testing is considered an integral part of a complete examination. Limited examinations for reoccurring indications may be performed as noted.  Right Findings: +----------+------------+---------+-----------+----------+---------------------+ RIGHT     CompressiblePhasicitySpontaneousProperties       Summary        +----------+------------+---------+-----------+----------+---------------------+ IJV           Full       Yes       Yes                                    +----------+------------+---------+-----------+----------+---------------------+ Subclavian    Full       Yes       Yes                                    +----------+------------+---------+-----------+----------+---------------------+ Axillary  Full       Yes       Yes                                    +----------+------------+---------+-----------+----------+---------------------+ Brachial      Full                                    Some segments not                                                          well visualized    +----------+------------+---------+-----------+----------+---------------------+ Radial        Full                                                        +----------+------------+---------+-----------+----------+---------------------+ Ulnar         Full                                                        +----------+------------+---------+-----------+----------+---------------------+ Cephalic      Full                                   Unable to assess at                                                       [facility] due to bandages   +----------+------------+---------+-----------+----------+---------------------+ Basilic        Full                                   Unable to assess at                                                       [facility] due to bandages   +----------+------------+---------+-----------+----------+---------------------+  Left Findings: +----------+------------+---------+-----------+----------+-------+ LEFT      CompressiblePhasicitySpontaneousPropertiesSummary +----------+------------+---------+-----------+----------+-------+ Subclavian    Full       Yes       Yes                      +----------+------------+---------+-----------+----------+-------+  Summary:  Right: No evidence of deep vein thrombosis in the upper extremity. No evidence of superficial vein thrombosis in the upper extremity. However, some portions of this examination were limited. See technologist's comments above.  Left: No evidence of thrombosis in  the subclavian.  *See table(s) above for measurements and observations.  Diagnosing physician: Jamelle Haring Electronically signed by Jamelle Haring on 10/01/2020 at 2:56:03 PM.    Final     Labs:  CBC: Recent Labs    10/02/20 0249 10/03/20 0421 10/04/20 0233 10/05/20 0355  WBC 19.0* 17.5* 20.3* 16.7*  HGB 11.0* 10.9* 10.1* 10.4*  HCT 34.0* 32.9* 31.8* 32.1*  PLT 345 369 366 428*    COAGS: No results for input(s): INR, APTT in the last 8760 hours.  BMP: Recent Labs    10/02/20 0249 10/03/20 0421 10/04/20 0233 10/05/20 0355  NA 143 140 139 140  K 3.3* 3.3* 4.4 3.7  CL 105 103 101 101  CO2 26 26 29 26   GLUCOSE 84 94 99 80  BUN 22 16 14 9   CALCIUM 8.0* 8.0* 8.0* 8.1*  CREATININE 0.64 0.56 0.54 0.57  GFRNONAA >60 >60 >60 >60    LIVER FUNCTION TESTS: Recent Labs    10/02/20 0249 10/03/20 0421 10/04/20 0233 10/05/20 0355  BILITOT 0.9 1.2 1.0 1.3*  AST 31 27 22 23   ALT 33 25 19 18   ALKPHOS 114 97 85 100  PROT 5.6* 5.6* 5.5* 5.7*  ALBUMIN 2.0* 2.0* 1.9* 2.0*    Assessment and Plan:  79 y.o female inpatient. History of HLD and  overactive bladder. Patient presented to the ED at [facility] with persistent and worsening abdominal pain with associated nausea and vomiting. Found to be septic with  a gangrenous perforated appendicitis s/p lap appendectomy and wash out on 11.23.21. CT Abdomen performed for ongoing leukocytosis reads Interval worsening of peritoneal fluid collections consistent with abscesses. Largest collection lies the left upper quadrant, abutting the greater curvature of the stomach, significantly increased in size from the prior CT, now 18 cm in long axis. There is another collection that lies adjacent to, possibly extending into, the posteromedial liver adjacent to the caudate lobe, 4.5 cm in greatest dimension, also increased. Right pericolic gutter collection is also mildly increased in size, but is better defined than on the prior exam. Team is requesting an intra- abdominal abscess drain placement.    WBC is 16.7 (down trending) patient is afebrile. Total bilirubin 1.3. Patient is on subcutaneous prophylactic dose of lovenox last dose given at 9:14 on 11.29.21.  IR consulted for possible intra abdominal abscess drain placement. Case has been reviewed and procedure approved by Dr. Earleen Newport.  Patient tentatively scheduled for 11.30.21.  Team instructed to: Keep Patient to be NPO after midnight Hold prophylactic anticoagulation 24 hours prior to scheduled procedure.   IR will call patient when ready.  Risks and benefits discussed with the patient including bleeding, infection, damage to adjacent structures, bowel perforation/fistula connection, and sepsis.  All of the patient's questions were answered, patient is agreeable to proceed. Consent signed and in chart.     Thank you for this interesting consult.  I greatly enjoyed meeting Molly Manning and look forward to participating in their care.  A copy of this report was sent to the requesting provider on this date.  Electronically Signed: Jacqualine Mau, NP 10/05/2020, 9:00 AM   I spent a total of 40 Minutes    in face to face in clinical consultation, greater than 50% of which was counseling/coordinating care for intra abdominal abscess drain placement

## 2020-10-05 NOTE — Progress Notes (Addendum)
7 Days Post-Op    CC: Abdominal pain/free air  Subjective: Patient looks good this a.m. but says she is having more pain.  Primary in the right lower quadrant.  She is currently n.p.o. and awaiting IR evaluation.  Objective: Vital signs in last 24 hours: Temp:  [97.8 F (36.6 C)-97.9 F (36.6 C)] 97.8 F (36.6 C) (11/29 2205) Pulse Rate:  [88-97] 88 (11/29 2205) Resp:  [20] 20 (11/29 2205) BP: (169-178)/(78) 169/78 (11/29 2205) SpO2:  [97 %-98 %] 98 % (11/29 2205) Weight:  [77.8 kg] 77.8 kg (11/30 0621) Last BM Date: 10/04/20 960 p.o. 400 IV Urine x8 Stool x8 Afebrile, vital signs are stable. CMP is essentially normal except for calcium of 8.1, albumin 2.0, total protein 5.7, total bilirubin 1.3 WBC 16.7 H/H 10.4/32.1 Platelets 428,000 CT scan shows multiple peritoneal collections; the largest 18 by 6 x 4 x 7.7 cm  Intake/Output from previous day: 11/29 0701 - 11/30 0700 In: 1370.2 [P.O.:960; I.V.:305.8; IV Piggyback:104.4] Out: -  Intake/Output this shift: No intake/output data recorded.  General appearance: alert, cooperative and no distress Resp: clear to auscultation bilaterally GI: Mildly distended, tender especially in the right lower quadrant.  Bowel sounds are hypoactive.  She was having multiple bowel movements yesterday.  Lab Results:  Recent Labs    10/04/20 0233 10/05/20 0355  WBC 20.3* 16.7*  HGB 10.1* 10.4*  HCT 31.8* 32.1*  PLT 366 428*    BMET Recent Labs    10/04/20 0233 10/05/20 0355  NA 139 140  K 4.4 3.7  CL 101 101  CO2 29 26  GLUCOSE 99 80  BUN 14 9  CREATININE 0.54 0.57  CALCIUM 8.0* 8.1*   PT/INR No results for input(s): LABPROT, INR in the last 72 hours.  Recent Labs  Lab 10/01/20 0347 10/02/20 0249 10/03/20 0421 10/04/20 0233 10/05/20 0355  AST 57* 31 27 22 23   ALT 48* 33 25 19 18   ALKPHOS 100 114 97 85 100  BILITOT 0.9 0.9 1.2 1.0 1.3*  PROT 5.4* 5.6* 5.6* 5.5* 5.7*  ALBUMIN 1.9* 2.0* 2.0* 1.9* 2.0*      Lipase     Component Value Date/Time   LIPASE 25 09/28/2020 0850     Medications: . acetaminophen  1,000 mg Oral TID  . aspirin  81 mg Oral Daily  . Chlorhexidine Gluconate Cloth  6 each Topical Daily  . enoxaparin (LOVENOX) injection  40 mg Subcutaneous Q24H  . gabapentin  300 mg Oral QHS  . lip balm  1 application Topical BID  . mouth rinse  15 mL Mouth Rinse BID  . mirabegron ER  50 mg Oral Daily  . multivitamin with minerals  1 tablet Oral Daily  . pantoprazole (PROTONIX) IV  40 mg Intravenous QHS  . potassium chloride  40 mEq Oral Daily  . psyllium  1 packet Oral BID  . rosuvastatin  10 mg Oral Daily  . sodium chloride flush  3 mL Intravenous Q12H     Assessment/Plan HLD OAB AKI -improved, Cr 0.54 ABL anemia- Hgb 10.1, stable  Severe protein calorie malnutrition - prealbumin <5 (11/24).  Hypomagnesemia - 2.1 this AM  Abdominal pain/free air Gangrenous perforated appendicitis, intra-abdominal and retroperitoneal abscesses, feculent peritonitis, -S/pLAPAROSCOPIC DRAINAGE OF ABSESS X 2,LAPAROSCOPIC APPENDECTOMY,DIAGNOSTIC LAPAROSCOPYWITH ABDOMINAL WASHOUT11/23 Dr. Johney Maine - POD#7 - surgical path pending - - both JP were removed yesterday  - WBC remains elevated at 20 - check CT A/P today, continue IV abx - NPO until CT back  -  continue to mobilize as tolerated - CT 11/29: Interval worsening of peritoneal fluid collections consistent with abscess.  The largest collection lies in the left upper quadrant abutting the greater curvature the stomach, significantly increased in size from the prior CT.  Now 18 cm long axis there is another collection adjacent to, possibly extending into the posterior medial liver adjacent to the caudate lobe.  4.5 cm in the greatest dimension also increased.  Right paracolic gutter collections also mildly increased in size but is better defined than on the prior exam.  No free intraperitoneal air.  -IR consult pending  ID  [id number]>> day 7 VTE -SCDs, lovenox  FEN -IVF, NPO Foley -removed 11/24 Follow up -Dr. Johney Maine   Plan: Continue antibiotics, IR drain placement pending for later today.  Will recheck labs in AM. 12 Fr drain placed by IR.      LOS: 7 days    Amal Saiki 10/05/2020 Please see Amion

## 2020-10-05 NOTE — Plan of Care (Signed)
Plan of care reviewed and discussed with the patient. 

## 2020-10-06 DIAGNOSIS — K3521 Acute appendicitis with generalized peritonitis, with abscess: Secondary | ICD-10-CM | POA: Diagnosis not present

## 2020-10-06 DIAGNOSIS — R030 Elevated blood-pressure reading, without diagnosis of hypertension: Secondary | ICD-10-CM | POA: Diagnosis not present

## 2020-10-06 DIAGNOSIS — D75839 Thrombocytosis, unspecified: Secondary | ICD-10-CM | POA: Diagnosis not present

## 2020-10-06 DIAGNOSIS — D72829 Elevated white blood cell count, unspecified: Secondary | ICD-10-CM | POA: Diagnosis not present

## 2020-10-06 LAB — CBC
HCT: 32.4 % — ABNORMAL LOW (ref 36.0–46.0)
Hemoglobin: 10.3 g/dL — ABNORMAL LOW (ref 12.0–15.0)
MCH: 30.7 pg (ref 26.0–34.0)
MCHC: 31.8 g/dL (ref 30.0–36.0)
MCV: 96.7 fL (ref 80.0–100.0)
Platelets: 474 10*3/uL — ABNORMAL HIGH (ref 150–400)
RBC: 3.35 MIL/uL — ABNORMAL LOW (ref 3.87–5.11)
RDW: 14.2 % (ref 11.5–15.5)
WBC: 13.3 10*3/uL — ABNORMAL HIGH (ref 4.0–10.5)
nRBC: 0 % (ref 0.0–0.2)

## 2020-10-06 LAB — BASIC METABOLIC PANEL
Anion gap: 14 (ref 5–15)
BUN: 7 mg/dL — ABNORMAL LOW (ref 8–23)
CO2: 23 mmol/L (ref 22–32)
Calcium: 8 mg/dL — ABNORMAL LOW (ref 8.9–10.3)
Chloride: 101 mmol/L (ref 98–111)
Creatinine, Ser: 0.52 mg/dL (ref 0.44–1.00)
GFR, Estimated: >60 mL/min (ref >60)
Glucose, Bld: 89 mg/dL (ref 70–99)
Potassium: 3.7 mmol/L (ref 3.5–5.1)
Sodium: 138 mmol/L (ref 135–145)

## 2020-10-06 LAB — PREALBUMIN: Prealbumin: 7.2 mg/dL — ABNORMAL LOW (ref 18–38)

## 2020-10-06 MED ORDER — AMLODIPINE BESYLATE 5 MG PO TABS
5.0000 mg | ORAL_TABLET | Freq: Every day | ORAL | Status: DC
  Administered 2020-10-06 – 2020-10-12 (×7): 5 mg via ORAL
  Filled 2020-10-06 (×7): qty 1

## 2020-10-06 MED ORDER — BOOST / RESOURCE BREEZE PO LIQD CUSTOM
1.0000 | Freq: Three times a day (TID) | ORAL | Status: DC
  Administered 2020-10-06 – 2020-10-12 (×11): 1 via ORAL

## 2020-10-06 NOTE — Progress Notes (Signed)
Referring Physician(s): Martin,M  Supervising Physician: Suttle,D  Patient Status:  [facility] - In-pt  Chief Complaint:  Abdominal pain/left upper abdominal fluid collection  Subjective: Patient states she feels better since abdominal drain placed yesterday; denies nausea or vomiting   Allergies: Fosamax [alendronate]  Medications: Prior to Admission medications   Medication Sig Start Date End Date Taking? Authorizing Provider  aspirin 81 MG chewable tablet Chew 81 mg by mouth daily.   Yes [provider]  Biotin 1000 MCG tablet Take 1,000 mcg by mouth daily.   Yes [provider]  Calcium Carb-Cholecalciferol (CALCIUM 1000 + D) 1000-800 MG-UNIT TABS Take 1 tablet by mouth daily.   Yes [provider]  Multiple Vitamins-Minerals (CENTRUM SILVER) tablet Take 1 tablet by mouth daily.   Yes [provider]  MYRBETRIQ 50 MG TB24 tablet Take 50 mg by mouth daily. 09/23/20  Yes [provider]  rosuvastatin (CRESTOR) 10 MG tablet Take 10 mg by mouth daily. 08/22/20  Yes [provider]  VITAMIN D PO Take 1 tablet by mouth daily.   Yes [provider]     Vital Signs: BP (!) 163/74 (BP Location: Right Arm)   Pulse 90   Temp 98.2 F (36.8 C) (Oral)   Resp 18   Ht 5\' 6"  (1.676 m)   Wt 158 lb 4.6 oz (71.8 kg)   SpO2 95%   BMI 25.55 kg/m   Physical Exam awake, alert.  Left abdominal drain in place, insertion site okay, minimal tenderness to palpation, output 400 cc yesterday, left 5 to 10 cc this morning turbid beige-colored fluid; drain irrigated without difficulty  Imaging: CT ABDOMEN PELVIS W CONTRAST  Result Date: 10/04/2020 CLINICAL DATA:  Status post appendectomy on 09/28/2020. Abdominal infection suspected. EXAM: CT ABDOMEN AND PELVIS WITH CONTRAST TECHNIQUE: Multidetector CT imaging of the abdomen and pelvis was performed using the standard protocol following bolus administration of intravenous contrast.  CONTRAST:  154mL OMNIPAQUE IOHEXOL 300 MG/ML  SOLN COMPARISON:  09/28/2020 FINDINGS: Lower chest: Moderate bilateral pleural effusions with associated dependent compressive atelectasis. Hepatobiliary: Small irregular area of hypoattenuation along the inferior margin of the right liver lobe, not evident on the prior exam. This is suspicious for a small early abscess. It measures approximately 1.4 cm in greatest dimension. No other liver abnormality. There are dependent gallstones. There is gallbladder wall thickening and/or pericholecystic fluid, presumed reactive to the adjacent inflammatory changes. No bile duct dilation. Pancreas: No mass. Normal enhancement. There is adjacent fluid attenuation, but no convincing pancreatitis. Spleen: Normal in size without focal abnormality. Adrenals/Urinary Tract: No adrenal masses. Kidneys normal in size, orientation and position with symmetric enhancement and excretion. 2 cm anterior lower pole right renal cyst. Adjacent tiny lesion has also likely a cyst. No other renal masses, no stones and no hydronephrosis. Normal ureters. Bladder minimally distended otherwise unremarkable. Stomach/Bowel/Peritoneal cavity: Multiple peritoneal collections. Largest collection is in the left quadrant left mid abdomen, causing a concave indentation along the greater curvature of the stomach. This collection measures 18 x 6.4 x 7.7 cm. There are few non dependent bubbles of air along its superior margin. Another collection lies abutting the posteromedial liver, which may be intrahepatic along the base of the caudate lobe, measuring 4.4 x 4.5 x 4.4 cm. A third collection lies along the right pericolic gutter measuring approximately 7.2 x 3.1 x 3.8 cm. A small amount of subcapsular fluid extends along the inferior margin of the liver. Trace free fluid is noted in  the pelvis. There are additional areas hazy and reticular opacity consistent inflammation/edema. Stomach is mostly decompressed,  compressed by the left upper quadrant collection, otherwise unremarkable. Small bowel and colon are normal in caliber. Mild wall thickening noted of the ileum, presumed reactive to adjacent retrocecal inflammation. There are numerous colonic diverticula, most evident along the sigmoid, without evidence of diverticulitis. No free intraperitoneal air or retroperitoneal air. Vascular/Lymphatic: Dense aortic atherosclerosis. No aneurysm. No venous thrombosis. Enlarged left inguinal lymph node, 1.7 cm in short axis. No other enlarged lymph nodes. Reproductive: Postmenopausal uterus with associated calcifications consistent small fibroids. No adnexal masses. Other: Deep subcutaneous soft tissue air no along the right anterolateral abdomen. Trocar insertion sites evident in the left lower quadrant and left mid abdomen. Subcutaneous edema is noted along the lower abdomen and pelvis laterally. Musculoskeletal: No fracture or acute finding.  No bone lesion. IMPRESSION: 1. Interval worsening of peritoneal fluid collections consistent with abscesses. Largest collection lies the left upper quadrant, abutting the greater curvature of the stomach, significantly increased in size from the prior CT, now 18 cm in long axis. There is another collection that lies adjacent to, possibly extending into, the posteromedial liver adjacent to the caudate lobe, 4.5 cm in greatest dimension, also increased. Right pericolic gutter collection is also mildly increased in size, but is better defined than on the prior exam. 2. No free intraperitoneal air. 3. There are changes consistent with the interval laparoscopic appendectomy perform since the prior CT. Changes include deep subcutaneous soft tissue air. Electronically Signed   By: Lajean Manes M.D.   On: 10/04/2020 16:42   CT IMAGE GUIDED DRAINAGE BY PERCUTANEOUS CATHETER  Result Date: 10/05/2020 INDICATION: 79 year old female with left upper quadrant fluid collection referred for drainage  EXAM: CT GUIDED DRAINAGE OF  ABSCESS MEDICATIONS: The patient is currently admitted to the hospital and receiving intravenous antibiotics. The antibiotics were administered within an appropriate time frame prior to the initiation of the procedure. ANESTHESIA/SEDATION: 2.0 mg IV Versed 100 mcg IV Fentanyl Moderate Sedation Time:  16 minutes The patient was continuously monitored during the procedure by the interventional radiology nurse under my direct supervision. COMPLICATIONS: None TECHNIQUE: Informed written consent was obtained from the patient after a thorough discussion of the procedural risks, benefits and alternatives. All questions were addressed. Maximal Sterile Barrier Technique was utilized including caps, mask, sterile gowns, sterile gloves, sterile drape, hand hygiene and skin antiseptic. A timeout was performed prior to the initiation of the procedure. PROCEDURE: The operative field was prepped with Chlorhexidine in a sterile fashion, and a sterile drape was applied covering the operative field. A sterile gown and sterile gloves were used for the procedure. Local anesthesia was provided with 1% Lidocaine. Scout CT was acquired. The patient is prepped and draped in the usual sterile fashion. 1% lidocaine was used for local anesthesia. Trocar needle was advanced into the fluid collection of the left abdomen. Once we confirmed needle tip position, wire was advanced and then modified Seldinger technique was used to place a 12 [place] drain into the fluid collection. Approximately 280 cc of murky yellow fluid was aspirated. Culture was sent. Catheter was sutured in position and attached to bulb suction. Final CT was acquired. Patient tolerated the procedure well and remained hemodynamically stable throughout. No complications were encountered and no significant blood loss. FINDINGS: Initial CT image demonstrates fluid collection in the left upper quadrant with internal gas. Final CT demonstrates relative  improved size of the fluid with a 12 [place]  drain in position. IMPRESSION: Status post CT-guided drainage of left upper quadrant fluid collection. Signed, Dulcy Fanny. Dellia Nims, RPVI [facility] Electronically Signed   By: Corrie Mckusick D.O.   On: 10/05/2020 17:27    Labs:  CBC: Recent Labs    10/03/20 0421 10/04/20 0233 10/05/20 0355 10/06/20 0356  WBC 17.5* 20.3* 16.7* 13.3*  HGB 10.9* 10.1* 10.4* 10.3*  HCT 32.9* 31.8* 32.1* 32.4*  PLT 369 366 428* 474*    COAGS: No results for input(s): INR, APTT in the last 8760 hours.  BMP: Recent Labs    10/03/20 0421 10/04/20 0233 10/05/20 0355 10/06/20 0356  NA 140 139 140 138  K 3.3* 4.4 3.7 3.7  CL 103 101 101 101  CO2 26 29 26 23   GLUCOSE 94 99 80 89  BUN 16 14 9  7*  CALCIUM 8.0* 8.0* 8.1* 8.0*  CREATININE 0.56 0.54 0.57 0.52  GFRNONAA >60 >60 >60 >60    LIVER FUNCTION TESTS: Recent Labs    10/02/20 0249 10/03/20 0421 10/04/20 0233 10/05/20 0355  BILITOT 0.9 1.2 1.0 1.3*  AST 31 27 22 23   ALT 33 25 19 18   ALKPHOS 114 97 85 100  PROT 5.6* 5.6* 5.5* 5.7*  ALBUMIN 2.0* 2.0* 1.9* 2.0*    Assessment and Plan: Patient with history of gangrenous perforated appendicitis with associated intra-abdominal /retroperitoneal abscesses/feculent peritonitis, status post laparoscopic drainage of abscess x2, appendectomy ,laparoscopy with abdominal washout on 11/23; status post drainage of left upper quadrant fluid collection on 11/30; afebrile, WBC 13.3 down from 16.7, hemoglobin stable, creatinine normal, drain fluid culture pending; continue with drain irrigation and output monitoring; once output minimal over 2 to 3-day span obtain follow-up CT; may need drain injection prior to removal.   Electronically Signed: D. Rowe Robert, PA-C 10/06/2020, 10:22 AM   I spent a total of 15 minutes at the the patient's bedside AND on the patient's hospital floor or unit, greater  than 50% of which was counseling/coordinating care for left upper abdominal fluid collection drain    Patient ID: Molly Manning, female   DOB: Jan 18, 1941, 78 y.o.   MRN: [id number]

## 2020-10-06 NOTE — Plan of Care (Signed)
  Problem: Education: Goal: Knowledge of General Education information will improve Description: Including pain rating scale, medication(s)/side effects and non-pharmacologic comfort measures Outcome: Progressing   Problem: Clinical Measurements: Goal: Will remain free from infection Outcome: Progressing   Problem: Clinical Measurements: Goal: Diagnostic test results will improve Outcome: Progressing   Problem: Activity: Goal: Risk for activity intolerance will decrease Outcome: Progressing

## 2020-10-06 NOTE — Progress Notes (Signed)
OT Cancellation Note  Patient Details Name: Molly Manning MRN: [id number] DOB: 05/31/1941   Cancelled Treatment:    Reason Eval/Treat Not Completed: Patient declined, no reason specified Patient reports feeling nauseous after lunch, declined OT at this time. Will check back as schedule permits.  Delbert Phenix OT OT pager: [phone]   Rosemary Holms 10/06/2020, 2:23 PM

## 2020-10-06 NOTE — Plan of Care (Signed)
  Problem: Pain Managment: Goal: General experience of comfort will improve Outcome: Progressing   

## 2020-10-06 NOTE — Progress Notes (Signed)
Patient ID: Molly Manning, female   DOB: 27-Dec-1940, 79 y.o.   MRN: [id number]  PROGRESS NOTE    Latice Waitman  KZL:[id number] DOB: 02-07-1941 DOA: 09/28/2020 PCP: Deland Pretty, MD   Brief Narrative:  79 y.o.femalewith medical history significant ofHLD, overactive bladder presented on 09/28/2020 with abdominal pain and was found to have perforated appendicitis on CT scan.  She was admitted under general surgery service and she underwent laparoscopic appendectomy with drainage of abscess and abdominal washout by general surgery.  [facility] was consulted for medical management.  Assessment & Plan:   Sepsis: Present on admission Gangrenous perforated appendicitis Intra-abdominal and retroperitoneal abscess Feculent peritonitis -Status post laparoscopic drainage of abscess x2, laparoscopic appendectomy, diagnostic laparoscopy with abdominal washout on 09/28/2020 -Care as per primary team: NG tube removed on 10/02/2020.  Diet advancement as per general surgery. -CT of the abdomen and pelvis showed worsening peritoneal fluid collections consistent with abscesses: Status post IR guided drain placement on 10/05/2020 -Currently on Zosyn and Eraxis as per primary surgical team -Sepsis has currently resolved.  Acute hypoxic respiratory failure Community-acquired pneumonia -Chest x-ray from 09/28/2020 showed patchy airspace opacities within the left midlung and lung base concerning for pneumonia.  Repeat chest x-ray on 09/30/2020 showed persistent small effusions with decreased aerations of both lower lung zones - bandlike atelectasis within L midlung -Respiratory status has improved: Currently on room air -Continue incentive spirometry.  Has been on Zosyn for a while and does not need any more antibiotics for her respiratory condition  Leukocytosis -Improving  RUE Edema - RUE [place] negative for DVT.  Most likely secondary to IV infiltration  Elevated LFTs - resolved  Normocytic Anemia  Iron  Def Anemia       - likely dilutional, possible component of blood loss from surgery, continue to monitor  - labs c/w iron deficiency anemia: Plan for PO iron when able.  Hemoglobin stable for now  Thrombocytosis -Probably reactive.  Elevated BP: she's not on home BP meds.  Blood pressure still on the higher side.  Start amlodipine  Hyperlipidemia -Continue Crestor  Overactive bladder -Continue myrbetriq  Subjective: Patient seen and examined at bedside.  Denies worsening abdominal pain.  Denies worsening shortness of breath, nausea or vomiting.  Objective: Vitals:   10/05/20 2136 10/06/20 0612 10/06/20 0624 10/06/20 1000  BP: (!) 162/77 (!) 164/81  (!) 163/74  Pulse: 88 92  90  Resp: 16 18  18   Temp: 97.8 F (36.6 C) 97.7 F (36.5 C)  98.2 F (36.8 C)  TempSrc:    Oral  SpO2: 96% 97%  95%  Weight:   71.8 kg   Height:        Intake/Output Summary (Last 24 hours) at 10/06/2020 1010 Last data filed at 10/06/2020 0900 Gross per 24 hour  Intake 938.94 ml  Output 400 ml  Net 538.94 ml   Filed Weights   10/03/20 0533 10/05/20 0621 10/06/20 0624  Weight: 76.8 kg 77.8 kg 71.8 kg    Examination:  General exam: Appears calm and comfortable.  Looks chronically ill Respiratory system: Bilateral decreased breath sounds at bases with scattered crackles Cardiovascular system: S1 & S2 heard, Rate controlled Gastrointestinal system: Abdomen is nondistended, soft and mildly tender around the left lower quadrant with dressing and drain present.  Bowel sounds sluggish Extremities: No cyanosis, clubbing; trace lower extremity edema Central nervous system: Alert and oriented. No focal neurological deficits. Moving extremities Skin: No obvious ecchymosis/other lesions Psychiatry: Flat affect    Data Reviewed:  I have personally reviewed following labs and imaging studies  CBC: Recent Labs  Lab 10/01/20 0347 10/01/20 0347 10/02/20 0249 10/03/20 0421 10/04/20 0233  10/05/20 0355 10/06/20 0356  WBC 20.9*   < > 19.0* 17.5* 20.3* 16.7* 13.3*  NEUTROABS 17.8*  --  15.3* 13.5* 16.2* 13.8*  --   HGB 10.5*   < > 11.0* 10.9* 10.1* 10.4* 10.3*  HCT 31.1*   < > 34.0* 32.9* 31.8* 32.1* 32.4*  MCV 91.7   < > 94.4 91.6 95.8 94.7 96.7  PLT 282   < > 345 369 366 428* 474*   < > = values in this interval not displayed.   Basic Metabolic Panel: Recent Labs  Lab 10/01/20 0347 10/01/20 0347 10/02/20 0249 10/03/20 0421 10/04/20 0233 10/05/20 0355 10/06/20 0356  NA 141   < > 143 140 139 140 138  K 3.6   < > 3.3* 3.3* 4.4 3.7 3.7  CL 108   < > 105 103 101 101 101  CO2 22   < > 26 26 29 26 23   GLUCOSE 72   < > 84 94 99 80 89  BUN 33*   < > 22 16 14 9  7*  CREATININE 0.75   < > 0.64 0.56 0.54 0.57 0.52  CALCIUM 8.1*   < > 8.0* 8.0* 8.0* 8.1* 8.0*  MG 2.1  --  1.8 1.8 2.1 1.8  --   PHOS 2.8  --  3.2 3.1 3.1 2.9  --    < > = values in this interval not displayed.   GFR: Estimated Creatinine Clearance: 58.8 mL/min (by C-G formula based on SCr of 0.52 mg/dL). Liver Function Tests: Recent Labs  Lab 10/01/20 0347 10/02/20 0249 10/03/20 0421 10/04/20 0233 10/05/20 0355  AST 57* 31 27 22 23   ALT 48* 33 25 19 18   ALKPHOS 100 114 97 85 100  BILITOT 0.9 0.9 1.2 1.0 1.3*  PROT 5.4* 5.6* 5.6* 5.5* 5.7*  ALBUMIN 1.9* 2.0* 2.0* 1.9* 2.0*   No results for input(s): LIPASE, AMYLASE in the last 168 hours. No results for input(s): AMMONIA in the last 168 hours. Coagulation Profile: No results for input(s): INR, PROTIME in the last 168 hours. Cardiac Enzymes: No results for input(s): CKTOTAL, CKMB, CKMBINDEX, TROPONINI in the last 168 hours. BNP (last 3 results) No results for input(s): PROBNP in the last 8760 hours. HbA1C: No results for input(s): HGBA1C in the last 72 hours. CBG: No results for input(s): GLUCAP in the last 168 hours. Lipid Profile: No results for input(s): CHOL, HDL, LDLCALC, TRIG, CHOLHDL, LDLDIRECT in the last 72 hours. Thyroid Function  Tests: No results for input(s): TSH, T4TOTAL, FREET4, T3FREE, THYROIDAB in the last 72 hours. Anemia Panel: No results for input(s): VITAMINB12, FOLATE, FERRITIN, TIBC, IRON, RETICCTPCT in the last 72 hours. Sepsis Labs: No results for input(s): PROCALCITON, LATICACIDVEN in the last 168 hours.  Recent Results (from the past 240 hour(s))  Resp Panel by RT-PCR (Flu A&B, Covid) Nasopharyngeal Swab     Status: None   Collection Time: 09/28/20  9:54 AM   Specimen: Nasopharyngeal Swab; Nasopharyngeal(NP) swabs in vial transport medium  Result Value Ref Range Status   SARS Coronavirus 2 by RT PCR NEGATIVE NEGATIVE Final    Comment: (NOTE) SARS-CoV-2 target nucleic acids are NOT DETECTED.  The SARS-CoV-2 RNA is generally detectable in upper respiratory specimens during the acute phase of infection. The lowest concentration of SARS-CoV-2 viral copies this assay can detect is 138  copies/mL. A negative result does not preclude SARS-Cov-2 infection and should not be used as the sole basis for treatment or other patient management decisions. A negative result may occur with  improper specimen collection/handling, submission of specimen other than nasopharyngeal swab, presence of viral mutation(s) within the areas targeted by this assay, and inadequate number of viral copies(<138 copies/mL). A negative result must be combined with clinical observations, patient history, and epidemiological information. The expected result is Negative.  Fact Sheet for Patients:  EntrepreneurPulse.com.au  Fact Sheet for Healthcare Providers:  IncredibleEmployment.be  This test is no t yet approved or cleared by the [place] FDA and  has been authorized for detection and/or diagnosis of SARS-CoV-2 by FDA under an Emergency Use Authorization (EUA). This EUA will remain  in effect (meaning this test can be used) for the duration of the COVID-19 declaration under Section  564(b)(1) of the Act, 21 U.S.C.section 360bbb-3(b)(1), unless the authorization is terminated  or revoked sooner.       Influenza A by PCR NEGATIVE NEGATIVE Final   Influenza B by PCR NEGATIVE NEGATIVE Final    Comment: (NOTE) The Xpert Xpress SARS-CoV-2/FLU/RSV plus assay is intended as an aid in the diagnosis of influenza from Nasopharyngeal swab specimens and should not be used as a sole basis for treatment. Nasal washings and aspirates are unacceptable for Xpert Xpress SARS-CoV-2/FLU/RSV testing.  Fact Sheet for Patients: EntrepreneurPulse.com.au  Fact Sheet for Healthcare Providers: IncredibleEmployment.be  This test is not yet approved or cleared by the [place] FDA and has been authorized for detection and/or diagnosis of SARS-CoV-2 by FDA under an Emergency Use Authorization (EUA). This EUA will remain in effect (meaning this test can be used) for the duration of the COVID-19 declaration under Section 564(b)(1) of the Act, 21 U.S.C. section 360bbb-3(b)(1), unless the authorization is terminated or revoked.  Performed at [facility], [place] [address] [place], [place] [address]   MRSA PCR Screening     Status: None   Collection Time: 09/28/20  8:15 PM   Specimen: Nasopharyngeal Wash  Result Value Ref Range Status   MRSA by PCR NEGATIVE NEGATIVE Final    Comment:        The GeneXpert MRSA Assay (FDA approved for NASAL specimens only), is one component of a comprehensive MRSA colonization surveillance program. It is not intended to diagnose MRSA infection nor to guide or monitor treatment for MRSA infections. Performed at [facility], [place] [address], [place], [place] [address]   Aerobic/Anaerobic Culture (surgical/deep wound)     Status: None (Preliminary result)   Collection Time: 10/05/20  4:03 PM   Specimen: Abscess  Result Value Ref Range Status   Specimen Description   Final     ABSCESS ABDOMEN Performed at [place] [address] [place], [place] [address]    Special Requests   Final    NONE Performed at [facility], [place] [address], [place], [place] [address]    Gram Stain   Final    ABUNDANT WBC PRESENT,BOTH PMN AND MONONUCLEAR NO ORGANISMS SEEN Performed at [facility], [place] [address], [place],  [address]    Culture PENDING  Incomplete   Report Status PENDING  Incomplete         Radiology Studies: CT ABDOMEN PELVIS W CONTRAST  Result Date: 10/04/2020 CLINICAL DATA:  Status post appendectomy on 09/28/2020. Abdominal infection suspected. EXAM: CT ABDOMEN AND PELVIS WITH CONTRAST TECHNIQUE: Multidetector CT  imaging of the abdomen and pelvis was performed using the standard protocol following bolus administration of intravenous contrast. CONTRAST:  192mL OMNIPAQUE IOHEXOL 300 MG/ML  SOLN COMPARISON:  09/28/2020 FINDINGS: Lower chest: Moderate bilateral pleural effusions with associated dependent compressive atelectasis. Hepatobiliary: Small irregular area of hypoattenuation along the inferior margin of the right liver lobe, not evident on the prior exam. This is suspicious for a small early abscess. It measures approximately 1.4 cm in greatest dimension. No other liver abnormality. There are dependent gallstones. There is gallbladder wall thickening and/or pericholecystic fluid, presumed reactive to the adjacent inflammatory changes. No bile duct dilation. Pancreas: No mass. Normal enhancement. There is adjacent fluid attenuation, but no convincing pancreatitis. Spleen: Normal in size without focal abnormality. Adrenals/Urinary Tract: No adrenal masses. Kidneys normal in size, orientation and position with symmetric enhancement and excretion. 2 cm anterior lower pole right renal cyst. Adjacent tiny lesion has also likely a cyst. No other renal masses, no stones and no hydronephrosis. Normal ureters. Bladder  minimally distended otherwise unremarkable. Stomach/Bowel/Peritoneal cavity: Multiple peritoneal collections. Largest collection is in the left quadrant left mid abdomen, causing a concave indentation along the greater curvature of the stomach. This collection measures 18 x 6.4 x 7.7 cm. There are few non dependent bubbles of air along its superior margin. Another collection lies abutting the posteromedial liver, which may be intrahepatic along the base of the caudate lobe, measuring 4.4 x 4.5 x 4.4 cm. A third collection lies along the right pericolic gutter measuring approximately 7.2 x 3.1 x 3.8 cm. A small amount of subcapsular fluid extends along the inferior margin of the liver. Trace free fluid is noted in the pelvis. There are additional areas hazy and reticular opacity consistent inflammation/edema. Stomach is mostly decompressed, compressed by the left upper quadrant collection, otherwise unremarkable. Small bowel and colon are normal in caliber. Mild wall thickening noted of the ileum, presumed reactive to adjacent retrocecal inflammation. There are numerous colonic diverticula, most evident along the sigmoid, without evidence of diverticulitis. No free intraperitoneal air or retroperitoneal air. Vascular/Lymphatic: Dense aortic atherosclerosis. No aneurysm. No venous thrombosis. Enlarged left inguinal lymph node, 1.7 cm in short axis. No other enlarged lymph nodes. Reproductive: Postmenopausal uterus with associated calcifications consistent small fibroids. No adnexal masses. Other: Deep subcutaneous soft tissue air no along the right anterolateral abdomen. Trocar insertion sites evident in the left lower quadrant and left mid abdomen. Subcutaneous edema is noted along the lower abdomen and pelvis laterally. Musculoskeletal: No fracture or acute finding.  No bone lesion. IMPRESSION: 1. Interval worsening of peritoneal fluid collections consistent with abscesses. Largest collection lies the left upper  quadrant, abutting the greater curvature of the stomach, significantly increased in size from the prior CT, now 18 cm in long axis. There is another collection that lies adjacent to, possibly extending into, the posteromedial liver adjacent to the caudate lobe, 4.5 cm in greatest dimension, also increased. Right pericolic gutter collection is also mildly increased in size, but is better defined than on the prior exam. 2. No free intraperitoneal air. 3. There are changes consistent with the interval laparoscopic appendectomy perform since the prior CT. Changes include deep subcutaneous soft tissue air. Electronically Signed   By: Lajean Manes M.D.   On: 10/04/2020 16:42   CT IMAGE GUIDED DRAINAGE BY PERCUTANEOUS CATHETER  Result Date: 10/05/2020 INDICATION: 79 year old female with left upper quadrant fluid collection referred for drainage EXAM: CT GUIDED DRAINAGE OF  ABSCESS MEDICATIONS: The patient is currently admitted to  the hospital and receiving intravenous antibiotics. The antibiotics were administered within an appropriate time frame prior to the initiation of the procedure. ANESTHESIA/SEDATION: 2.0 mg IV Versed 100 mcg IV Fentanyl Moderate Sedation Time:  16 minutes The patient was continuously monitored during the procedure by the interventional radiology nurse under my direct supervision. COMPLICATIONS: None TECHNIQUE: Informed written consent was obtained from the patient after a thorough discussion of the procedural risks, benefits and alternatives. All questions were addressed. Maximal Sterile Barrier Technique was utilized including caps, mask, sterile gowns, sterile gloves, sterile drape, hand hygiene and skin antiseptic. A timeout was performed prior to the initiation of the procedure. PROCEDURE: The operative field was prepped with Chlorhexidine in a sterile fashion, and a sterile drape was applied covering the operative field. A sterile gown and sterile gloves were used for the procedure.  Local anesthesia was provided with 1% Lidocaine. Scout CT was acquired. The patient is prepped and draped in the usual sterile fashion. 1% lidocaine was used for local anesthesia. Trocar needle was advanced into the fluid collection of the left abdomen. Once we confirmed needle tip position, wire was advanced and then modified Seldinger technique was used to place a 12 [place] drain into the fluid collection. Approximately 280 cc of murky yellow fluid was aspirated. Culture was sent. Catheter was sutured in position and attached to bulb suction. Final CT was acquired. Patient tolerated the procedure well and remained hemodynamically stable throughout. No complications were encountered and no significant blood loss. FINDINGS: Initial CT image demonstrates fluid collection in the left upper quadrant with internal gas. Final CT demonstrates relative improved size of the fluid with a 12 [place] drain in position. IMPRESSION: Status post CT-guided drainage of left upper quadrant fluid collection. Signed, Dulcy Fanny. Dellia Nims, RPVI [facility] Electronically Signed   By: Corrie Mckusick D.O.   On: 10/05/2020 17:27        Scheduled Meds: . acetaminophen  1,000 mg Oral TID  . aspirin  81 mg Oral Daily  . Chlorhexidine Gluconate Cloth  6 each Topical Daily  . enoxaparin (LOVENOX) injection  40 mg Subcutaneous Q24H  . lip balm  1 application Topical BID  . mouth rinse  15 mL Mouth Rinse BID  . mirabegron ER  50 mg Oral Daily  . multivitamin with minerals  1 tablet Oral Daily  . pantoprazole (PROTONIX) IV  40 mg Intravenous QHS  . psyllium  1 packet Oral BID  . rosuvastatin  10 mg Oral Daily  . sodium chloride flush  3 mL Intravenous Q12H  . sodium chloride flush  5 mL Intracatheter Q8H   Continuous Infusions: . sodium chloride Stopped (10/04/20 0513)  . sodium chloride    . anidulafungin 100 mg (10/05/20 1023)  . piperacillin-tazobactam (ZOSYN)   IV 3.375 g (10/06/20 8250)          Aline August, MD [facility] 10/06/2020, 10:10 AM

## 2020-10-06 NOTE — Progress Notes (Signed)
8 Days Post-Op  Subjective: Feeling better today, much less pain.  Some hunger.  No nausea.  Having some diarrhea.  Mobilizing with husband.  ROS: See above, otherwise other systems negative  Objective: Vital signs in last 24 hours: Temp:  [97.2 F (36.2 C)-98.2 F (36.8 C)] 98.2 F (36.8 C) (12/01 1000) Pulse Rate:  [88-101] 90 (12/01 1000) Resp:  [14-25] 18 (12/01 1000) BP: (143-172)/(63-81) 163/74 (12/01 1000) SpO2:  [95 %-100 %] 95 % (12/01 1000) Weight:  [71.8 kg] 71.8 kg (12/01 0624) Last BM Date: 10/05/20  Intake/Output from previous day: 11/30 0701 - 12/01 0700 In: 538.9 [P.O.:50; I.V.:206.1; IV Piggyback:277.8] Out: 400 [Drains:400] Intake/Output this shift: Total I/O In: 400 [P.O.:400] Out: 0   PE: Heart: regular Lungs: CTAB Abd: soft, appropriately tender, drain in place with light tan milky drainage, 400cc since placement yesterday, incisions are c/d/i, +BS  Lab Results:  Recent Labs    10/05/20 0355 10/06/20 0356  WBC 16.7* 13.3*  HGB 10.4* 10.3*  HCT 32.1* 32.4*  PLT 428* 474*   BMET Recent Labs    10/05/20 0355 10/06/20 0356  NA 140 138  K 3.7 3.7  CL 101 101  CO2 26 23  GLUCOSE 80 89  BUN 9 7*  CREATININE 0.57 0.52  CALCIUM 8.1* 8.0*   PT/INR No results for input(s): LABPROT, INR in the last 72 hours. CMP     Component Value Date/Time   NA 138 10/06/2020 0356   K 3.7 10/06/2020 0356   CL 101 10/06/2020 0356   CO2 23 10/06/2020 0356   GLUCOSE 89 10/06/2020 0356   BUN 7 (L) 10/06/2020 0356   CREATININE 0.52 10/06/2020 0356   CALCIUM 8.0 (L) 10/06/2020 0356   PROT 5.7 (L) 10/05/2020 0355   ALBUMIN 2.0 (L) 10/05/2020 0355   AST 23 10/05/2020 0355   ALT 18 10/05/2020 0355   ALKPHOS 100 10/05/2020 0355   BILITOT 1.3 (H) 10/05/2020 0355   GFRNONAA >60 10/06/2020 0356   Lipase     Component Value Date/Time   LIPASE 25 09/28/2020 0850       Studies/Results: CT ABDOMEN PELVIS W CONTRAST  Result Date:  10/04/2020 CLINICAL DATA:  Status post appendectomy on 09/28/2020. Abdominal infection suspected. EXAM: CT ABDOMEN AND PELVIS WITH CONTRAST TECHNIQUE: Multidetector CT imaging of the abdomen and pelvis was performed using the standard protocol following bolus administration of intravenous contrast. CONTRAST:  166mL OMNIPAQUE IOHEXOL 300 MG/ML  SOLN COMPARISON:  09/28/2020 FINDINGS: Lower chest: Moderate bilateral pleural effusions with associated dependent compressive atelectasis. Hepatobiliary: Small irregular area of hypoattenuation along the inferior margin of the right liver lobe, not evident on the prior exam. This is suspicious for a small early abscess. It measures approximately 1.4 cm in greatest dimension. No other liver abnormality. There are dependent gallstones. There is gallbladder wall thickening and/or pericholecystic fluid, presumed reactive to the adjacent inflammatory changes. No bile duct dilation. Pancreas: No mass. Normal enhancement. There is adjacent fluid attenuation, but no convincing pancreatitis. Spleen: Normal in size without focal abnormality. Adrenals/Urinary Tract: No adrenal masses. Kidneys normal in size, orientation and position with symmetric enhancement and excretion. 2 cm anterior lower pole right renal cyst. Adjacent tiny lesion has also likely a cyst. No other renal masses, no stones and no hydronephrosis. Normal ureters. Bladder minimally distended otherwise unremarkable. Stomach/Bowel/Peritoneal cavity: Multiple peritoneal collections. Largest collection is in the left quadrant left mid abdomen, causing a concave indentation along the greater curvature of the stomach. This  collection measures 18 x 6.4 x 7.7 cm. There are few non dependent bubbles of air along its superior margin. Another collection lies abutting the posteromedial liver, which may be intrahepatic along the base of the caudate lobe, measuring 4.4 x 4.5 x 4.4 cm. A third collection lies along the right  pericolic gutter measuring approximately 7.2 x 3.1 x 3.8 cm. A small amount of subcapsular fluid extends along the inferior margin of the liver. Trace free fluid is noted in the pelvis. There are additional areas hazy and reticular opacity consistent inflammation/edema. Stomach is mostly decompressed, compressed by the left upper quadrant collection, otherwise unremarkable. Small bowel and colon are normal in caliber. Mild wall thickening noted of the ileum, presumed reactive to adjacent retrocecal inflammation. There are numerous colonic diverticula, most evident along the sigmoid, without evidence of diverticulitis. No free intraperitoneal air or retroperitoneal air. Vascular/Lymphatic: Dense aortic atherosclerosis. No aneurysm. No venous thrombosis. Enlarged left inguinal lymph node, 1.7 cm in short axis. No other enlarged lymph nodes. Reproductive: Postmenopausal uterus with associated calcifications consistent small fibroids. No adnexal masses. Other: Deep subcutaneous soft tissue air no along the right anterolateral abdomen. Trocar insertion sites evident in the left lower quadrant and left mid abdomen. Subcutaneous edema is noted along the lower abdomen and pelvis laterally. Musculoskeletal: No fracture or acute finding.  No bone lesion. IMPRESSION: 1. Interval worsening of peritoneal fluid collections consistent with abscesses. Largest collection lies the left upper quadrant, abutting the greater curvature of the stomach, significantly increased in size from the prior CT, now 18 cm in long axis. There is another collection that lies adjacent to, possibly extending into, the posteromedial liver adjacent to the caudate lobe, 4.5 cm in greatest dimension, also increased. Right pericolic gutter collection is also mildly increased in size, but is better defined than on the prior exam. 2. No free intraperitoneal air. 3. There are changes consistent with the interval laparoscopic appendectomy perform since the  prior CT. Changes include deep subcutaneous soft tissue air. Electronically Signed   By: Lajean Manes M.D.   On: 10/04/2020 16:42   CT IMAGE GUIDED DRAINAGE BY PERCUTANEOUS CATHETER  Result Date: 10/05/2020 INDICATION: 79 year old female with left upper quadrant fluid collection referred for drainage EXAM: CT GUIDED DRAINAGE OF  ABSCESS MEDICATIONS: The patient is currently admitted to the hospital and receiving intravenous antibiotics. The antibiotics were administered within an appropriate time frame prior to the initiation of the procedure. ANESTHESIA/SEDATION: 2.0 mg IV Versed 100 mcg IV Fentanyl Moderate Sedation Time:  16 minutes The patient was continuously monitored during the procedure by the interventional radiology nurse under my direct supervision. COMPLICATIONS: None TECHNIQUE: Informed written consent was obtained from the patient after a thorough discussion of the procedural risks, benefits and alternatives. All questions were addressed. Maximal Sterile Barrier Technique was utilized including caps, mask, sterile gowns, sterile gloves, sterile drape, hand hygiene and skin antiseptic. A timeout was performed prior to the initiation of the procedure. PROCEDURE: The operative field was prepped with Chlorhexidine in a sterile fashion, and a sterile drape was applied covering the operative field. A sterile gown and sterile gloves were used for the procedure. Local anesthesia was provided with 1% Lidocaine. Scout CT was acquired. The patient is prepped and draped in the usual sterile fashion. 1% lidocaine was used for local anesthesia. Trocar needle was advanced into the fluid collection of the left abdomen. Once we confirmed needle tip position, wire was advanced and then modified Seldinger technique was used to place  a 12 French drain into the fluid collection. Approximately 280 cc of murky yellow fluid was aspirated. Culture was sent. Catheter was sutured in position and attached to bulb suction.  Final CT was acquired. Patient tolerated the procedure well and remained hemodynamically stable throughout. No complications were encountered and no significant blood loss. FINDINGS: Initial CT image demonstrates fluid collection in the left upper quadrant with internal gas. Final CT demonstrates relative improved size of the fluid with a 12 [place] drain in position. IMPRESSION: Status post CT-guided drainage of left upper quadrant fluid collection. Signed, Dulcy Fanny. Dellia Nims, RPVI [facility] Rad[facility] Electronically Signed   By: Corrie Mckusick D.O.   On: 10/05/2020 17:27    Anti-infectives: Anti-infectives (From admission, onward)   Start     Dose/Rate Route Frequency Ordered Stop   10/02/20 1000  anidulafungin (ERAXIS) 100 mg in sodium chloride 0.9 % 100 mL IVPB        100 mg 78 mL/hr over 100 Minutes Intravenous Every 24 hours 10/01/20 0958 10/08/20 0856   10/01/20 1100  anidulafungin (ERAXIS) 200 mg in sodium chloride 0.9 % 200 mL IVPB        200 mg 78 mL/hr over 200 Minutes Intravenous  Once 10/01/20 1014 10/01/20 1638   09/29/20 2200  piperacillin-tazobactam (ZOSYN) IVPB 3.375 g        3.375 g 12.5 mL/hr over 240 Minutes Intravenous Every 8 hours 09/29/20 1524 10/08/20 0856   09/28/20 2000  piperacillin-tazobactam (ZOSYN) IVPB 3.375 g  Status:  Discontinued        3.375 g 12.5 mL/hr over 240 Minutes Intravenous Every 8 hours 09/28/20 1937 09/29/20 1524   09/28/20 1830  clindamycin (CLEOCIN) 900 mg, gentamicin (GARAMYCIN) 240 mg in sodium chloride 0.9 % 1,000 mL for intraperitoneal lavage         Irrigation To Surgery 09/28/20 1736 09/28/20 1806   09/28/20 1630  clindamycin (CLEOCIN) 900 mg, gentamicin (GARAMYCIN) 240 mg in sodium chloride 0.9 % 1,000 mL for intraperitoneal lavage  Status:  Discontinued       Note to Pharmacy: Have in the  [place] room for final irrigation in bowel surgery case to minimize risk of abscess/infection Pharmacy may  adjust dosing strength, schedule, rate of infusion, etc as needed to optimize therapy    Irrigation To Surgery 09/28/20 1618 09/28/20 1649   09/28/20 1600  cefoTEtan (CEFOTAN) 2 g in sodium chloride 0.9 % 100 mL IVPB        2 g 200 mL/hr over 30 Minutes Intravenous On call to O.R. 09/28/20 1320 09/28/20 1605   09/28/20 1145  piperacillin-tazobactam (ZOSYN) IVPB 3.375 g        3.375 g 100 mL/hr over 30 Minutes Intravenous  Once 09/28/20 1144 09/28/20 1353       Assessment/Plan HLD OAB AKI -improved, Cr 0.54 ABL anemia- Hgb 10.1, stable Severe protein calorie malnutrition - prealbumin <5 (11/24).  Hypomagnesemia -2.1 this AM  Abdominal pain/free air Gangrenous perforated appendicitis, intra-abdominal and retroperitoneal abscesses, feculent peritonitis, POD 8, S/pLAPAROSCOPIC DRAINAGE OF ABSESS X 2,LAPAROSCOPIC APPENDECTOMY,DIAGNOSTIC LAPAROSCOPYWITH ABDOMINAL WASHOUT11/23 Dr. Johney Maine - surgical path pending- acute appendicitis -both JP were removed 11/29 -WBC down to 13K today, cont abx therapy - CLD, resource breeze - continue to mobilize as tolerated - IR drain on 11/30 for abscess, cx pending but no GTD so far.  ID -[id number]>> day 7 VTE -SCDs, lovenox  FEN -IVF, CLD, resource breeze Foley -removed 11/24 Follow up -Dr. Johney Maine  LOS: 8 days    Henreitta Cea , [facility] Surgery 10/06/2020, 10:34 AM Please see Amion for pager number during day hours 7:00am-4:30pm or 7:00am -11:30am on weekends

## 2020-10-07 DIAGNOSIS — K3521 Acute appendicitis with generalized peritonitis, with abscess: Secondary | ICD-10-CM | POA: Diagnosis not present

## 2020-10-07 LAB — CBC
HCT: 31.4 % — ABNORMAL LOW (ref 36.0–46.0)
Hemoglobin: 10.1 g/dL — ABNORMAL LOW (ref 12.0–15.0)
MCH: 30.4 pg (ref 26.0–34.0)
MCHC: 32.2 g/dL (ref 30.0–36.0)
MCV: 94.6 fL (ref 80.0–100.0)
Platelets: 478 10*3/uL — ABNORMAL HIGH (ref 150–400)
RBC: 3.32 MIL/uL — ABNORMAL LOW (ref 3.87–5.11)
RDW: 14.2 % (ref 11.5–15.5)
WBC: 12.3 10*3/uL — ABNORMAL HIGH (ref 4.0–10.5)
nRBC: 0 % (ref 0.0–0.2)

## 2020-10-07 NOTE — Care Management Important Message (Signed)
Important Message  Patient Details IM Letter given to the Patient Name: Molly Manning MRN: [id number] Date of Birth: 12/14/1940   Medicare Important Message Given:  Yes     Kerin Salen 10/07/2020, 1:17 PM

## 2020-10-07 NOTE — Progress Notes (Signed)
Occupational Therapy Treatment Patient Details Name: Molly Manning MRN: [id number] DOB: 1941/08/20 Today's Date: 10/07/2020    History of present illness 79 y.o. female admitted with gangrenous perforated appendicitis & abscess. s/p laparoscopic appendectomy and washout 09/28/20. PMH of HLD.   OT comments  Patient progressing well with functional transfers and ambulation with rolling walker at mod I level. Patient does still require assist with LB ADLs due to mild abdominal pain/pulling/soreness requiring mod A to don socks. Educate patient in increasing OOB activity in addition to ambulation with spouse sitting up in chair throughout the day. Patient reports she has sat up for "a few minutes at a time." Encouraged patient to sit up for 30 mins to day for breakfast.    Follow Up Recommendations  No OT follow up;Supervision - Intermittent    Equipment Recommendations  Tub/shower seat       Precautions / Restrictions Precautions Precautions: Other (comment) Precaution Comments: abdominal surgery, instructed pt in log roll, 1 drain       Mobility Bed Mobility Overal bed mobility: Modified Independent             General bed mobility comments: demonstrates adeqaute techique of log roll   Transfers Overall transfer level: Modified independent                    Balance Overall balance assessment: Modified Independent                                         ADL either performed or assessed with clinical judgement   ADL Overall ADL's : Needs assistance/impaired     Grooming: Wash/dry hands;Wash/dry face;Oral care;Brushing hair;Modified independent;Standing               Lower Body Dressing: Moderate assistance;Sitting/lateral leans Lower Body Dressing Details (indicate cue type and reason): patient able to reach L sock without AD or assist, required assist to don R sock.  Toilet Transfer: Modified Independent;Ambulation;RW;Regular Toilet    Toileting- Clothing Manipulation and Hygiene: Modified independent;Sit to/from stand;Sitting/lateral lean       Functional mobility during ADLs: Modified independent;Rolling walker General ADL Comments: patient progressing well with self care, still requiring mod A for LB ADLs, patient recall sock aid and reacher from previous session and reiterated to patient can purchase for home as patient states before surgery her R LE was giving her trouble. Also educate patient ways to increase activity tolerance including increasing time OOB. Patient reports she has only been up to chair for "A few minutes at a time."                Cognition Arousal/Alertness: Awake/alert Behavior During Therapy: WFL for tasks assessed/performed Overall Cognitive Status: Within Functional Limits for tasks assessed                                                     Pertinent Vitals/ Pain       Pain Assessment: Faces Faces Pain Scale: Hurts a little bit Pain Location: abdomen with LB dressing Pain Descriptors / Indicators: Sore Pain Intervention(s): Monitored during session         Frequency  Min 2X/week        Progress Toward Goals  OT  Goals(current goals can now be found in the care plan section)  Progress towards OT goals: Progressing toward goals  Acute Rehab OT Goals Patient Stated Goal: tennis, golf OT Goal Formulation: With patient Time For Goal Achievement: 10/14/20 Potential to Achieve Goals: Good ADL Goals Pt Will Perform Grooming: with modified independence;sitting;standing Pt Will Perform Upper Body Dressing: with modified independence;sitting Pt Will Perform Lower Body Dressing: with modified independence;sit to/from stand;sitting/lateral leans;with adaptive equipment Pt Will Transfer to Toilet: with modified independence;ambulating Pt Will Perform Toileting - Clothing Manipulation and hygiene: with modified independence;sitting/lateral leans;sit to/from  stand  Plan Discharge plan remains appropriate       AM-PAC OT "6 Clicks" Daily Activity     Outcome Measure   Help from another person eating meals?: None Help from another person taking care of personal grooming?: None Help from another person toileting, which includes using toliet, bedpan, or urinal?: None Help from another person bathing (including washing, rinsing, drying)?: A Lot Help from another person to put on and taking off regular upper body clothing?: None Help from another person to put on and taking off regular lower body clothing?: A Lot 6 Click Score: 20    End of Session Equipment Utilized During Treatment: Rolling walker  OT Visit Diagnosis: Pain Pain - part of body:  (abdomen)   Activity Tolerance Patient tolerated treatment well   Patient Left in chair;with call bell/phone within reach   Nurse Communication Mobility status        Time: 4128-7867 OT Time Calculation (min): 17 min  Charges: OT General Charges $OT Visit: 1 Visit OT Treatments $Self Care/Home Management : 8-22 mins  Delbert Phenix OT OT pager: [place] 10/07/2020, 9:13 AM

## 2020-10-07 NOTE — Progress Notes (Signed)
Progress Note  9 Days Post-Op  Subjective: Patient reports she had a lot of pain overnight but is feeling a little better this AM. She is having diarrhea. Tolerating CLD. She is nervous about possibility of going home with a drain. She walked in hallway yesterday.   Objective: Vital signs in last 24 hours: Temp:  [97.9 F (36.6 C)-98.2 F (36.8 C)] 98.2 F (36.8 C) (12/02 0529) Pulse Rate:  [92-100] 100 (12/02 0529) Resp:  [14-17] 16 (12/02 0529) BP: (155-170)/(69-74) 155/74 (12/02 0529) SpO2:  [94 %-96 %] 94 % (12/02 0529) Weight:  [71.1 kg] 71.1 kg (12/02 0500) Last BM Date: 10/07/20  Intake/Output from previous day: 12/01 0701 - 12/02 0700 In: 1151.4 [P.O.:650; I.V.:171.4; IV Piggyback:330] Out: 34 [Drains:70] Intake/Output this shift: Total I/O In: 31.7 [I.V.:18; IV Piggyback:13.8] Out: -   PE: General: pleasant, WD, elderly female who is laying in bed in NAD Heart: regular, rate, and rhythm.   Lungs: CTAB, no wheezes, rhonchi, or rales noted.  Respiratory effort nonlabored Abd: soft, very mildly ttp in RLQ without peritonitis, mildly distended, +BS, drain in L abdomen with purulent fluid, incisions c/d/i    Lab Results:  Recent Labs    10/06/20 0356 10/07/20 0333  WBC 13.3* 12.3*  HGB 10.3* 10.1*  HCT 32.4* 31.4*  PLT 474* 478*   BMET Recent Labs    10/05/20 0355 10/06/20 0356  NA 140 138  K 3.7 3.7  CL 101 101  CO2 26 23  GLUCOSE 80 89  BUN 9 7*  CREATININE 0.57 0.52  CALCIUM 8.1* 8.0*   PT/INR No results for input(s): LABPROT, INR in the last 72 hours. CMP     Component Value Date/Time   NA 138 10/06/2020 0356   K 3.7 10/06/2020 0356   CL 101 10/06/2020 0356   CO2 23 10/06/2020 0356   GLUCOSE 89 10/06/2020 0356   BUN 7 (L) 10/06/2020 0356   CREATININE 0.52 10/06/2020 0356   CALCIUM 8.0 (L) 10/06/2020 0356   PROT 5.7 (L) 10/05/2020 0355   ALBUMIN 2.0 (L) 10/05/2020 0355   AST 23 10/05/2020 0355   ALT 18 10/05/2020 0355   ALKPHOS  100 10/05/2020 0355   BILITOT 1.3 (H) 10/05/2020 0355   GFRNONAA >60 10/06/2020 0356   Lipase     Component Value Date/Time   LIPASE 25 09/28/2020 0850       Studies/Results: CT IMAGE GUIDED DRAINAGE BY PERCUTANEOUS CATHETER  Result Date: 10/05/2020 INDICATION: 79 year old female with left upper quadrant fluid collection referred for drainage EXAM: CT GUIDED DRAINAGE OF  ABSCESS MEDICATIONS: The patient is currently admitted to the hospital and receiving intravenous antibiotics. The antibiotics were administered within an appropriate time frame prior to the initiation of the procedure. ANESTHESIA/SEDATION: 2.0 mg IV Versed 100 mcg IV Fentanyl Moderate Sedation Time:  16 minutes The patient was continuously monitored during the procedure by the interventional radiology nurse under my direct supervision. COMPLICATIONS: None TECHNIQUE: Informed written consent was obtained from the patient after a thorough discussion of the procedural risks, benefits and alternatives. All questions were addressed. Maximal Sterile Barrier Technique was utilized including caps, mask, sterile gowns, sterile gloves, sterile drape, hand hygiene and skin antiseptic. A timeout was performed prior to the initiation of the procedure. PROCEDURE: The operative field was prepped with Chlorhexidine in a sterile fashion, and a sterile drape was applied covering the operative field. A sterile gown and sterile gloves were used for the procedure. Local anesthesia was provided with 1%  Lidocaine. Scout CT was acquired. The patient is prepped and draped in the usual sterile fashion. 1% lidocaine was used for local anesthesia. Trocar needle was advanced into the fluid collection of the left abdomen. Once we confirmed needle tip position, wire was advanced and then modified Seldinger technique was used to place a 12 [place] drain into the fluid collection. Approximately 280 cc of murky yellow fluid was aspirated. Culture was sent. Catheter  was sutured in position and attached to bulb suction. Final CT was acquired. Patient tolerated the procedure well and remained hemodynamically stable throughout. No complications were encountered and no significant blood loss. FINDINGS: Initial CT image demonstrates fluid collection in the left upper quadrant with internal gas. Final CT demonstrates relative improved size of the fluid with a 12 [place] drain in position. IMPRESSION: Status post CT-guided drainage of left upper quadrant fluid collection. Signed, Dulcy Fanny. Dellia Nims, RPVI Vascular and Interventional Radiology Specialists [facility] Radiology Electronically Signed   By: Corrie Mckusick D.O.   On: 10/05/2020 17:27    Anti-infectives: Anti-infectives (From admission, onward)   Start     Dose/Rate Route Frequency Ordered Stop   10/02/20 1000  anidulafungin (ERAXIS) 100 mg in sodium chloride 0.9 % 100 mL IVPB        100 mg 78 mL/hr over 100 Minutes Intravenous Every 24 hours 10/01/20 0958 10/08/20 0856   10/01/20 1100  anidulafungin (ERAXIS) 200 mg in sodium chloride 0.9 % 200 mL IVPB        200 mg 78 mL/hr over 200 Minutes Intravenous  Once 10/01/20 1014 10/01/20 1638   09/29/20 2200  piperacillin-tazobactam (ZOSYN) IVPB 3.375 g        3.375 g 12.5 mL/hr over 240 Minutes Intravenous Every 8 hours 09/29/20 1524 10/08/20 0856   09/28/20 2000  piperacillin-tazobactam (ZOSYN) IVPB 3.375 g  Status:  Discontinued        3.375 g 12.5 mL/hr over 240 Minutes Intravenous Every 8 hours 09/28/20 1937 09/29/20 1524   09/28/20 1830  clindamycin (CLEOCIN) 900 mg, gentamicin (GARAMYCIN) 240 mg in sodium chloride 0.9 % 1,000 mL for intraperitoneal lavage         Irrigation To Surgery 09/28/20 1736 09/28/20 1806   09/28/20 1630  clindamycin (CLEOCIN) 900 mg, gentamicin (GARAMYCIN) 240 mg in sodium chloride 0.9 % 1,000 mL for intraperitoneal lavage  Status:  Discontinued       Note to Pharmacy: Have in the  [place] room for final irrigation in bowel surgery  case to minimize risk of abscess/infection Pharmacy may adjust dosing strength, schedule, rate of infusion, etc as needed to optimize therapy    Irrigation To Surgery 09/28/20 1618 09/28/20 1649   09/28/20 1600  cefoTEtan (CEFOTAN) 2 g in sodium chloride 0.9 % 100 mL IVPB        2 g 200 mL/hr over 30 Minutes Intravenous On call to O.R. 09/28/20 1320 09/28/20 1605   09/28/20 1145  piperacillin-tazobactam (ZOSYN) IVPB 3.375 g        3.375 g 100 mL/hr over 30 Minutes Intravenous  Once 09/28/20 1144 09/28/20 1353       Assessment/Plan HLD OAB AKI -improved, Cr 0.52 yesterday ABL anemia- Hgb 10.1, stable Severe protein calorie malnutrition - prealbumin 7.2 (12/1).   Abdominal pain/free air Gangrenous perforated appendicitis, intra-abdominal and retroperitoneal abscesses, feculent peritonitis, S/pLAPAROSCOPIC DRAINAGE OF ABSESS X 2,LAPAROSCOPIC APPENDECTOMY,DIAGNOSTIC LAPAROSCOPYWITH ABDOMINAL WASHOUT11/23 Dr. Johney Maine - POD#9 - surgical path - acute appendicitis -both JP were removed 11/29 -WBC down to 12K  today, cont abx therapy - FLD, resource breeze - continue to mobilize as tolerated - IR drain on 11/30 for abscess, cx pending reincubated for better growth yesterday  - may be getting close to discharge in the next 1-2 days   ID -[id number]>> VTE -SCDs, lovenox  FEN -SLIV, FLD, resource breeze Foley -removed 11/24 Follow up -Dr. Johney Maine  LOS: 9 days    Norm Parcel , [facility] Surgery 10/07/2020, 11:25 AM Please see Amion for pager number during day hours 7:00am-4:30pm

## 2020-10-07 NOTE — Progress Notes (Signed)
Referring Physician(s): Martin,M  Supervising Physician: Jacqulynn Cadet  Patient Status:  [facility] - In-pt  Chief Complaint: Abdominal pain/left upper abdominal abscess    Subjective: Pt doing ok this pm; no N/V; did have some abd pain last night, now resolved   Allergies: Fosamax [alendronate]  Medications: Prior to Admission medications   Medication Sig Start Date End Date Taking? Authorizing Provider  aspirin 81 MG chewable tablet Chew 81 mg by mouth daily.   Yes [provider]  Biotin 1000 MCG tablet Take 1,000 mcg by mouth daily.   Yes [provider]  Calcium Carb-Cholecalciferol (CALCIUM 1000 + D) 1000-800 MG-UNIT TABS Take 1 tablet by mouth daily.   Yes [provider]  Multiple Vitamins-Minerals (CENTRUM SILVER) tablet Take 1 tablet by mouth daily.   Yes [provider]  MYRBETRIQ 50 MG TB24 tablet Take 50 mg by mouth daily. 09/23/20  Yes [provider]  rosuvastatin (CRESTOR) 10 MG tablet Take 10 mg by mouth daily. 08/22/20  Yes [provider]  VITAMIN D PO Take 1 tablet by mouth daily.   Yes [provider]     Vital Signs: BP 140/70 (BP Location: Left Arm)   Pulse 84   Temp 97.9 F (36.6 C)   Resp 17   Ht 5\' 6"  (1.676 m)   Wt 156 lb 12 oz (71.1 kg)   SpO2 90%   BMI 25.30 kg/m   Physical Exam awake/alert; left abd drain intact, dressing dry, not sig tender, OP 70 cc turbid, beige colored fluid  Imaging: CT ABDOMEN PELVIS W CONTRAST  Result Date: 10/04/2020 CLINICAL DATA:  Status post appendectomy on 09/28/2020. Abdominal infection suspected. EXAM: CT ABDOMEN AND PELVIS WITH CONTRAST TECHNIQUE: Multidetector CT imaging of the abdomen and pelvis was performed using the standard protocol following bolus administration of intravenous contrast. CONTRAST:  173mL OMNIPAQUE IOHEXOL 300 MG/ML  SOLN COMPARISON:  09/28/2020 FINDINGS: Lower chest: Moderate bilateral pleural effusions with associated  dependent compressive atelectasis. Hepatobiliary: Small irregular area of hypoattenuation along the inferior margin of the right liver lobe, not evident on the prior exam. This is suspicious for a small early abscess. It measures approximately 1.4 cm in greatest dimension. No other liver abnormality. There are dependent gallstones. There is gallbladder wall thickening and/or pericholecystic fluid, presumed reactive to the adjacent inflammatory changes. No bile duct dilation. Pancreas: No mass. Normal enhancement. There is adjacent fluid attenuation, but no convincing pancreatitis. Spleen: Normal in size without focal abnormality. Adrenals/Urinary Tract: No adrenal masses. Kidneys normal in size, orientation and position with symmetric enhancement and excretion. 2 cm anterior lower pole right renal cyst. Adjacent tiny lesion has also likely a cyst. No other renal masses, no stones and no hydronephrosis. Normal ureters. Bladder minimally distended otherwise unremarkable. Stomach/Bowel/Peritoneal cavity: Multiple peritoneal collections. Largest collection is in the left quadrant left mid abdomen, causing a concave indentation along the greater curvature of the stomach. This collection measures 18 x 6.4 x 7.7 cm. There are few non dependent bubbles of air along its superior margin. Another collection lies abutting the posteromedial liver, which may be intrahepatic along the base of the caudate lobe, measuring 4.4 x 4.5 x 4.4 cm. A third collection lies along the right pericolic gutter measuring approximately 7.2 x 3.1 x 3.8 cm. A small amount of subcapsular fluid extends along the inferior margin of the liver. Trace free fluid is noted in the pelvis. There are additional areas hazy and reticular opacity consistent inflammation/edema. Stomach is mostly  decompressed, compressed by the left upper quadrant collection, otherwise unremarkable. Small bowel and colon are normal in caliber. Mild wall thickening noted of the  ileum, presumed reactive to adjacent retrocecal inflammation. There are numerous colonic diverticula, most evident along the sigmoid, without evidence of diverticulitis. No free intraperitoneal air or retroperitoneal air. Vascular/Lymphatic: Dense aortic atherosclerosis. No aneurysm. No venous thrombosis. Enlarged left inguinal lymph node, 1.7 cm in short axis. No other enlarged lymph nodes. Reproductive: Postmenopausal uterus with associated calcifications consistent small fibroids. No adnexal masses. Other: Deep subcutaneous soft tissue air no along the right anterolateral abdomen. Trocar insertion sites evident in the left lower quadrant and left mid abdomen. Subcutaneous edema is noted along the lower abdomen and pelvis laterally. Musculoskeletal: No fracture or acute finding.  No bone lesion. IMPRESSION: 1. Interval worsening of peritoneal fluid collections consistent with abscesses. Largest collection lies the left upper quadrant, abutting the greater curvature of the stomach, significantly increased in size from the prior CT, now 18 cm in long axis. There is another collection that lies adjacent to, possibly extending into, the posteromedial liver adjacent to the caudate lobe, 4.5 cm in greatest dimension, also increased. Right pericolic gutter collection is also mildly increased in size, but is better defined than on the prior exam. 2. No free intraperitoneal air. 3. There are changes consistent with the interval laparoscopic appendectomy perform since the prior CT. Changes include deep subcutaneous soft tissue air. Electronically Signed   By: Lajean Manes M.D.   On: 10/04/2020 16:42   CT IMAGE GUIDED DRAINAGE BY PERCUTANEOUS CATHETER  Result Date: 10/05/2020 INDICATION: 79 year old female with left upper quadrant fluid collection referred for drainage EXAM: CT GUIDED DRAINAGE OF  ABSCESS MEDICATIONS: The patient is currently admitted to the hospital and receiving intravenous antibiotics. The  antibiotics were administered within an appropriate time frame prior to the initiation of the procedure. ANESTHESIA/SEDATION: 2.0 mg IV Versed 100 mcg IV Fentanyl Moderate Sedation Time:  16 minutes The patient was continuously monitored during the procedure by the interventional radiology nurse under my direct supervision. COMPLICATIONS: None TECHNIQUE: Informed written consent was obtained from the patient after a thorough discussion of the procedural risks, benefits and alternatives. All questions were addressed. Maximal Sterile Barrier Technique was utilized including caps, mask, sterile gowns, sterile gloves, sterile drape, hand hygiene and skin antiseptic. A timeout was performed prior to the initiation of the procedure. PROCEDURE: The operative field was prepped with Chlorhexidine in a sterile fashion, and a sterile drape was applied covering the operative field. A sterile gown and sterile gloves were used for the procedure. Local anesthesia was provided with 1% Lidocaine. Scout CT was acquired. The patient is prepped and draped in the usual sterile fashion. 1% lidocaine was used for local anesthesia. Trocar needle was advanced into the fluid collection of the left abdomen. Once we confirmed needle tip position, wire was advanced and then modified Seldinger technique was used to place a 12 [place] drain into the fluid collection. Approximately 280 cc of murky yellow fluid was aspirated. Culture was sent. Catheter was sutured in position and attached to bulb suction. Final CT was acquired. Patient tolerated the procedure well and remained hemodynamically stable throughout. No complications were encountered and no significant blood loss. FINDINGS: Initial CT image demonstrates fluid collection in the left upper quadrant with internal gas. Final CT demonstrates relative improved size of the fluid with a 12 [place] drain in position. IMPRESSION: Status post CT-guided drainage of left upper quadrant fluid  collection. Signed,  Dulcy Fanny. Dellia Nims, RPVI [facility] Radiology Electronically Signed   By: Corrie Mckusick D.O.   On: 10/05/2020 17:27    Labs:  CBC: Recent Labs    10/04/20 0233 10/05/20 0355 10/06/20 0356 10/07/20 0333  WBC 20.3* 16.7* 13.3* 12.3*  HGB 10.1* 10.4* 10.3* 10.1*  HCT 31.8* 32.1* 32.4* 31.4*  PLT 366 428* 474* 478*    COAGS: No results for input(s): INR, APTT in the last 8760 hours.  BMP: Recent Labs    10/03/20 0421 10/04/20 0233 10/05/20 0355 10/06/20 0356  NA 140 139 140 138  K 3.3* 4.4 3.7 3.7  CL 103 101 101 101  CO2 26 29 26 23   GLUCOSE 94 99 80 89  BUN 16 14 9  7*  CALCIUM 8.0* 8.0* 8.1* 8.0*  CREATININE 0.56 0.54 0.57 0.52  GFRNONAA >60 >60 >60 >60    LIVER FUNCTION TESTS: Recent Labs    10/02/20 0249 10/03/20 0421 10/04/20 0233 10/05/20 0355  BILITOT 0.9 1.2 1.0 1.3*  AST 31 27 22 23   ALT 33 25 19 18   ALKPHOS 114 97 85 100  PROT 5.6* 5.6* 5.5* 5.7*  ALBUMIN 2.0* 2.0* 1.9* 2.0*    Assessment and Plan: Patient with history of gangrenous perforated appendicitis with associated intra-abdominal /retroperitoneal abscesses/feculent peritonitis, status post laparoscopic drainage of abscess x2, appendectomy ,laparoscopy with abdominal washout on 11/23; status post drainage of left upper quadrant fluid collection on 11/30; afebrile, WBC 12.3(13.3), hgb stable; drain fluid cx- mod strept anginosis, rare gm neg rods; continue with drain irrigation and output monitoring; once output minimal over 2 to 3-day span obtain follow-up CT; may need drain injection prior to removal.   Electronically Signed: D. Rowe Robert, PA-C 10/07/2020, 3:02 PM   I spent a total of 15 minutes at the the patient's bedside AND on the patient's hospital floor or unit, greater than 50% of which was counseling/coordinating care for left abdominal abscess drain    Patient ID: Molly Manning, female   DOB: 03-29-1941,  79 y.o.   MRN: [id number]

## 2020-10-07 NOTE — Progress Notes (Signed)
Patient ID: Molly Manning, female   DOB: 05-08-1941, 79 y.o.   MRN: [id number]  PROGRESS NOTE    Molly Manning  QQI:[id number] DOB: 14-Feb-1941 DOA: 09/28/2020 PCP: Deland Pretty, MD   Brief Narrative:  79 y.o.femalewith medical history significant ofHLD, overactive bladder presented on 09/28/2020 with abdominal pain and was found to have perforated appendicitis on CT scan.  She was admitted under general surgery service and she underwent laparoscopic appendectomy with drainage of abscess and abdominal washout by general surgery.  [facility] was consulted for medical management.  Assessment & Plan:   Sepsis: Present on admission Gangrenous perforated appendicitis Intra-abdominal and retroperitoneal abscess Feculent peritonitis -Status post laparoscopic drainage of abscess x2, laparoscopic appendectomy, diagnostic laparoscopy with abdominal washout on 09/28/2020 -Care as per primary team: NG tube removed on 10/02/2020.  Diet advancement as per general surgery. -CT of the abdomen and pelvis showed worsening peritoneal fluid collections consistent with abscesses: Status post IR guided drain placement on 10/05/2020: Cultures negative so far -Currently on Zosyn and Eraxis as per primary surgical team -Sepsis has currently resolved.  Acute hypoxic respiratory failure Community-acquired pneumonia -Chest x-ray from 09/28/2020 showed patchy airspace opacities within the left midlung and lung base concerning for pneumonia.  Repeat chest x-ray on 09/30/2020 showed persistent small effusions with decreased aerations of both lower lung zones - bandlike atelectasis within L midlung -Respiratory status has improved: Currently on room air -Continue incentive spirometry.  Has been on Zosyn for a while and does not need any more antibiotics for her respiratory condition  Leukocytosis -Improving  RUE Edema - RUE [place] negative for DVT.  Most likely secondary to IV infiltration  Elevated LFTs -  resolved  Normocytic Anemia   Iron Def Anemia       - likely dilutional, possible component of blood loss from surgery, continue to monitor  - labs c/w iron deficiency anemia: Plan for PO iron when able.  Hemoglobin stable for now  Thrombocytosis -Probably reactive.  Hypertension: she's not on home BP meds.  Blood pressure still on the higher side.  Continue amlodipine.  Might have to increase the dose if blood pressure still remains an issue  Hyperlipidemia -Continue Crestor  Overactive bladder -Continue myrbetriq  Subjective: Patient seen and examined at bedside.  Denies worsening shortness of breath, fever, vomiting.  Had more abdominal pain overnight and did not sleep well overnight. Objective: Vitals:   10/06/20 2211 10/06/20 2228 10/07/20 0500 10/07/20 0529  BP: (!) 170/69 (!) 156/71  (!) 155/74  Pulse: 92   100  Resp: 17   16  Temp: 98.1 F (36.7 C)   98.2 F (36.8 C)  TempSrc: Oral   Oral  SpO2: 94%   94%  Weight:   71.1 kg   Height:        Intake/Output Summary (Last 24 hours) at 10/07/2020 0753 Last data filed at 10/07/2020 0700 Gross per 24 hour  Intake 951.42 ml  Output 70 ml  Net 881.42 ml   Filed Weights   10/05/20 0621 10/06/20 0624 10/07/20 0500  Weight: 77.8 kg 71.8 kg 71.1 kg    Examination:  General exam: Chronically ill looking female lying in bed.  No acute distress. Respiratory system: Decreased breath sounds at bases with some scattered crackles, no wheezing  cardiovascular system: Rate controlled, S1-S2 heard Gastrointestinal system: Abdomen is slightly distended, soft and mildly tender around the left lower quadrant with dressing and drain present.  Sluggish bowel sounds Extremities: Mild lower extremity edema present.  No  clubbing    Data Reviewed: I have personally reviewed following labs and imaging studies  CBC: Recent Labs  Lab 10/01/20 0347 10/01/20 0347 10/02/20 0249 10/02/20 0249 10/03/20 0421 10/04/20 0233  10/05/20 0355 10/06/20 0356 10/07/20 0333  WBC 20.9*   < > 19.0*   < > 17.5* 20.3* 16.7* 13.3* 12.3*  NEUTROABS 17.8*  --  15.3*  --  13.5* 16.2* 13.8*  --   --   HGB 10.5*   < > 11.0*   < > 10.9* 10.1* 10.4* 10.3* 10.1*  HCT 31.1*   < > 34.0*   < > 32.9* 31.8* 32.1* 32.4* 31.4*  MCV 91.7   < > 94.4   < > 91.6 95.8 94.7 96.7 94.6  PLT 282   < > 345   < > 369 366 428* 474* 478*   < > = values in this interval not displayed.   Basic Metabolic Panel: Recent Labs  Lab 10/01/20 0347 10/01/20 0347 10/02/20 0249 10/03/20 0421 10/04/20 0233 10/05/20 0355 10/06/20 0356  NA 141   < > 143 140 139 140 138  K 3.6   < > 3.3* 3.3* 4.4 3.7 3.7  CL 108   < > 105 103 101 101 101  CO2 22   < > 26 26 29 26 23   GLUCOSE 72   < > 84 94 99 80 89  BUN 33*   < > 22 16 14 9  7*  CREATININE 0.75   < > 0.64 0.56 0.54 0.57 0.52  CALCIUM 8.1*   < > 8.0* 8.0* 8.0* 8.1* 8.0*  MG 2.1  --  1.8 1.8 2.1 1.8  --   PHOS 2.8  --  3.2 3.1 3.1 2.9  --    < > = values in this interval not displayed.   GFR: Estimated Creatinine Clearance: 54.3 mL/min (by C-G formula based on SCr of 0.52 mg/dL). Liver Function Tests: Recent Labs  Lab 10/01/20 0347 10/02/20 0249 10/03/20 0421 10/04/20 0233 10/05/20 0355  AST 57* 31 27 22 23   ALT 48* 33 25 19 18   ALKPHOS 100 114 97 85 100  BILITOT 0.9 0.9 1.2 1.0 1.3*  PROT 5.4* 5.6* 5.6* 5.5* 5.7*  ALBUMIN 1.9* 2.0* 2.0* 1.9* 2.0*   No results for input(s): LIPASE, AMYLASE in the last 168 hours. No results for input(s): AMMONIA in the last 168 hours. Coagulation Profile: No results for input(s): INR, PROTIME in the last 168 hours. Cardiac Enzymes: No results for input(s): CKTOTAL, CKMB, CKMBINDEX, TROPONINI in the last 168 hours. BNP (last 3 results) No results for input(s): PROBNP in the last 8760 hours. HbA1C: No results for input(s): HGBA1C in the last 72 hours. CBG: No results for input(s): GLUCAP in the last 168 hours. Lipid Profile: No results for input(s):  CHOL, HDL, LDLCALC, TRIG, CHOLHDL, LDLDIRECT in the last 72 hours. Thyroid Function Tests: No results for input(s): TSH, T4TOTAL, FREET4, T3FREE, THYROIDAB in the last 72 hours. Anemia Panel: No results for input(s): VITAMINB12, FOLATE, FERRITIN, TIBC, IRON, RETICCTPCT in the last 72 hours. Sepsis Labs: No results for input(s): PROCALCITON, LATICACIDVEN in the last 168 hours.  Recent Results (from the past 240 hour(s))  Resp Panel by RT-PCR (Flu A&B, Covid) Nasopharyngeal Swab     Status: None   Collection Time: 09/28/20  9:54 AM   Specimen: Nasopharyngeal Swab; Nasopharyngeal(NP) swabs in vial transport medium  Result Value Ref Range Status   SARS Coronavirus 2 by RT PCR NEGATIVE NEGATIVE Final  Comment: (NOTE) SARS-CoV-2 target nucleic acids are NOT DETECTED.  The SARS-CoV-2 RNA is generally detectable in upper respiratory specimens during the acute phase of infection. The lowest concentration of SARS-CoV-2 viral copies this assay can detect is 138 copies/mL. A negative result does not preclude SARS-Cov-2 infection and should not be used as the sole basis for treatment or other patient management decisions. A negative result may occur with  improper specimen collection/handling, submission of specimen other than nasopharyngeal swab, presence of viral mutation(s) within the areas targeted by this assay, and inadequate number of viral copies(<138 copies/mL). A negative result must be combined with clinical observations, patient history, and epidemiological information. The expected result is Negative.  Fact Sheet for Patients:  EntrepreneurPulse.com.au  Fact Sheet for Healthcare Providers:  IncredibleEmployment.be  This test is no t yet approved or cleared by the [place] FDA and  has been authorized for detection and/or diagnosis of SARS-CoV-2 by FDA under an Emergency Use Authorization (EUA). This EUA will remain  in effect (meaning  this test can be used) for the duration of the COVID-19 declaration under Section 564(b)(1) of the Act, 21 U.S.C.section 360bbb-3(b)(1), unless the authorization is terminated  or revoked sooner.       Influenza A by PCR NEGATIVE NEGATIVE Final   Influenza B by PCR NEGATIVE NEGATIVE Final    Comment: (NOTE) The Xpert Xpress SARS-CoV-2/FLU/RSV plus assay is intended as an aid in the diagnosis of influenza from Nasopharyngeal swab specimens and should not be used as a sole basis for treatment. Nasal washings and aspirates are unacceptable for Xpert Xpress SARS-CoV-2/FLU/RSV testing.  Fact Sheet for Patients: EntrepreneurPulse.com.au  Fact Sheet for Healthcare Providers: IncredibleEmployment.be  This test is not yet approved or cleared by the [place] FDA and has been authorized for detection and/or diagnosis of SARS-CoV-2 by FDA under an Emergency Use Authorization (EUA). This EUA will remain in effect (meaning this test can be used) for the duration of the COVID-19 declaration under Section 564(b)(1) of the Act, 21 U.S.C. section 360bbb-3(b)(1), unless the authorization is terminated or revoked.  Performed at [facility], [place] [address], [place], [place] [address]   MRSA PCR Screening     Status: None   Collection Time: 09/28/20  8:15 PM   Specimen: Nasopharyngeal Wash  Result Value Ref Range Status   MRSA by PCR NEGATIVE NEGATIVE Final    Comment:        The GeneXpert MRSA Assay (FDA approved for NASAL specimens only), is one component of a comprehensive MRSA colonization surveillance program. It is not intended to diagnose MRSA infection nor to guide or monitor treatment for MRSA infections. Performed at [facility], [place] [address] [place], [place] [address]   Aerobic/Anaerobic Culture (surgical/deep wound)     Status: None (Preliminary result)   Collection Time: 10/05/20  4:03 PM    Specimen: Abscess  Result Value Ref Range Status   Specimen Description   Final    ABSCESS ABDOMEN Performed at [place] [address] [place], [place] [address]    Special Requests   Final    NONE Performed at [facility], [place] [address], [place], [place] [address]    Gram Stain   Final    ABUNDANT WBC PRESENT,BOTH PMN AND MONONUCLEAR NO ORGANISMS SEEN    Culture   Final    CULTURE REINCUBATED FOR BETTER GROWTH Performed at [facility], [place] [address], [place], [place] [address]  Report Status PENDING  Incomplete         Radiology Studies: CT IMAGE GUIDED DRAINAGE BY PERCUTANEOUS CATHETER  Result Date: 10/05/2020 INDICATION: 79 year old female with left upper quadrant fluid collection referred for drainage EXAM: CT GUIDED DRAINAGE OF  ABSCESS MEDICATIONS: The patient is currently admitted to the hospital and receiving intravenous antibiotics. The antibiotics were administered within an appropriate time frame prior to the initiation of the procedure. ANESTHESIA/SEDATION: 2.0 mg IV Versed 100 mcg IV Fentanyl Moderate Sedation Time:  16 minutes The patient was continuously monitored during the procedure by the interventional radiology nurse under my direct supervision. COMPLICATIONS: None TECHNIQUE: Informed written consent was obtained from the patient after a thorough discussion of the procedural risks, benefits and alternatives. All questions were addressed. Maximal Sterile Barrier Technique was utilized including caps, mask, sterile gowns, sterile gloves, sterile drape, hand hygiene and skin antiseptic. A timeout was performed prior to the initiation of the procedure. PROCEDURE: The operative field was prepped with Chlorhexidine in a sterile fashion, and a sterile drape was applied covering the operative field. A sterile gown and sterile gloves were used for the procedure. Local anesthesia was provided with 1% Lidocaine. Scout CT  was acquired. The patient is prepped and draped in the usual sterile fashion. 1% lidocaine was used for local anesthesia. Trocar needle was advanced into the fluid collection of the left abdomen. Once we confirmed needle tip position, wire was advanced and then modified Seldinger technique was used to place a 12 [place] drain into the fluid collection. Approximately 280 cc of murky yellow fluid was aspirated. Culture was sent. Catheter was sutured in position and attached to bulb suction. Final CT was acquired. Patient tolerated the procedure well and remained hemodynamically stable throughout. No complications were encountered and no significant blood loss. FINDINGS: Initial CT image demonstrates fluid collection in the left upper quadrant with internal gas. Final CT demonstrates relative improved size of the fluid with a 12 [place] drain in position. IMPRESSION: Status post CT-guided drainage of left upper quadrant fluid collection. Signed, Dulcy Fanny. Dellia Nims, RPVI [facility] Radiology Electronically Signed   By: Corrie Mckusick D.O.   On: 10/05/2020 17:27        Scheduled Meds:  acetaminophen  1,000 mg Oral TID   amLODipine  5 mg Oral Daily   aspirin  81 mg Oral Daily   Chlorhexidine Gluconate Cloth  6 each Topical Daily   enoxaparin (LOVENOX) injection  40 mg Subcutaneous Q24H   feeding supplement  1 Container Oral TID BM   lip balm  1 application Topical BID   mouth rinse  15 mL Mouth Rinse BID   mirabegron ER  50 mg Oral Daily   multivitamin with minerals  1 tablet Oral Daily   pantoprazole (PROTONIX) IV  40 mg Intravenous QHS   psyllium  1 packet Oral BID   rosuvastatin  10 mg Oral Daily   sodium chloride flush  3 mL Intravenous Q12H   sodium chloride flush  5 mL Intracatheter Q8H   Continuous Infusions:  sodium chloride Stopped (10/04/20 0513)   sodium chloride     anidulafungin 100 mg (10/06/20 1054)    piperacillin-tazobactam (ZOSYN)  IV 3.375 g (10/07/20 3536)          Aline August, MD [facility] 10/07/2020, 7:53 AM

## 2020-10-07 NOTE — Plan of Care (Signed)
  Problem: Pain Managment: Goal: General experience of comfort will improve Outcome: Progressing   

## 2020-10-08 DIAGNOSIS — I10 Essential (primary) hypertension: Secondary | ICD-10-CM

## 2020-10-08 LAB — CBC
HCT: 31.2 % — ABNORMAL LOW (ref 36.0–46.0)
Hemoglobin: 10 g/dL — ABNORMAL LOW (ref 12.0–15.0)
MCH: 30.8 pg (ref 26.0–34.0)
MCHC: 32.1 g/dL (ref 30.0–36.0)
MCV: 96 fL (ref 80.0–100.0)
Platelets: 476 10*3/uL — ABNORMAL HIGH (ref 150–400)
RBC: 3.25 MIL/uL — ABNORMAL LOW (ref 3.87–5.11)
RDW: 14.3 % (ref 11.5–15.5)
WBC: 11.9 10*3/uL — ABNORMAL HIGH (ref 4.0–10.5)
nRBC: 0 % (ref 0.0–0.2)

## 2020-10-08 MED ORDER — PANTOPRAZOLE SODIUM 40 MG PO TBEC
40.0000 mg | DELAYED_RELEASE_TABLET | Freq: Every day | ORAL | Status: DC
  Administered 2020-10-08 – 2020-10-11 (×4): 40 mg via ORAL
  Filled 2020-10-08 (×4): qty 1

## 2020-10-08 MED ORDER — AMOXICILLIN-POT CLAVULANATE 875-125 MG PO TABS
1.0000 | ORAL_TABLET | Freq: Two times a day (BID) | ORAL | Status: DC
  Administered 2020-10-08: 1 via ORAL
  Filled 2020-10-08: qty 1

## 2020-10-08 MED ORDER — CEPHALEXIN 500 MG PO CAPS
500.0000 mg | ORAL_CAPSULE | Freq: Three times a day (TID) | ORAL | Status: DC
  Administered 2020-10-09 – 2020-10-12 (×11): 500 mg via ORAL
  Filled 2020-10-08 (×11): qty 1

## 2020-10-08 NOTE — Progress Notes (Signed)
Patient ID: Molly Manning, female   DOB: Jul 26, 1941, 79 y.o.   MRN: [id number]  PROGRESS NOTE    Molly Manning  HYQ:[id number] DOB: 1941/01/29 DOA: 09/28/2020 PCP: Deland Pretty, MD   Brief Narrative:  79 y.o.femalewith medical history significant ofHLD, overactive bladder presented on 09/28/2020 with abdominal pain and was found to have perforated appendicitis on CT scan.  She was admitted under general surgery service and she underwent laparoscopic appendectomy with drainage of abscess and abdominal washout by general surgery.  [facility] was consulted for medical management.  Assessment & Plan:   Sepsis: Present on admission Gangrenous perforated appendicitis Intra-abdominal and retroperitoneal abscess Feculent peritonitis -Status post laparoscopic drainage of abscess x2, laparoscopic appendectomy, diagnostic laparoscopy with abdominal washout on 09/28/2020 -Care as per primary team: NG tube removed on 10/02/2020.  Diet advancement as per general surgery. -CT of the abdomen and pelvis showed worsening peritoneal fluid collections consistent with abscesses: Status post IR guided drain placement on 10/05/2020: Cultures growing moderate Streptococcus anginosis and rare gram-negative rods -Currently on Zosyn and Eraxis as per primary surgical team -Sepsis has currently resolved.  Acute hypoxic respiratory failure Community-acquired pneumonia -Chest x-ray from 09/28/2020 showed patchy airspace opacities within the left midlung and lung base concerning for pneumonia.  Repeat chest x-ray on 09/30/2020 showed persistent small effusions with decreased aerations of both lower lung zones - bandlike atelectasis within L midlung -Respiratory status has improved: Currently on room air -Continue incentive spirometry.  Has been on Zosyn for a while and does not need any more antibiotics for her respiratory condition  Leukocytosis -Improving  RUE Edema - RUE [place] negative for DVT.  Most likely secondary  to IV infiltration  Elevated LFTs - resolved  Normocytic Anemia  Iron Def Anemia       - likely dilutional, possible component of blood loss from surgery, continue to monitor  - labs c/w iron deficiency anemia: Plan for PO iron when able.  Hemoglobin stable for now  Thrombocytosis -Probably reactive.  Hypertension: she's not on home BP meds.  Blood pressure still on the higher side.  Continue amlodipine.  Might have to increase the dose if blood pressure still remains an issue  Hyperlipidemia -Continue Crestor  Overactive bladder -Continue myrbetriq  Subjective: Patient seen and examined at bedside.  No overnight fever, vomiting or worsening shortness of breath reported.  Feels better and ambulating better.  Denies worsening abdominal pain. Objective: Vitals:   10/07/20 0529 10/07/20 1334 10/07/20 2150 10/08/20 0510  BP: (!) 155/74 140/70 (!) 144/65 (!) 156/73  Pulse: 100 84 87 96  Resp: 16 17 16 16   Temp: 98.2 F (36.8 C) 97.9 F (36.6 C) 98.2 F (36.8 C) 98 F (36.7 C)  TempSrc: Oral     SpO2: 94% 90% 93% 93%  Weight:      Height:        Intake/Output Summary (Last 24 hours) at 10/08/2020 0749 Last data filed at 10/08/2020 0636 Gross per 24 hour  Intake 1523.56 ml  Output 75 ml  Net 1448.56 ml   Filed Weights   10/05/20 0621 10/06/20 0624 10/07/20 0500  Weight: 77.8 kg 71.8 kg 71.1 kg    Examination:  General exam: No distress.  Chronically ill looking female lying in bed. Respiratory system: Bilateral decreased breath sounds at bases with scattered crackles cardiovascular system: S1-S2 heard, rate controlled  gastrointestinal system: Abdomen is mildly distended, soft and tender mildly around the left lower quadrant with dressing and drain present.  Bowel sounds are  sluggish  extremities: No cyanosis.  Trace lower extremity edema present   Data Reviewed: I have personally reviewed following labs and imaging studies  CBC: Recent Labs  Lab  10/02/20 0249 10/02/20 0249 10/03/20 0421 10/03/20 0421 10/04/20 0233 10/05/20 0355 10/06/20 0356 10/07/20 0333 10/08/20 0321  WBC 19.0*   < > 17.5*   < > 20.3* 16.7* 13.3* 12.3* 11.9*  NEUTROABS 15.3*  --  13.5*  --  16.2* 13.8*  --   --   --   HGB 11.0*   < > 10.9*   < > 10.1* 10.4* 10.3* 10.1* 10.0*  HCT 34.0*   < > 32.9*   < > 31.8* 32.1* 32.4* 31.4* 31.2*  MCV 94.4   < > 91.6   < > 95.8 94.7 96.7 94.6 96.0  PLT 345   < > 369   < > 366 428* 474* 478* 476*   < > = values in this interval not displayed.   Basic Metabolic Panel: Recent Labs  Lab 10/02/20 0249 10/03/20 0421 10/04/20 0233 10/05/20 0355 10/06/20 0356  NA 143 140 139 140 138  K 3.3* 3.3* 4.4 3.7 3.7  CL 105 103 101 101 101  CO2 26 26 29 26 23   GLUCOSE 84 94 99 80 89  BUN 22 16 14 9  7*  CREATININE 0.64 0.56 0.54 0.57 0.52  CALCIUM 8.0* 8.0* 8.0* 8.1* 8.0*  MG 1.8 1.8 2.1 1.8  --   PHOS 3.2 3.1 3.1 2.9  --    GFR: Estimated Creatinine Clearance: 54.3 mL/min (by C-G formula based on SCr of 0.52 mg/dL). Liver Function Tests: Recent Labs  Lab 10/02/20 0249 10/03/20 0421 10/04/20 0233 10/05/20 0355  AST 31 27 22 23   ALT 33 25 19 18   ALKPHOS 114 97 85 100  BILITOT 0.9 1.2 1.0 1.3*  PROT 5.6* 5.6* 5.5* 5.7*  ALBUMIN 2.0* 2.0* 1.9* 2.0*   No results for input(s): LIPASE, AMYLASE in the last 168 hours. No results for input(s): AMMONIA in the last 168 hours. Coagulation Profile: No results for input(s): INR, PROTIME in the last 168 hours. Cardiac Enzymes: No results for input(s): CKTOTAL, CKMB, CKMBINDEX, TROPONINI in the last 168 hours. BNP (last 3 results) No results for input(s): PROBNP in the last 8760 hours. HbA1C: No results for input(s): HGBA1C in the last 72 hours. CBG: No results for input(s): GLUCAP in the last 168 hours. Lipid Profile: No results for input(s): CHOL, HDL, LDLCALC, TRIG, CHOLHDL, LDLDIRECT in the last 72 hours. Thyroid Function Tests: No results for input(s): TSH,  T4TOTAL, FREET4, T3FREE, THYROIDAB in the last 72 hours. Anemia Panel: No results for input(s): VITAMINB12, FOLATE, FERRITIN, TIBC, IRON, RETICCTPCT in the last 72 hours. Sepsis Labs: No results for input(s): PROCALCITON, LATICACIDVEN in the last 168 hours.  Recent Results (from the past 240 hour(s))  Resp Panel by RT-PCR (Flu A&B, Covid) Nasopharyngeal Swab     Status: None   Collection Time: 09/28/20  9:54 AM   Specimen: Nasopharyngeal Swab; Nasopharyngeal(NP) swabs in vial transport medium  Result Value Ref Range Status   SARS Coronavirus 2 by RT PCR NEGATIVE NEGATIVE Final    Comment: (NOTE) SARS-CoV-2 target nucleic acids are NOT DETECTED.  The SARS-CoV-2 RNA is generally detectable in upper respiratory specimens during the acute phase of infection. The lowest concentration of SARS-CoV-2 viral copies this assay can detect is 138 copies/mL. A negative result does not preclude SARS-Cov-2 infection and should not be used as the sole basis for  treatment or other patient management decisions. A negative result may occur with  improper specimen collection/handling, submission of specimen other than nasopharyngeal swab, presence of viral mutation(s) within the areas targeted by this assay, and inadequate number of viral copies(<138 copies/mL). A negative result must be combined with clinical observations, patient history, and epidemiological information. The expected result is Negative.  Fact Sheet for Patients:  EntrepreneurPulse.com.au  Fact Sheet for Healthcare Providers:  IncredibleEmployment.be  This test is no t yet approved or cleared by the [place] FDA and  has been authorized for detection and/or diagnosis of SARS-CoV-2 by FDA under an Emergency Use Authorization (EUA). This EUA will remain  in effect (meaning this test can be used) for the duration of the COVID-19 declaration under Section 564(b)(1) of the Act, 21 U.S.C.section  360bbb-3(b)(1), unless the authorization is terminated  or revoked sooner.       Influenza A by PCR NEGATIVE NEGATIVE Final   Influenza B by PCR NEGATIVE NEGATIVE Final    Comment: (NOTE) The Xpert Xpress SARS-CoV-2/FLU/RSV plus assay is intended as an aid in the diagnosis of influenza from Nasopharyngeal swab specimens and should not be used as a sole basis for treatment. Nasal washings and aspirates are unacceptable for Xpert Xpress SARS-CoV-2/FLU/RSV testing.  Fact Sheet for Patients: EntrepreneurPulse.com.au  Fact Sheet for Healthcare Providers: IncredibleEmployment.be  This test is not yet approved or cleared by the [place] FDA and has been authorized for detection and/or diagnosis of SARS-CoV-2 by FDA under an Emergency Use Authorization (EUA). This EUA will remain in effect (meaning this test can be used) for the duration of the COVID-19 declaration under Section 564(b)(1) of the Act, 21 U.S.C. section 360bbb-3(b)(1), unless the authorization is terminated or revoked.  Performed at [facility], [place] [address], [place], [place] [address]   MRSA PCR Screening     Status: None   Collection Time: 09/28/20  8:15 PM   Specimen: Nasopharyngeal Wash  Result Value Ref Range Status   MRSA by PCR NEGATIVE NEGATIVE Final    Comment:        The GeneXpert MRSA Assay (FDA approved for NASAL specimens only), is one component of a comprehensive MRSA colonization surveillance program. It is not intended to diagnose MRSA infection nor to guide or monitor treatment for MRSA infections. Performed at [facility], [place] [address], [place], [place] [address]   Aerobic/Anaerobic Culture (surgical/deep wound)     Status: None (Preliminary result)   Collection Time: 10/05/20  4:03 PM   Specimen: Abscess  Result Value Ref Range Status   Specimen Description   Final    ABSCESS ABDOMEN Performed at [place] [address] [place], [place] [address]    Special Requests   Final    NONE Performed at [facility] [place] [address] [place], [place] [address]    Gram Stain   Final    ABUNDANT WBC PRESENT,BOTH PMN AND MONONUCLEAR NO ORGANISMS SEEN    Culture   Final    MODERATE STREPTOCOCCUS ANGINOSIS RARE GRAM NEGATIVE RODS IDENTIFICATION AND SUSCEPTIBILITIES TO FOLLOW Performed at [facility], [place] [address] [place],  [address]    Report Status PENDING  Incomplete         Radiology Studies: No results found.      Scheduled Meds: . acetaminophen  1,000 mg Oral TID  . amLODipine  5 mg Oral Daily  . aspirin  81 mg Oral Daily  .  Chlorhexidine Gluconate Cloth  6 each Topical Daily  . enoxaparin (LOVENOX) injection  40 mg Subcutaneous Q24H  . feeding supplement  1 Container Oral TID BM  . lip balm  1 application Topical BID  . mouth rinse  15 mL Mouth Rinse BID  . mirabegron ER  50 mg Oral Daily  . multivitamin with minerals  1 tablet Oral Daily  . pantoprazole (PROTONIX) IV  40 mg Intravenous QHS  . psyllium  1 packet Oral BID  . rosuvastatin  10 mg Oral Daily  . sodium chloride flush  3 mL Intravenous Q12H  . sodium chloride flush  5 mL Intracatheter Q8H   Continuous Infusions: . sodium chloride Stopped (10/04/20 0513)  . sodium chloride    . piperacillin-tazobactam (ZOSYN)  IV 3.375 g (10/08/20 0536)          Aline August, MD [facility] 10/08/2020, 7:49 AM

## 2020-10-08 NOTE — Progress Notes (Signed)
10 Days Post-Op  Subjective: Patient has some slight nausea with full liquids yesterday, but is really wanting to try some solid food.  Moving her bowels well.    ROS: See above, otherwise other systems negative  Objective: Vital signs in last 24 hours: Temp:  [97.9 F (36.6 C)-98.2 F (36.8 C)] 98 F (36.7 C) (12/03 0510) Pulse Rate:  [84-96] 96 (12/03 0510) Resp:  [16-17] 16 (12/03 0510) BP: (140-156)/(65-73) 156/73 (12/03 0510) SpO2:  [90 %-93 %] 93 % (12/03 0510) Last BM Date: 10/08/20  Intake/Output from previous day: 12/02 0701 - 12/03 0700 In: 1523.6 [P.O.:1020; I.V.:128.6; IV Piggyback:330] Out: 75 [Drains:75] Intake/Output this shift: Total I/O In: 32.8 [I.V.:3; IV Piggyback:29.8] Out: -   PE: Abd: soft, appropriately tender, great BS, ND, incisions c/d/i, drain present with tan purulent output.  Lab Results:  Recent Labs    10/07/20 0333 10/08/20 0321  WBC 12.3* 11.9*  HGB 10.1* 10.0*  HCT 31.4* 31.2*  PLT 478* 476*   BMET Recent Labs    10/06/20 0356  NA 138  K 3.7  CL 101  CO2 23  GLUCOSE 89  BUN 7*  CREATININE 0.52  CALCIUM 8.0*   PT/INR No results for input(s): LABPROT, INR in the last 72 hours. CMP     Component Value Date/Time   NA 138 10/06/2020 0356   K 3.7 10/06/2020 0356   CL 101 10/06/2020 0356   CO2 23 10/06/2020 0356   GLUCOSE 89 10/06/2020 0356   BUN 7 (L) 10/06/2020 0356   CREATININE 0.52 10/06/2020 0356   CALCIUM 8.0 (L) 10/06/2020 0356   PROT 5.7 (L) 10/05/2020 0355   ALBUMIN 2.0 (L) 10/05/2020 0355   AST 23 10/05/2020 0355   ALT 18 10/05/2020 0355   ALKPHOS 100 10/05/2020 0355   BILITOT 1.3 (H) 10/05/2020 0355   GFRNONAA >60 10/06/2020 0356   Lipase     Component Value Date/Time   LIPASE 25 09/28/2020 0850       Studies/Results: No results found.  Anti-infectives: Anti-infectives (From admission, onward)   Start     Dose/Rate Route Frequency Ordered Stop   10/02/20 1000  anidulafungin (ERAXIS)  100 mg in sodium chloride 0.9 % 100 mL IVPB        100 mg 78 mL/hr over 100 Minutes Intravenous Every 24 hours 10/01/20 0958 10/07/20 1248   10/01/20 1100  anidulafungin (ERAXIS) 200 mg in sodium chloride 0.9 % 200 mL IVPB        200 mg 78 mL/hr over 200 Minutes Intravenous  Once 10/01/20 1014 10/01/20 1638   09/29/20 2200  piperacillin-tazobactam (ZOSYN) IVPB 3.375 g        3.375 g 12.5 mL/hr over 240 Minutes Intravenous Every 8 hours 09/29/20 1524 10/08/20 0856   09/28/20 2000  piperacillin-tazobactam (ZOSYN) IVPB 3.375 g  Status:  Discontinued        3.375 g 12.5 mL/hr over 240 Minutes Intravenous Every 8 hours 09/28/20 1937 09/29/20 1524   09/28/20 1830  clindamycin (CLEOCIN) 900 mg, gentamicin (GARAMYCIN) 240 mg in sodium chloride 0.9 % 1,000 mL for intraperitoneal lavage         Irrigation To Surgery 09/28/20 1736 09/28/20 1806   09/28/20 1630  clindamycin (CLEOCIN) 900 mg, gentamicin (GARAMYCIN) 240 mg in sodium chloride 0.9 % 1,000 mL for intraperitoneal lavage  Status:  Discontinued       Note to Pharmacy: Have in the  OR room for final irrigation in bowel surgery case  to minimize risk of abscess/infection Pharmacy may adjust dosing strength, schedule, rate of infusion, etc as needed to optimize therapy    Irrigation To Surgery 09/28/20 1618 09/28/20 1649   09/28/20 1600  cefoTEtan (CEFOTAN) 2 g in sodium chloride 0.9 % 100 mL IVPB        2 g 200 mL/hr over 30 Minutes Intravenous On call to O.R. 09/28/20 1320 09/28/20 1605   09/28/20 1145  piperacillin-tazobactam (ZOSYN) IVPB 3.375 g        3.375 g 100 mL/hr over 30 Minutes Intravenous  Once 09/28/20 1144 09/28/20 1353       Assessment/Plan HLD OAB AKI -improved, Cr 0.52 yesterday ABL anemia- Hgb 10.1, stable Severe protein calorie malnutrition - prealbumin 7.2 (12/1).   Abdominal pain/free air Gangrenous perforated appendicitis, intra-abdominal and retroperitoneal abscesses, feculent peritonitis, POD 10,  S/pLAPAROSCOPIC DRAINAGE OF ABSESS X 2,LAPAROSCOPIC APPENDECTOMY,DIAGNOSTIC LAPAROSCOPYWITH ABDOMINAL WASHOUT11/23 Dr. Johney Maine - surgical path -acute appendicitis -both JP were removed11/29 -WBCdown to 11K, zosyn DC yesterday (completed course), will start oral augmentin for 5 more days.  Drain cultures show strep anginosis. -try soft diet today - continue to mobilize as tolerated -IR drain on 11/30 for abscess - may be getting close to discharge in the next 1-2 days pending how she tolerates her diet. -will need IR drain clinic follow up and teaching on drain care prior to DC  ID -zosyn11/23>>12/2, Augmentin 12/3 --> 5 days VTE -SCDs, lovenox  FEN -SLIV,soft diet, resource breeze Foley -removed 11/24 Follow up -Dr. Johney Maine   LOS: 10 days    Henreitta Cea , [facility] Surgery 10/08/2020, 10:13 AM Please see Amion for pager number during day hours 7:00am-4:30pm or 7:00am -11:30am on weekends

## 2020-10-08 NOTE — Progress Notes (Signed)
Referring Physician(s): Martin,M  Supervising Physician: Aletta Edouard  Patient Status:  [facility] - In-pt  Chief Complaint:  Left upper abdominal abscess  Subjective: Patient states she is doing fairly well today; denies significant abdominal pain, nausea, vomiting; currently eating regular diet; she has been ambulating   Allergies: Fosamax [alendronate]  Medications: Prior to Admission medications   Medication Sig Start Date End Date Taking? Authorizing Provider  aspirin 81 MG chewable tablet Chew 81 mg by mouth daily.   Yes [provider]  Biotin 1000 MCG tablet Take 1,000 mcg by mouth daily.   Yes [provider]  Calcium Carb-Cholecalciferol (CALCIUM 1000 + D) 1000-800 MG-UNIT TABS Take 1 tablet by mouth daily.   Yes [provider]  Multiple Vitamins-Minerals (CENTRUM SILVER) tablet Take 1 tablet by mouth daily.   Yes [provider]  MYRBETRIQ 50 MG TB24 tablet Take 50 mg by mouth daily. 09/23/20  Yes [provider]  rosuvastatin (CRESTOR) 10 MG tablet Take 10 mg by mouth daily. 08/22/20  Yes [provider]  VITAMIN D PO Take 1 tablet by mouth daily.   Yes [provider]     Vital Signs: BP (!) 146/80 (BP Location: Left Arm)   Pulse 86   Temp 98.1 F (36.7 C)   Resp 18   Ht 5\' 6"  (1.676 m)   Wt 156 lb 12 oz (71.1 kg)   SpO2 91%   BMI 25.30 kg/m   Physical Exam awake/alert: left abd drain intact, insertion site okay, not significantly tender, output 75 cc turbid beige fluid; drain irrigated with minimal return  Imaging: CT ABDOMEN PELVIS W CONTRAST  Result Date: 10/04/2020 CLINICAL DATA:  Status post appendectomy on 09/28/2020. Abdominal infection suspected. EXAM: CT ABDOMEN AND PELVIS WITH CONTRAST TECHNIQUE: Multidetector CT imaging of the abdomen and pelvis was performed using the standard protocol following bolus administration of intravenous contrast. CONTRAST:  18mL OMNIPAQUE IOHEXOL 300  MG/ML  SOLN COMPARISON:  09/28/2020 FINDINGS: Lower chest: Moderate bilateral pleural effusions with associated dependent compressive atelectasis. Hepatobiliary: Small irregular area of hypoattenuation along the inferior margin of the right liver lobe, not evident on the prior exam. This is suspicious for a small early abscess. It measures approximately 1.4 cm in greatest dimension. No other liver abnormality. There are dependent gallstones. There is gallbladder wall thickening and/or pericholecystic fluid, presumed reactive to the adjacent inflammatory changes. No bile duct dilation. Pancreas: No mass. Normal enhancement. There is adjacent fluid attenuation, but no convincing pancreatitis. Spleen: Normal in size without focal abnormality. Adrenals/Urinary Tract: No adrenal masses. Kidneys normal in size, orientation and position with symmetric enhancement and excretion. 2 cm anterior lower pole right renal cyst. Adjacent tiny lesion has also likely a cyst. No other renal masses, no stones and no hydronephrosis. Normal ureters. Bladder minimally distended otherwise unremarkable. Stomach/Bowel/Peritoneal cavity: Multiple peritoneal collections. Largest collection is in the left quadrant left mid abdomen, causing a concave indentation along the greater curvature of the stomach. This collection measures 18 x 6.4 x 7.7 cm. There are few non dependent bubbles of air along its superior margin. Another collection lies abutting the posteromedial liver, which may be intrahepatic along the base of the caudate lobe, measuring 4.4 x 4.5 x 4.4 cm. A third collection lies along the right pericolic gutter measuring approximately 7.2 x 3.1 x 3.8 cm. A small amount of subcapsular fluid extends along the inferior margin of the liver. Trace free fluid is noted in the pelvis. There are additional  areas hazy and reticular opacity consistent inflammation/edema. Stomach is mostly decompressed, compressed by the left upper quadrant  collection, otherwise unremarkable. Small bowel and colon are normal in caliber. Mild wall thickening noted of the ileum, presumed reactive to adjacent retrocecal inflammation. There are numerous colonic diverticula, most evident along the sigmoid, without evidence of diverticulitis. No free intraperitoneal air or retroperitoneal air. Vascular/Lymphatic: Dense aortic atherosclerosis. No aneurysm. No venous thrombosis. Enlarged left inguinal lymph node, 1.7 cm in short axis. No other enlarged lymph nodes. Reproductive: Postmenopausal uterus with associated calcifications consistent small fibroids. No adnexal masses. Other: Deep subcutaneous soft tissue air no along the right anterolateral abdomen. Trocar insertion sites evident in the left lower quadrant and left mid abdomen. Subcutaneous edema is noted along the lower abdomen and pelvis laterally. Musculoskeletal: No fracture or acute finding.  No bone lesion. IMPRESSION: 1. Interval worsening of peritoneal fluid collections consistent with abscesses. Largest collection lies the left upper quadrant, abutting the greater curvature of the stomach, significantly increased in size from the prior CT, now 18 cm in long axis. There is another collection that lies adjacent to, possibly extending into, the posteromedial liver adjacent to the caudate lobe, 4.5 cm in greatest dimension, also increased. Right pericolic gutter collection is also mildly increased in size, but is better defined than on the prior exam. 2. No free intraperitoneal air. 3. There are changes consistent with the interval laparoscopic appendectomy perform since the prior CT. Changes include deep subcutaneous soft tissue air. Electronically Signed   By: Lajean Manes M.D.   On: 10/04/2020 16:42   CT IMAGE GUIDED DRAINAGE BY PERCUTANEOUS CATHETER  Result Date: 10/05/2020 INDICATION: 79 year old female with left upper quadrant fluid collection referred for drainage EXAM: CT GUIDED DRAINAGE OF  ABSCESS  MEDICATIONS: The patient is currently admitted to the hospital and receiving intravenous antibiotics. The antibiotics were administered within an appropriate time frame prior to the initiation of the procedure. ANESTHESIA/SEDATION: 2.0 mg IV Versed 100 mcg IV Fentanyl Moderate Sedation Time:  16 minutes The patient was continuously monitored during the procedure by the interventional radiology nurse under my direct supervision. COMPLICATIONS: None TECHNIQUE: Informed written consent was obtained from the patient after a thorough discussion of the procedural risks, benefits and alternatives. All questions were addressed. Maximal Sterile Barrier Technique was utilized including caps, mask, sterile gowns, sterile gloves, sterile drape, hand hygiene and skin antiseptic. A timeout was performed prior to the initiation of the procedure. PROCEDURE: The operative field was prepped with Chlorhexidine in a sterile fashion, and a sterile drape was applied covering the operative field. A sterile gown and sterile gloves were used for the procedure. Local anesthesia was provided with 1% Lidocaine. Scout CT was acquired. The patient is prepped and draped in the usual sterile fashion. 1% lidocaine was used for local anesthesia. Trocar needle was advanced into the fluid collection of the left abdomen. Once we confirmed needle tip position, wire was advanced and then modified Seldinger technique was used to place a 12 [place] drain into the fluid collection. Approximately 280 cc of murky yellow fluid was aspirated. Culture was sent. Catheter was sutured in position and attached to bulb suction. Final CT was acquired. Patient tolerated the procedure well and remained hemodynamically stable throughout. No complications were encountered and no significant blood loss. FINDINGS: Initial CT image demonstrates fluid collection in the left upper quadrant with internal gas. Final CT demonstrates relative improved size of the fluid with a 12  [place] drain in position. IMPRESSION: Status  post CT-guided drainage of left upper quadrant fluid collection. Signed, Dulcy Fanny. Dellia Nims, RPVI [facility] Electronically Signed   By: Corrie Mckusick D.O.   On: 10/05/2020 17:27    Labs:  CBC: Recent Labs    10/05/20 0355 10/06/20 0356 10/07/20 0333 10/08/20 0321  WBC 16.7* 13.3* 12.3* 11.9*  HGB 10.4* 10.3* 10.1* 10.0*  HCT 32.1* 32.4* 31.4* 31.2*  PLT 428* 474* 478* 476*    COAGS: No results for input(s): INR, APTT in the last 8760 hours.  BMP: Recent Labs    10/03/20 0421 10/04/20 0233 10/05/20 0355 10/06/20 0356  NA 140 139 140 138  K 3.3* 4.4 3.7 3.7  CL 103 101 101 101  CO2 26 29 26 23   GLUCOSE 94 99 80 89  BUN 16 14 9  7*  CALCIUM 8.0* 8.0* 8.1* 8.0*  CREATININE 0.56 0.54 0.57 0.52  GFRNONAA >60 >60 >60 >60    LIVER FUNCTION TESTS: Recent Labs    10/02/20 0249 10/03/20 0421 10/04/20 0233 10/05/20 0355  BILITOT 0.9 1.2 1.0 1.3*  AST 31 27 22 23   ALT 33 25 19 18   ALKPHOS 114 97 85 100  PROT 5.6* 5.6* 5.5* 5.7*  ALBUMIN 2.0* 2.0* 1.9* 2.0*    Assessment and Plan: Patient with history of gangrenous perforated appendicitis with associated intra-abdominal/retroperitoneal abscesses/feculent peritonitis, status post laparoscopic drainage of abscess x2,appendectomy ,laparoscopy with abdominal washout on 11/23;status post drainage of left upper quadrant fluid collection on11/30;afebrile; WBC 11.9 down from 12.3, hemoglobin stable; drain fluid cultures with Streptococcus anginosus and rare E. Coli; continue drain irrigation and output monitoring; once output minimal obtain follow-up CT; if patient discharged home over weekend with drain recommend once daily irrigation of drain with 5 cc sterile saline, output recording and dressing changes every 1 to 2 days; patient instructed on how to do drain flushes; prescription for saline flushes given to patient as  well as output recording card; she will be scheduled for follow-up in IR drain clinic in 1-2 weeks   Electronically Signed: D. Rowe Robert, PA-C 10/08/2020, 2:55 PM   I spent a total of 15 minutes at the the patient's bedside AND on the patient's hospital floor or unit, greater than 50% of which was counseling/coordinating care for left abdominal abscess drain    Patient ID: Molly Manning, female   DOB: Jun 13, 1941, 79 y.o.   MRN: [id number]

## 2020-10-09 DIAGNOSIS — J9601 Acute respiratory failure with hypoxia: Secondary | ICD-10-CM

## 2020-10-09 LAB — CBC
HCT: 33 % — ABNORMAL LOW (ref 36.0–46.0)
Hemoglobin: 10.4 g/dL — ABNORMAL LOW (ref 12.0–15.0)
MCH: 30.4 pg (ref 26.0–34.0)
MCHC: 31.5 g/dL (ref 30.0–36.0)
MCV: 96.5 fL (ref 80.0–100.0)
Platelets: 475 10*3/uL — ABNORMAL HIGH (ref 150–400)
RBC: 3.42 MIL/uL — ABNORMAL LOW (ref 3.87–5.11)
RDW: 14.4 % (ref 11.5–15.5)
WBC: 8.7 10*3/uL (ref 4.0–10.5)
nRBC: 0 % (ref 0.0–0.2)

## 2020-10-09 NOTE — Progress Notes (Signed)
Patient ID: Molly Manning, female   DOB: 08/19/1941, 79 y.o.   MRN: [id number]  PROGRESS NOTE    Molly Manning  ZDG:[id number] DOB: 25-Apr-1941 DOA: 09/28/2020 PCP: Deland Pretty, MD   Brief Narrative:  79 y.o.femalewith medical history significant ofHLD, overactive bladder presented on 09/28/2020 with abdominal pain and was found to have perforated appendicitis on CT scan.  She was admitted under general surgery service and she underwent laparoscopic appendectomy with drainage of abscess and abdominal washout by general surgery.  [facility] was consulted for medical management.  Assessment & Plan:   Sepsis: Present on admission Gangrenous perforated appendicitis Intra-abdominal and retroperitoneal abscess Feculent peritonitis -Status post laparoscopic drainage of abscess x2, laparoscopic appendectomy, diagnostic laparoscopy with abdominal washout on 09/28/2020 -Care as per primary team: NG tube removed on 10/02/2020.  Diet advancement as per general surgery. -CT of the abdomen and pelvis showed worsening peritoneal fluid collections consistent with abscesses: Status post IR guided drain placement on 10/05/2020: Cultures growing moderate Streptococcus anginosis and E. coli.  Antibiotics as per primary surgical team -Sepsis has currently resolved.  Acute hypoxic respiratory failure Community-acquired pneumonia -Chest x-ray from 09/28/2020 showed patchy airspace opacities within the left midlung and lung base concerning for pneumonia.  Repeat chest x-ray on 09/30/2020 showed persistent small effusions with decreased aerations of both lower lung zones - bandlike atelectasis within L midlung -Respiratory status has improved: Currently on room air -Continue incentive spirometry.  Has been on Zosyn for a while and does not need any more antibiotics for her respiratory condition  Leukocytosis -Resolved  RUE Edema - RUE [place] negative for DVT.  Most likely secondary to IV infiltration  Elevated  LFTs - resolved  Normocytic Anemia  Iron Def Anemia       - likely dilutional, possible component of blood loss from surgery, continue to monitor  - labs c/w iron deficiency anemia: Plan for PO iron when able.  Hemoglobin stable for now  Thrombocytosis -Probably reactive.  Hypertension: she's not on home BP meds.  Blood pressure still on the higher side.  Continue amlodipine.  Might have to increase the dose as an outpatient if blood pressure still remains an issue  Hyperlipidemia -Continue Crestor  Overactive bladder -Continue myrbetriq  Subjective: Patient seen and examined at bedside.  Does not feel well this morning.  No overnight fever, worsening shortness of breath reported overnight  objective: Vitals:   10/08/20 0510 10/08/20 1353 10/08/20 2122 10/09/20 0514  BP: (!) 156/73 (!) 146/80 (!) 149/69 (!) 157/64  Pulse: 96 86 96 88  Resp: 16 18 18 18   Temp: 98 F (36.7 C) 98.1 F (36.7 C) 97.8 F (36.6 C) 98.3 F (36.8 C)  TempSrc:   Oral Oral  SpO2: 93% 91% 93% 93%  Weight:      Height:        Intake/Output Summary (Last 24 hours) at 10/09/2020 0759 Last data filed at 10/09/2020 9518 Gross per 24 hour  Intake 887.83 ml  Output 47 ml  Net 840.83 ml   Filed Weights   10/05/20 0621 10/06/20 0624 10/07/20 0500  Weight: 77.8 kg 71.8 kg 71.1 kg    Examination:  General exam: Chronically ill looking female lying in bed.  No acute distress. Respiratory system: Decreased breath sounds at bases bilaterally with some scattered crackles  cardiovascular system: Rate controlled, S1-2 heard gastrointestinal system: Abdomen is mildly distended, soft and mildly tender around the left lower quadrant with dressing and drain present.  Normal bowel sounds heard  extremities: Mild lower extremity edema present; no clubbing   Data Reviewed: I have personally reviewed following labs and imaging studies  CBC: Recent Labs  Lab 10/03/20 0421 10/03/20 0421 10/04/20 0233  10/04/20 0233 10/05/20 0355 10/06/20 0356 10/07/20 0333 10/08/20 0321 10/09/20 0435  WBC 17.5*   < > 20.3*   < > 16.7* 13.3* 12.3* 11.9* 8.7  NEUTROABS 13.5*  --  16.2*  --  13.8*  --   --   --   --   HGB 10.9*   < > 10.1*   < > 10.4* 10.3* 10.1* 10.0* 10.4*  HCT 32.9*   < > 31.8*   < > 32.1* 32.4* 31.4* 31.2* 33.0*  MCV 91.6   < > 95.8   < > 94.7 96.7 94.6 96.0 96.5  PLT 369   < > 366   < > 428* 474* 478* 476* 475*   < > = values in this interval not displayed.   Basic Metabolic Panel: Recent Labs  Lab 10/03/20 0421 10/04/20 0233 10/05/20 0355 10/06/20 0356  NA 140 139 140 138  K 3.3* 4.4 3.7 3.7  CL 103 101 101 101  CO2 26 29 26 23   GLUCOSE 94 99 80 89  BUN 16 14 9  7*  CREATININE 0.56 0.54 0.57 0.52  CALCIUM 8.0* 8.0* 8.1* 8.0*  MG 1.8 2.1 1.8  --   PHOS 3.1 3.1 2.9  --    GFR: Estimated Creatinine Clearance: 54.3 mL/min (by C-G formula based on SCr of 0.52 mg/dL). Liver Function Tests: Recent Labs  Lab 10/03/20 0421 10/04/20 0233 10/05/20 0355  AST 27 22 23   ALT 25 19 18   ALKPHOS 97 85 100  BILITOT 1.2 1.0 1.3*  PROT 5.6* 5.5* 5.7*  ALBUMIN 2.0* 1.9* 2.0*   No results for input(s): LIPASE, AMYLASE in the last 168 hours. No results for input(s): AMMONIA in the last 168 hours. Coagulation Profile: No results for input(s): INR, PROTIME in the last 168 hours. Cardiac Enzymes: No results for input(s): CKTOTAL, CKMB, CKMBINDEX, TROPONINI in the last 168 hours. BNP (last 3 results) No results for input(s): PROBNP in the last 8760 hours. HbA1C: No results for input(s): HGBA1C in the last 72 hours. CBG: No results for input(s): GLUCAP in the last 168 hours. Lipid Profile: No results for input(s): CHOL, HDL, LDLCALC, TRIG, CHOLHDL, LDLDIRECT in the last 72 hours. Thyroid Function Tests: No results for input(s): TSH, T4TOTAL, FREET4, T3FREE, THYROIDAB in the last 72 hours. Anemia Panel: No results for input(s): VITAMINB12, FOLATE, FERRITIN, TIBC, IRON,  RETICCTPCT in the last 72 hours. Sepsis Labs: No results for input(s): PROCALCITON, LATICACIDVEN in the last 168 hours.  Recent Results (from the past 240 hour(s))  Aerobic/Anaerobic Culture (surgical/deep wound)     Status: None (Preliminary result)   Collection Time: 10/05/20  4:03 PM   Specimen: Abscess  Result Value Ref Range Status   Specimen Description ABSCESS ABDOMEN  Final   Special Requests NONE  Final   Gram Stain   Final    ABUNDANT WBC PRESENT,BOTH PMN AND MONONUCLEAR NO ORGANISMS SEEN    Culture   Final    MODERATE STREPTOCOCCUS ANGINOSIS RARE ESCHERICHIA COLI NO ANAEROBES ISOLATED; CULTURE IN PROGRESS FOR 5 DAYS    Report Status PENDING  Incomplete   Organism ID, Bacteria ESCHERICHIA COLI  Final   Organism ID, Bacteria STREPTOCOCCUS ANGINOSIS  Final      Susceptibility   Escherichia coli - MIC*    AMPICILLIN >=32 RESISTANT  Resistant     CEFAZOLIN <=4 SENSITIVE Sensitive     CEFEPIME <=0.12 SENSITIVE Sensitive     CEFTAZIDIME <=1 SENSITIVE Sensitive     CEFTRIAXONE <=0.25 SENSITIVE Sensitive     CIPROFLOXACIN <=0.25 SENSITIVE Sensitive     GENTAMICIN 8 INTERMEDIATE Intermediate     IMIPENEM <=0.25 SENSITIVE Sensitive     TRIMETH/SULFA >=320 RESISTANT Resistant     AMPICILLIN/SULBACTAM >=32 RESISTANT Resistant     PIP/TAZO <=4 SENSITIVE Sensitive     * RARE ESCHERICHIA COLI   Streptococcus anginosis - MIC*    PENICILLIN 0.12 SENSITIVE Sensitive     CEFTRIAXONE 0.5 SENSITIVE Sensitive     ERYTHROMYCIN <=0.12 SENSITIVE Sensitive     LEVOFLOXACIN 0.5 SENSITIVE Sensitive     VANCOMYCIN Value in next row Sensitive      0.5 SENSITIVEPerformed at [facility] [address] [place], [place] [address]    * MODERATE STREPTOCOCCUS ANGINOSIS         Radiology Studies: No results found.      Scheduled Meds: . acetaminophen  1,000 mg Oral TID  . amLODipine  5 mg Oral Daily  . aspirin  81 mg Oral Daily  . cephALEXin  500 mg Oral Q8H  .  Chlorhexidine Gluconate Cloth  6 each Topical Daily  . enoxaparin (LOVENOX) injection  40 mg Subcutaneous Q24H  . feeding supplement  1 Container Oral TID BM  . lip balm  1 application Topical BID  . mouth rinse  15 mL Mouth Rinse BID  . mirabegron ER  50 mg Oral Daily  . multivitamin with minerals  1 tablet Oral Daily  . pantoprazole  40 mg Oral QHS  . psyllium  1 packet Oral BID  . rosuvastatin  10 mg Oral Daily  . sodium chloride flush  5 mL Intracatheter Q8H   Continuous Infusions:         Aline August, MD [facility] 10/09/2020, 7:59 AM

## 2020-10-09 NOTE — Progress Notes (Signed)
Referring Physician(s):Martin,M  Supervising Physician: Markus Daft  Patient Status:  [facility] - In-pt  Chief Complaint: Abdominal pain/left upper abdominal abscess   Subjective: Patient doing fairly well now with some mild abdominal pain; had more significant abdominal pain earlier today; currently denies fever, headache, chest pain, dyspnea, cough, vomiting or bleeding; did have some nausea earlier.   Allergies: Fosamax [alendronate]  Medications: Prior to Admission medications   Medication Sig Start Date End Date Taking? Authorizing Provider  aspirin 81 MG chewable tablet Chew 81 mg by mouth daily.   Yes [provider]  Biotin 1000 MCG tablet Take 1,000 mcg by mouth daily.   Yes [provider]  Calcium Carb-Cholecalciferol (CALCIUM 1000 + D) 1000-800 MG-UNIT TABS Take 1 tablet by mouth daily.   Yes [provider]  Multiple Vitamins-Minerals (CENTRUM SILVER) tablet Take 1 tablet by mouth daily.   Yes [provider]  MYRBETRIQ 50 MG TB24 tablet Take 50 mg by mouth daily. 09/23/20  Yes [provider]  rosuvastatin (CRESTOR) 10 MG tablet Take 10 mg by mouth daily. 08/22/20  Yes [provider]  VITAMIN D PO Take 1 tablet by mouth daily.   Yes [provider]     Vital Signs: BP (!) 128/57 (BP Location: Right Arm)   Pulse 89   Temp 98 F (36.7 C) (Oral)   Resp 16   Ht 5\' 6"  (1.676 m)   Wt 156 lb 12 oz (71.1 kg)   SpO2 92%   BMI 25.30 kg/m   Physical Exam awake, alert.  Left abdominal drain intact, insertion site okay, not significantly tender to palpation ,output 45 cc yesterday, 25 cc today turbid, beige-colored fluid   Imaging: CT IMAGE GUIDED DRAINAGE BY PERCUTANEOUS CATHETER  Result Date: 10/05/2020 INDICATION: 79 year old female with left upper quadrant fluid collection referred for drainage EXAM: CT GUIDED DRAINAGE OF  ABSCESS MEDICATIONS: The patient is currently admitted to the hospital and  receiving intravenous antibiotics. The antibiotics were administered within an appropriate time frame prior to the initiation of the procedure. ANESTHESIA/SEDATION: 2.0 mg IV Versed 100 mcg IV Fentanyl Moderate Sedation Time:  16 minutes The patient was continuously monitored during the procedure by the interventional radiology nurse under my direct supervision. COMPLICATIONS: None TECHNIQUE: Informed written consent was obtained from the patient after a thorough discussion of the procedural risks, benefits and alternatives. All questions were addressed. Maximal Sterile Barrier Technique was utilized including caps, mask, sterile gowns, sterile gloves, sterile drape, hand hygiene and skin antiseptic. A timeout was performed prior to the initiation of the procedure. PROCEDURE: The operative field was prepped with Chlorhexidine in a sterile fashion, and a sterile drape was applied covering the operative field. A sterile gown and sterile gloves were used for the procedure. Local anesthesia was provided with 1% Lidocaine. Scout CT was acquired. The patient is prepped and draped in the usual sterile fashion. 1% lidocaine was used for local anesthesia. Trocar needle was advanced into the fluid collection of the left abdomen. Once we confirmed needle tip position, wire was advanced and then modified Seldinger technique was used to place a 12 [place] drain into the fluid collection. Approximately 280 cc of murky yellow fluid was aspirated. Culture was sent. Catheter was sutured in position and attached to bulb suction. Final CT was acquired. Patient tolerated the procedure well and remained hemodynamically stable throughout. No complications were encountered and no significant blood loss. FINDINGS: Initial CT image demonstrates fluid collection in the left upper  quadrant with internal gas. Final CT demonstrates relative improved size of the fluid with a 12 [place] drain in position. IMPRESSION: Status post CT-guided drainage  of left upper quadrant fluid collection. Signed, Dulcy Fanny. Dellia Nims, RPVI [facility] Electronically Signed   By: Corrie Mckusick D.O.   On: 10/05/2020 17:27    Labs:  CBC: Recent Labs    10/06/20 0356 10/07/20 0333 10/08/20 0321 10/09/20 0435  WBC 13.3* 12.3* 11.9* 8.7  HGB 10.3* 10.1* 10.0* 10.4*  HCT 32.4* 31.4* 31.2* 33.0*  PLT 474* 478* 476* 475*    COAGS: No results for input(s): INR, APTT in the last 8760 hours.  BMP: Recent Labs    10/03/20 0421 10/04/20 0233 10/05/20 0355 10/06/20 0356  NA 140 139 140 138  K 3.3* 4.4 3.7 3.7  CL 103 101 101 101  CO2 26 29 26 23   GLUCOSE 94 99 80 89  BUN 16 14 9  7*  CALCIUM 8.0* 8.0* 8.1* 8.0*  CREATININE 0.56 0.54 0.57 0.52  GFRNONAA >60 >60 >60 >60    LIVER FUNCTION TESTS: Recent Labs    10/02/20 0249 10/03/20 0421 10/04/20 0233 10/05/20 0355  BILITOT 0.9 1.2 1.0 1.3*  AST 31 27 22 23   ALT 33 25 19 18   ALKPHOS 114 97 85 100  PROT 5.6* 5.6* 5.5* 5.7*  ALBUMIN 2.0* 2.0* 1.9* 2.0*    Assessment and Plan: Patient with history of gangrenous perforated appendicitis with associated intra-abdominal/retroperitoneal abscesses/feculent peritonitis, status post laparoscopic drainage of abscess x2,appendectomy ,laparoscopy with abdominal washout on 11/23;status post drainage of left upper quadrant fluid collection on11/30;afebrile, WBC normal, hemoglobin stable, drain fluid cultures with Streptococcus anginosus and rare E. Coli; continue drain irrigation and output monitoring; once output minimal or if symptoms worsen obtain follow-up CT; if patient discharged home over weekend with drain recommend once daily irrigation of drain with 5 cc sterile saline, output recording and dressing changes every 1 to 2 days; patient instructed on how to do drain flushes; prescription for saline flushes given to patient as well as output recording card; she will be scheduled for  follow-up in IR drain clinic in 1-2 weeks   Electronically Signed: D. Rowe Robert, PA-C 10/09/2020, 3:17 PM   I spent a total of 15 minutes at the the patient's bedside AND on the patient's hospital floor or unit, greater than 50% of which was counseling/coordinating care for left abdominal abscess drain    Patient ID: Molly Manning, female   DOB: August 25, 1941, 79 y.o.   MRN: [id number]

## 2020-10-10 LAB — AEROBIC/ANAEROBIC CULTURE W GRAM STAIN (SURGICAL/DEEP WOUND)

## 2020-10-10 MED ORDER — HYDROCORTISONE 1 % EX CREA
TOPICAL_CREAM | Freq: Two times a day (BID) | CUTANEOUS | Status: DC
  Administered 2020-10-12: 1 via TOPICAL
  Filled 2020-10-10: qty 28

## 2020-10-10 NOTE — Progress Notes (Signed)
12 Days Post-Op  Subjective: Patient having some trouble tolerating a diet.  Min apettite  Objective: Vital signs in last 24 hours: Temp:  [98 F (36.7 C)-98.2 F (36.8 C)] 98.2 F (36.8 C) (12/05 0527) Pulse Rate:  [89-94] 92 (12/05 0527) Resp:  [16-18] 18 (12/05 0527) BP: (128-158)/(57-70) 158/70 (12/05 0527) SpO2:  [92 %-95 %] 95 % (12/05 0527) Weight:  [70.2 kg] 70.2 kg (12/05 0500) Last BM Date: 10/09/20  Intake/Output from previous day: 12/04 0701 - 12/05 0700 In: 955 [P.O.:940; I.V.:10] Out: 59 [Urine:1; Drains:58] Intake/Output this shift: No intake/output data recorded.  PE: Abd: soft, appropriately tender, ND, incisions c/d/i, drain present with tan purulent output.  Lab Results:  Recent Labs    10/08/20 0321 10/09/20 0435  WBC 11.9* 8.7  HGB 10.0* 10.4*  HCT 31.2* 33.0*  PLT 476* 475*   BMET No results for input(s): NA, K, CL, CO2, GLUCOSE, BUN, CREATININE, CALCIUM in the last 72 hours. PT/INR No results for input(s): LABPROT, INR in the last 72 hours. CMP     Component Value Date/Time   NA 138 10/06/2020 0356   K 3.7 10/06/2020 0356   CL 101 10/06/2020 0356   CO2 23 10/06/2020 0356   GLUCOSE 89 10/06/2020 0356   BUN 7 (L) 10/06/2020 0356   CREATININE 0.52 10/06/2020 0356   CALCIUM 8.0 (L) 10/06/2020 0356   PROT 5.7 (L) 10/05/2020 0355   ALBUMIN 2.0 (L) 10/05/2020 0355   AST 23 10/05/2020 0355   ALT 18 10/05/2020 0355   ALKPHOS 100 10/05/2020 0355   BILITOT 1.3 (H) 10/05/2020 0355   GFRNONAA >60 10/06/2020 0356   Lipase     Component Value Date/Time   LIPASE 25 09/28/2020 0850       Studies/Results: No results found.  Anti-infectives: Anti-infectives (From admission, onward)   Start     Dose/Rate Route Frequency Ordered Stop   10/08/20 2300  cephALEXin (KEFLEX) capsule 500 mg        500 mg Oral Every 8 hours 10/08/20 2204     10/08/20 1800  amoxicillin-clavulanate (AUGMENTIN) 875-125 MG per tablet 1 tablet  Status:   Discontinued        1 tablet Oral Every 12 hours 10/08/20 1015 10/08/20 2204   10/02/20 1000  anidulafungin (ERAXIS) 100 mg in sodium chloride 0.9 % 100 mL IVPB        100 mg 78 mL/hr over 100 Minutes Intravenous Every 24 hours 10/01/20 0958 10/07/20 1248   10/01/20 1100  anidulafungin (ERAXIS) 200 mg in sodium chloride 0.9 % 200 mL IVPB        200 mg 78 mL/hr over 200 Minutes Intravenous  Once 10/01/20 1014 10/01/20 1638   09/29/20 2200  piperacillin-tazobactam (ZOSYN) IVPB 3.375 g        3.375 g 12.5 mL/hr over 240 Minutes Intravenous Every 8 hours 09/29/20 1524 10/08/20 1604   09/28/20 2000  piperacillin-tazobactam (ZOSYN) IVPB 3.375 g  Status:  Discontinued        3.375 g 12.5 mL/hr over 240 Minutes Intravenous Every 8 hours 09/28/20 1937 09/29/20 1524   09/28/20 1830  clindamycin (CLEOCIN) 900 mg, gentamicin (GARAMYCIN) 240 mg in sodium chloride 0.9 % 1,000 mL for intraperitoneal lavage         Irrigation To Surgery 09/28/20 1736 09/28/20 1806   09/28/20 1630  clindamycin (CLEOCIN) 900 mg, gentamicin (GARAMYCIN) 240 mg in sodium chloride 0.9 % 1,000 mL for intraperitoneal lavage  Status:  Discontinued  Note to Pharmacy: Have in the  OR room for final irrigation in bowel surgery case to minimize risk of abscess/infection Pharmacy may adjust dosing strength, schedule, rate of infusion, etc as needed to optimize therapy    Irrigation To Surgery 09/28/20 1618 09/28/20 1649   09/28/20 1600  cefoTEtan (CEFOTAN) 2 g in sodium chloride 0.9 % 100 mL IVPB        2 g 200 mL/hr over 30 Minutes Intravenous On call to O.R. 09/28/20 1320 09/28/20 1605   09/28/20 1145  piperacillin-tazobactam (ZOSYN) IVPB 3.375 g        3.375 g 100 mL/hr over 30 Minutes Intravenous  Once 09/28/20 1144 09/28/20 1353       Assessment/Plan HLD OAB AKI -improved, Cr 0.52 yesterday ABL anemia- Hgb 10.1, stable Severe protein calorie malnutrition - prealbumin 7.2 (12/1).   Abdominal pain/free  air Gangrenous perforated appendicitis, intra-abdominal and retroperitoneal abscesses, feculent peritonitis, POD 11, S/pLAPAROSCOPIC DRAINAGE OF ABSESS X 2,LAPAROSCOPIC APPENDECTOMY,DIAGNOSTIC LAPAROSCOPYWITH ABDOMINAL WASHOUT11/23 Dr. Johney Maine - surgical path -acute appendicitis -both JP were removed11/29 -WBCdown to 11K, zosyn DC yesterday (completed course), will cont oral augmentin for 5 more days.  Drain cultures show strep anginosis. -Cont soft diet today - continue to mobilize as tolerated -IR drain on 11/30 for abscess - may be getting close to discharge in the next 1-2 days pending how she tolerates her diet. -will need IR drain clinic follow up and teaching on drain care prior to DC  ID -zosyn11/23>>12/2, Augmentin 12/3 --> 5 days VTE -SCDs, lovenox  FEN -SLIV,soft diet, resource breeze Foley -removed 11/24 Follow up -Dr. Johney Maine   LOS: 12 days    Rosario Adie , [facility] 10/10/2020, 8:54 AM Please see Amion for pager number during day hours 7:00am-4:30pm or 7:00am -11:30am on weekends

## 2020-10-10 NOTE — Progress Notes (Addendum)
12 Days Post-Op  Subjective: Slowly improving.  Not much apettite yet    Objective: Vital signs in last 24 hours: Temp:  [98 F (36.7 C)-98.2 F (36.8 C)] 98.2 F (36.8 C) (12/05 0527) Pulse Rate:  [89-94] 92 (12/05 0527) Resp:  [16-18] 18 (12/05 0527) BP: (128-158)/(57-70) 158/70 (12/05 0527) SpO2:  [92 %-95 %] 95 % (12/05 0527) Weight:  [70.2 kg] 70.2 kg (12/05 0500) Last BM Date: 10/09/20  Intake/Output from previous day: 12/04 0701 - 12/05 0700 In: 955 [P.O.:940; I.V.:10] Out: 59 [Urine:1; Drains:58] Intake/Output this shift: No intake/output data recorded.  PE: Abd: soft, appropriately tender,  ND, incisions c/d/i, drain present with tan purulent output.  Lab Results:  Recent Labs    10/08/20 0321 10/09/20 0435  WBC 11.9* 8.7  HGB 10.0* 10.4*  HCT 31.2* 33.0*  PLT 476* 475*   BMET No results for input(s): NA, K, CL, CO2, GLUCOSE, BUN, CREATININE, CALCIUM in the last 72 hours. PT/INR No results for input(s): LABPROT, INR in the last 72 hours. CMP     Component Value Date/Time   NA 138 10/06/2020 0356   K 3.7 10/06/2020 0356   CL 101 10/06/2020 0356   CO2 23 10/06/2020 0356   GLUCOSE 89 10/06/2020 0356   BUN 7 (L) 10/06/2020 0356   CREATININE 0.52 10/06/2020 0356   CALCIUM 8.0 (L) 10/06/2020 0356   PROT 5.7 (L) 10/05/2020 0355   ALBUMIN 2.0 (L) 10/05/2020 0355   AST 23 10/05/2020 0355   ALT 18 10/05/2020 0355   ALKPHOS 100 10/05/2020 0355   BILITOT 1.3 (H) 10/05/2020 0355   GFRNONAA >60 10/06/2020 0356   Lipase     Component Value Date/Time   LIPASE 25 09/28/2020 0850       Studies/Results: No results found.  Anti-infectives: Anti-infectives (From admission, onward)   Start     Dose/Rate Route Frequency Ordered Stop   10/08/20 2300  cephALEXin (KEFLEX) capsule 500 mg        500 mg Oral Every 8 hours 10/08/20 2204     10/08/20 1800  amoxicillin-clavulanate (AUGMENTIN) 875-125 MG per tablet 1 tablet  Status:  Discontinued        1  tablet Oral Every 12 hours 10/08/20 1015 10/08/20 2204   10/02/20 1000  anidulafungin (ERAXIS) 100 mg in sodium chloride 0.9 % 100 mL IVPB        100 mg 78 mL/hr over 100 Minutes Intravenous Every 24 hours 10/01/20 0958 10/07/20 1248   10/01/20 1100  anidulafungin (ERAXIS) 200 mg in sodium chloride 0.9 % 200 mL IVPB        200 mg 78 mL/hr over 200 Minutes Intravenous  Once 10/01/20 1014 10/01/20 1638   09/29/20 2200  piperacillin-tazobactam (ZOSYN) IVPB 3.375 g        3.375 g 12.5 mL/hr over 240 Minutes Intravenous Every 8 hours 09/29/20 1524 10/08/20 1604   09/28/20 2000  piperacillin-tazobactam (ZOSYN) IVPB 3.375 g  Status:  Discontinued        3.375 g 12.5 mL/hr over 240 Minutes Intravenous Every 8 hours 09/28/20 1937 09/29/20 1524   09/28/20 1830  clindamycin (CLEOCIN) 900 mg, gentamicin (GARAMYCIN) 240 mg in sodium chloride 0.9 % 1,000 mL for intraperitoneal lavage         Irrigation To Surgery 09/28/20 1736 09/28/20 1806   09/28/20 1630  clindamycin (CLEOCIN) 900 mg, gentamicin (GARAMYCIN) 240 mg in sodium chloride 0.9 % 1,000 mL for intraperitoneal lavage  Status:  Discontinued  Note to Pharmacy: Have in the  OR room for final irrigation in bowel surgery case to minimize risk of abscess/infection Pharmacy may adjust dosing strength, schedule, rate of infusion, etc as needed to optimize therapy    Irrigation To Surgery 09/28/20 1618 09/28/20 1649   09/28/20 1600  cefoTEtan (CEFOTAN) 2 g in sodium chloride 0.9 % 100 mL IVPB        2 g 200 mL/hr over 30 Minutes Intravenous On call to O.R. 09/28/20 1320 09/28/20 1605   09/28/20 1145  piperacillin-tazobactam (ZOSYN) IVPB 3.375 g        3.375 g 100 mL/hr over 30 Minutes Intravenous  Once 09/28/20 1144 09/28/20 1353       Assessment/Plan HLD OAB AKI -improved ABL anemia- Hgb 10.1, stable Severe protein calorie malnutrition - prealbumin 7.2 (12/1).   Abdominal pain/free air Gangrenous perforated appendicitis,  intra-abdominal and retroperitoneal abscesses, feculent peritonitis, POD 12, S/pLAPAROSCOPIC DRAINAGE OF ABSESS X 2,LAPAROSCOPIC APPENDECTOMY,DIAGNOSTIC LAPAROSCOPYWITH ABDOMINAL WASHOUT11/23 Dr. Johney Maine - surgical path -acute appendicitis -both JP were removed11/29 -WBC back to normal, zosyn (completed course), cultures resistant to augmentin, will start cipro for 5 more days (12/4-12/9).  Drain cultures show strep anginosis and e coli. -cont soft diet today - continue to mobilize as tolerated -IR drain on 11/30 for abscess - may be getting close to discharge in the next 1-2 days pending how she tolerates her diet. -will need IR drain clinic follow up and teaching on drain care prior to DC  ID -zosyn11/23>>12/2, Augmentin 12/3 (resistent e coli) --> --> Keflex 5 days 12/4-12/9 VTE -SCDs, lovenox  FEN -SLIV,soft diet, resource breeze Foley -removed 11/24 Follow up -Dr. Johney Maine   LOS: 12 days    Rosario Adie , [facility] 10/10/2020, 8:49 AM Please see Amion for pager number during day hours 7:00am-4:30pm or 7:00am -11:30am on weekends

## 2020-10-10 NOTE — Progress Notes (Signed)
Patient ID: Molly Manning, female   DOB: May 04, 1941, 79 y.o.   MRN: [id number]  PROGRESS NOTE    Molly Manning  TIW:[id number] DOB: 02-Sep-1941 DOA: 09/28/2020 PCP: Deland Pretty, MD   Brief Narrative:  79 y.o.femalewith medical history significant ofHLD, overactive bladder presented on 09/28/2020 with abdominal pain and was found to have perforated appendicitis on CT scan.  She was admitted under general surgery service and she underwent laparoscopic appendectomy with drainage of abscess and abdominal washout by general surgery.  [facility] was consulted for medical management.  Assessment & Plan:   Sepsis: Present on admission Gangrenous perforated appendicitis Intra-abdominal and retroperitoneal abscess Feculent peritonitis -Status post laparoscopic drainage of abscess x2, laparoscopic appendectomy, diagnostic laparoscopy with abdominal washout on 09/28/2020 -Care as per primary team: NG tube removed on 10/02/2020.  Diet advancement as per general surgery. -CT of the abdomen and pelvis showed worsening peritoneal fluid collections consistent with abscesses: Status post IR guided drain placement on 10/05/2020: Cultures growing moderate Streptococcus anginosis and E. coli.  Antibiotics as per primary surgical team -Sepsis has currently resolved.  Acute hypoxic respiratory failure Community-acquired pneumonia -Chest x-ray from 09/28/2020 showed patchy airspace opacities within the left midlung and lung base concerning for pneumonia.  Repeat chest x-ray on 09/30/2020 showed persistent small effusions with decreased aerations of both lower lung zones - bandlike atelectasis within L midlung -Respiratory status has improved: Currently on room air -Continue incentive spirometry.    Leukocytosis -Resolved  RUE Edema - RUE [place] negative for DVT.  Most likely secondary to IV infiltration  Elevated LFTs - resolved  Normocytic Anemia  Iron Def Anemia       - likely dilutional, possible  component of blood loss from surgery, continue to monitor  - labs c/w iron deficiency anemia: Plan for PO iron when able.  Hemoglobin stable for now  Thrombocytosis -Probably reactive.  Hypertension: she's not on home BP meds.  Blood pressure still on the higher side.  Continue amlodipine.  Might have to increase the dose as an outpatient if blood pressure still remains an issue  Hyperlipidemia -Continue Crestor  Overactive bladder -Continue myrbetriq  Subjective: Patient seen and examined at bedside.  Her appetite is still poor and patient does not feel well this morning.  Denies any worsening shortness of breath, fever, nausea or vomiting. objective: Vitals:   10/09/20 1355 10/09/20 2211 10/10/20 0500 10/10/20 0527  BP: (!) 128/57 139/67  (!) 158/70  Pulse: 89 94  92  Resp: 16 18  18   Temp: 98 F (36.7 C) 98 F (36.7 C)  98.2 F (36.8 C)  TempSrc: Oral Oral  Oral  SpO2: 92% 93%  95%  Weight:   70.2 kg   Height:        Intake/Output Summary (Last 24 hours) at 10/10/2020 0947 Last data filed at 10/10/2020 2505 Gross per 24 hour  Intake 975 ml  Output 59 ml  Net 916 ml   Filed Weights   10/06/20 0624 10/07/20 0500 10/10/20 0500  Weight: 71.8 kg 71.1 kg 70.2 kg    Examination:  General exam: No distress.  Chronically ill looking female lying in bed.  Currently on room air.   Respiratory system: Bilateral decreased breath sounds at bases, no wheezing  cardiovascular system: S1-S2 heard, rate controlled  gastrointestinal system: Abdomen is slightly distended, soft and mildly tender around the left lower quadrant with dressing and drain present.  Bowel sounds heard extremities: No cyanosis.  Trace lower extremity edema present.  Data  Reviewed: I have personally reviewed following labs and imaging studies  CBC: Recent Labs  Lab 10/04/20 0233 10/04/20 0233 10/05/20 0355 10/06/20 0356 10/07/20 0333 10/08/20 0321 10/09/20 0435  WBC 20.3*   < > 16.7* 13.3* 12.3*  11.9* 8.7  NEUTROABS 16.2*  --  13.8*  --   --   --   --   HGB 10.1*   < > 10.4* 10.3* 10.1* 10.0* 10.4*  HCT 31.8*   < > 32.1* 32.4* 31.4* 31.2* 33.0*  MCV 95.8   < > 94.7 96.7 94.6 96.0 96.5  PLT 366   < > 428* 474* 478* 476* 475*   < > = values in this interval not displayed.   Basic Metabolic Panel: Recent Labs  Lab 10/04/20 0233 10/05/20 0355 10/06/20 0356  NA 139 140 138  K 4.4 3.7 3.7  CL 101 101 101  CO2 29 26 23   GLUCOSE 99 80 89  BUN 14 9 7*  CREATININE 0.54 0.57 0.52  CALCIUM 8.0* 8.1* 8.0*  MG 2.1 1.8  --   PHOS 3.1 2.9  --    GFR: Estimated Creatinine Clearance: 54.3 mL/min (by C-G formula based on SCr of 0.52 mg/dL). Liver Function Tests: Recent Labs  Lab 10/04/20 0233 10/05/20 0355  AST 22 23  ALT 19 18  ALKPHOS 85 100  BILITOT 1.0 1.3*  PROT 5.5* 5.7*  ALBUMIN 1.9* 2.0*   No results for input(s): LIPASE, AMYLASE in the last 168 hours. No results for input(s): AMMONIA in the last 168 hours. Coagulation Profile: No results for input(s): INR, PROTIME in the last 168 hours. Cardiac Enzymes: No results for input(s): CKTOTAL, CKMB, CKMBINDEX, TROPONINI in the last 168 hours. BNP (last 3 results) No results for input(s): PROBNP in the last 8760 hours. HbA1C: No results for input(s): HGBA1C in the last 72 hours. CBG: No results for input(s): GLUCAP in the last 168 hours. Lipid Profile: No results for input(s): CHOL, HDL, LDLCALC, TRIG, CHOLHDL, LDLDIRECT in the last 72 hours. Thyroid Function Tests: No results for input(s): TSH, T4TOTAL, FREET4, T3FREE, THYROIDAB in the last 72 hours. Anemia Panel: No results for input(s): VITAMINB12, FOLATE, FERRITIN, TIBC, IRON, RETICCTPCT in the last 72 hours. Sepsis Labs: No results for input(s): PROCALCITON, LATICACIDVEN in the last 168 hours.  Recent Results (from the past 240 hour(s))  Aerobic/Anaerobic Culture (surgical/deep wound)     Status: None (Preliminary result)   Collection Time: 10/05/20  4:03 PM     Specimen: Abscess  Result Value Ref Range Status   Specimen Description ABSCESS ABDOMEN  Final   Special Requests NONE  Final   Gram Stain   Final    ABUNDANT WBC PRESENT,BOTH PMN AND MONONUCLEAR NO ORGANISMS SEEN    Culture   Final    MODERATE STREPTOCOCCUS ANGINOSIS RARE ESCHERICHIA COLI NO ANAEROBES ISOLATED; CULTURE IN PROGRESS FOR 5 DAYS    Report Status PENDING  Incomplete   Organism ID, Bacteria ESCHERICHIA COLI  Final   Organism ID, Bacteria STREPTOCOCCUS ANGINOSIS  Final      Susceptibility   Escherichia coli - MIC*    AMPICILLIN >=32 RESISTANT Resistant     CEFAZOLIN <=4 SENSITIVE Sensitive     CEFEPIME <=0.12 SENSITIVE Sensitive     CEFTAZIDIME <=1 SENSITIVE Sensitive     CEFTRIAXONE <=0.25 SENSITIVE Sensitive     CIPROFLOXACIN <=0.25 SENSITIVE Sensitive     GENTAMICIN 8 INTERMEDIATE Intermediate     IMIPENEM <=0.25 SENSITIVE Sensitive     TRIMETH/SULFA >=320  RESISTANT Resistant     AMPICILLIN/SULBACTAM >=32 RESISTANT Resistant     PIP/TAZO <=4 SENSITIVE Sensitive     * RARE ESCHERICHIA COLI   Streptococcus anginosis - MIC*    PENICILLIN 0.12 SENSITIVE Sensitive     CEFTRIAXONE 0.5 SENSITIVE Sensitive     ERYTHROMYCIN <=0.12 SENSITIVE Sensitive     LEVOFLOXACIN 0.5 SENSITIVE Sensitive     VANCOMYCIN Value in next row Sensitive      0.5 SENSITIVEPerformed at [facility] [address], [place], [place] [address]    * MODERATE STREPTOCOCCUS ANGINOSIS         Radiology Studies: No results found.      Scheduled Meds: . acetaminophen  1,000 mg Oral TID  . amLODipine  5 mg Oral Daily  . aspirin  81 mg Oral Daily  . cephALEXin  500 mg Oral Q8H  . Chlorhexidine Gluconate Cloth  6 each Topical Daily  . enoxaparin (LOVENOX) injection  40 mg Subcutaneous Q24H  . feeding supplement  1 Container Oral TID BM  . lip balm  1 application Topical BID  . mouth rinse  15 mL Mouth Rinse BID  . mirabegron ER  50 mg Oral Daily  . multivitamin with minerals   1 tablet Oral Daily  . pantoprazole  40 mg Oral QHS  . psyllium  1 packet Oral BID  . rosuvastatin  10 mg Oral Daily  . sodium chloride flush  5 mL Intracatheter Q8H   Continuous Infusions:         Aline August, MD [facility] 10/10/2020, 9:47 AM

## 2020-10-11 LAB — BASIC METABOLIC PANEL
Anion gap: 10 (ref 5–15)
BUN: 7 mg/dL — ABNORMAL LOW (ref 8–23)
CO2: 32 mmol/L (ref 22–32)
Calcium: 8.6 mg/dL — ABNORMAL LOW (ref 8.9–10.3)
Chloride: 99 mmol/L (ref 98–111)
Creatinine, Ser: 0.48 mg/dL (ref 0.44–1.00)
GFR, Estimated: >60 mL/min (ref >60)
Glucose, Bld: 111 mg/dL — ABNORMAL HIGH (ref 70–99)
Potassium: 3.2 mmol/L — ABNORMAL LOW (ref 3.5–5.1)
Sodium: 141 mmol/L (ref 135–145)

## 2020-10-11 LAB — CBC
HCT: 34.5 % — ABNORMAL LOW (ref 36.0–46.0)
Hemoglobin: 10.9 g/dL — ABNORMAL LOW (ref 12.0–15.0)
MCH: 30.3 pg (ref 26.0–34.0)
MCHC: 31.6 g/dL (ref 30.0–36.0)
MCV: 95.8 fL (ref 80.0–100.0)
Platelets: 639 10*3/uL — ABNORMAL HIGH (ref 150–400)
RBC: 3.6 MIL/uL — ABNORMAL LOW (ref 3.87–5.11)
RDW: 14.5 % (ref 11.5–15.5)
WBC: 11.4 10*3/uL — ABNORMAL HIGH (ref 4.0–10.5)
nRBC: 0 % (ref 0.0–0.2)

## 2020-10-11 LAB — PREALBUMIN: Prealbumin: 12.9 mg/dL — ABNORMAL LOW (ref 18–38)

## 2020-10-11 MED ORDER — DIPHENHYDRAMINE HCL 12.5 MG/5ML PO ELIX: 12.5000 mg | ORAL_SOLUTION | Freq: Four times a day (QID) | ORAL | Status: DC | PRN

## 2020-10-11 MED ORDER — POTASSIUM CHLORIDE CRYS ER 20 MEQ PO TBCR
40.0000 meq | EXTENDED_RELEASE_TABLET | Freq: Two times a day (BID) | ORAL | Status: AC
  Administered 2020-10-11 (×2): 40 meq via ORAL
  Filled 2020-10-11 (×2): qty 2

## 2020-10-11 NOTE — Care Management Important Message (Signed)
Important Message  Patient Details IM Letter given to the Patient. Name: Molly Manning MRN: [id number] Date of Birth: 1940/11/09   Medicare Important Message Given:  Yes     Kerin Salen 10/11/2020, 4:28 PM

## 2020-10-11 NOTE — Progress Notes (Signed)
Occupational Therapy Treatment & Discharge Patient Details Name: Molly Manning MRN: [id number] DOB: Oct 10, 1941 Today's Date: 10/11/2020    History of present illness 79 y.o. female admitted with gangrenous perforated appendicitis & abscess. s/p laparoscopic appendectomy and washout 09/28/20. PMH of HLD.   OT comments  OT treatment session with focus on patient/family education on LB bathing/dressing with use of AE to maximize independence. Patient states that she would prefer her husband assist with LB bathing/dressing until her pain has subsided and does not want to use AE. Patient demonstrates Mod I with bed mobility, functional tranfers, ambulation, and toileting with good safety awareness. OT to sign off at this time. Patient in agreement.    Follow Up Recommendations  No OT follow up;Supervision - Intermittent    Equipment Recommendations  Tub/shower seat    Recommendations for Other Services      Precautions / Restrictions Precautions Precautions: Other (comment) Precaution Comments: abdominal surgery, instructed pt in log roll, 1 drain Restrictions Weight Bearing Restrictions: No       Mobility Bed Mobility Overal bed mobility: Modified Independent             General bed mobility comments: Able to log roll and sit from supine without assist.   Transfers Overall transfer level: Modified independent                    Balance Overall balance assessment: Modified Independent                                         ADL either performed or assessed with clinical judgement   ADL                                               Vision       Perception     Praxis      Cognition Arousal/Alertness: Awake/alert Behavior During Therapy: WFL for tasks assessed/performed Overall Cognitive Status: Within Functional Limits for tasks assessed                                          Exercises      Shoulder Instructions       General Comments      Pertinent Vitals/ Pain       Pain Assessment: 0-10 Pain Score: 3  Pain Location: abdomen worsening with toileting.  Pain Descriptors / Indicators: Sore Pain Intervention(s): Monitored during session;Repositioned;Relaxation;Limited activity within patient's tolerance  Home Living                                          Prior Functioning/Environment              Frequency  Min 2X/week        Progress Toward Goals  OT Goals(current goals can now be found in the care plan section)  Progress towards OT goals: Progressing toward goals  Acute Rehab OT Goals Patient Stated Goal: tennis, golf OT Goal Formulation: With patient Time For Goal Achievement: 10/14/20 Potential to Achieve Goals: Good ADL Goals Pt  Will Perform Grooming: with modified independence;sitting;standing Pt Will Perform Upper Body Dressing: with modified independence;sitting Pt Will Perform Lower Body Dressing: with modified independence;sit to/from stand;sitting/lateral leans;with adaptive equipment Pt Will Transfer to Toilet: with modified independence;ambulating Pt Will Perform Toileting - Clothing Manipulation and hygiene: with modified independence;sitting/lateral leans;sit to/from stand  Plan Discharge plan remains appropriate    Co-evaluation                 AM-PAC OT "6 Clicks" Daily Activity     Outcome Measure   Help from another person eating meals?: None Help from another person taking care of personal grooming?: None Help from another person toileting, which includes using toliet, bedpan, or urinal?: None Help from another person bathing (including washing, rinsing, drying)?: A Lot Help from another person to put on and taking off regular upper body clothing?: None Help from another person to put on and taking off regular lower body clothing?: A Lot 6 Click Score: 20    End of Session Equipment Utilized  During Treatment: Rolling walker  OT Visit Diagnosis: Pain Pain - part of body:  (Abdomen)   Activity Tolerance Patient tolerated treatment well   Patient Left in bed;with call bell/phone within reach;with family/visitor present   Nurse Communication Mobility status        Time: 8110-3159 OT Time Calculation (min): 8 min  Charges: OT General Charges $OT Visit: 1 Visit OT Treatments $Self Care/Home Management : 8-22 mins  [place] Subramaniam H. OTR/L Supplemental OT, Department of rehab services [phone]   [place] Lora R H. 10/11/2020, 10:10 AM

## 2020-10-11 NOTE — Progress Notes (Signed)
Progress Note  13 Days Post-Op  Subjective: Patient reports she had increased nausea yesterday and today. She is having some bowel function and has not vomited but just feels nauseated and sore. Afebrile. Husband at bedside.   Objective: Vital signs in last 24 hours: Temp:  [97.9 F (36.6 C)-98.3 F (36.8 C)] 98.3 F (36.8 C) (12/06 0537) Pulse Rate:  [83-90] 87 (12/06 0537) Resp:  [16-17] 16 (12/06 0537) BP: (128-143)/(63-71) 143/71 (12/06 0537) SpO2:  [90 %-100 %] 92 % (12/06 0537) Weight:  [69 kg] 69 kg (12/06 0500) Last BM Date: 10/11/20  Intake/Output from previous day: 12/05 0701 - 12/06 0700 In: 1180 [P.O.:1170] Out: 33 [Drains:33] Intake/Output this shift: No intake/output data recorded.  PE: General: pleasant, WD, elderly female who is laying in bed in NAD HEENT: Sclera are anicteric.  PERRL.  Ears and nose without any masses or lesions.  Mouth is pink and moist Heart: regular, rate, and rhythm.  Normal s1,s2. No obvious murmurs, gallops, or rubs noted.  Palpable radial and pedal pulses bilaterally Lungs: CTAB, no wheezes, rhonchi, or rales noted.  Respiratory effort nonlabored Abd: soft, mildly ttp in RLQ, ND, +BS, incisions c/d/i, drain with lacteal appearing fluid MS: all 4 extremities are symmetrical with no cyanosis, clubbing, or edema. Skin: warm and dry with no masses, lesions, or rashes Neuro: Cranial nerves 2-12 grossly intact, sensation is normal throughout Psych: A&Ox3 with an appropriate affect.    Lab Results:  Recent Labs    10/09/20 0435  WBC 8.7  HGB 10.4*  HCT 33.0*  PLT 475*   BMET No results for input(s): NA, K, CL, CO2, GLUCOSE, BUN, CREATININE, CALCIUM in the last 72 hours. PT/INR No results for input(s): LABPROT, INR in the last 72 hours. CMP     Component Value Date/Time   NA 138 10/06/2020 0356   K 3.7 10/06/2020 0356   CL 101 10/06/2020 0356   CO2 23 10/06/2020 0356   GLUCOSE 89 10/06/2020 0356   BUN 7 (L) 10/06/2020  0356   CREATININE 0.52 10/06/2020 0356   CALCIUM 8.0 (L) 10/06/2020 0356   PROT 5.7 (L) 10/05/2020 0355   ALBUMIN 2.0 (L) 10/05/2020 0355   AST 23 10/05/2020 0355   ALT 18 10/05/2020 0355   ALKPHOS 100 10/05/2020 0355   BILITOT 1.3 (H) 10/05/2020 0355   GFRNONAA >60 10/06/2020 0356   Lipase     Component Value Date/Time   LIPASE 25 09/28/2020 0850       Studies/Results: No results found.  Anti-infectives: Anti-infectives (From admission, onward)   Start     Dose/Rate Route Frequency Ordered Stop   10/08/20 2300  cephALEXin (KEFLEX) capsule 500 mg        500 mg Oral Every 8 hours 10/08/20 2204     10/08/20 1800  amoxicillin-clavulanate (AUGMENTIN) 875-125 MG per tablet 1 tablet  Status:  Discontinued        1 tablet Oral Every 12 hours 10/08/20 1015 10/08/20 2204   10/02/20 1000  anidulafungin (ERAXIS) 100 mg in sodium chloride 0.9 % 100 mL IVPB        100 mg 78 mL/hr over 100 Minutes Intravenous Every 24 hours 10/01/20 0958 10/07/20 1248   10/01/20 1100  anidulafungin (ERAXIS) 200 mg in sodium chloride 0.9 % 200 mL IVPB        200 mg 78 mL/hr over 200 Minutes Intravenous  Once 10/01/20 1014 10/01/20 1638   09/29/20 2200  piperacillin-tazobactam (ZOSYN) IVPB 3.375 g  3.375 g 12.5 mL/hr over 240 Minutes Intravenous Every 8 hours 09/29/20 1524 10/08/20 1604   09/28/20 2000  piperacillin-tazobactam (ZOSYN) IVPB 3.375 g  Status:  Discontinued        3.375 g 12.5 mL/hr over 240 Minutes Intravenous Every 8 hours 09/28/20 1937 09/29/20 1524   09/28/20 1830  clindamycin (CLEOCIN) 900 mg, gentamicin (GARAMYCIN) 240 mg in sodium chloride 0.9 % 1,000 mL for intraperitoneal lavage         Irrigation To Surgery 09/28/20 1736 09/28/20 1806   09/28/20 1630  clindamycin (CLEOCIN) 900 mg, gentamicin (GARAMYCIN) 240 mg in sodium chloride 0.9 % 1,000 mL for intraperitoneal lavage  Status:  Discontinued       Note to Pharmacy: Have in the  [place] room for final irrigation in bowel surgery  case to minimize risk of abscess/infection Pharmacy may adjust dosing strength, schedule, rate of infusion, etc as needed to optimize therapy    Irrigation To Surgery 09/28/20 1618 09/28/20 1649   09/28/20 1600  cefoTEtan (CEFOTAN) 2 g in sodium chloride 0.9 % 100 mL IVPB        2 g 200 mL/hr over 30 Minutes Intravenous On call to O.R. 09/28/20 1320 09/28/20 1605   09/28/20 1145  piperacillin-tazobactam (ZOSYN) IVPB 3.375 g        3.375 g 100 mL/hr over 30 Minutes Intravenous  Once 09/28/20 1144 09/28/20 1353       Assessment/Plan HLD OAB AKI -improved ABL anemia- stable Severe protein calorie malnutrition - prealbumin7.2(12/1), recheck today Rash - looks like contact dermatitis, cont hydrocortisone cream and benadryl prn   Abdominal pain/free air Gangrenous perforated appendicitis, intra-abdominal and retroperitoneal abscesses, feculent peritonitis, S/pLAPAROSCOPIC DRAINAGE OF ABSESS X 2,LAPAROSCOPIC APPENDECTOMY,DIAGNOSTIC LAPAROSCOPYWITH ABDOMINAL WASHOUT11/23 Dr. Johney Maine - POD#[id number] - surgical path -acute appendicitis -both JP were removed11/29 -IR drain on 11/30 for abscess - 30 cc out in last 24h -WBCnormalized now, afebrile.  Drain cultures show strep anginosis. -cont soft diet today - continue to mobilize as tolerated - still with a lot of nausea today, repeat labs - will need IR drain clinic follow up and teaching on drain care prior to DC  ID -zosyn11/23>12/2, keflex 12/3 >> VTE -SCDs, lovenox  FEN -SLIV,soft diet, resource breeze Foley -removed 11/24 Follow up -Dr. Johney Maine  LOS: 13 days    Norm Parcel , [facility] 10/11/2020, 8:44 AM Please see Amion for pager number during day hours 7:00am-4:30pm

## 2020-10-11 NOTE — Progress Notes (Signed)
Referring Physician(s):Martin,M  Supervising Physician: Dr. Kathlene Cote  Patient Status:  [facility] - In-pt  Chief Complaint: Abdominal pain/left upper abdominal abscess   Subjective: Patient feels okay. Husband at bedside. Anxious to get home. No new complaints   Allergies: Fosamax [alendronate]  Medications:  Current Facility-Administered Medications:  .  acetaminophen (TYLENOL) tablet 1,000 mg, 1,000 mg, Oral, TID, Michael Boston, MD, 1,000 mg at 10/11/20 0953 .  albuterol (PROVENTIL) (2.5 MG/3ML) 0.083% nebulizer solution 2.5 mg, 2.5 mg, Nebulization, Q6H PRN, Michael Boston, MD .  amLODipine (NORVASC) tablet 5 mg, 5 mg, Oral, Daily, Starla Link, Kshitiz, MD, 5 mg at 10/11/20 0953 .  aspirin chewable tablet 81 mg, 81 mg, Oral, Daily, Elodia Florence., MD, 81 mg at 10/11/20 [phone]  cephALEXin (KEFLEX) capsule 500 mg, 500 mg, Oral, W6O, Leighton Ruff, MD, 035 mg at 10/11/20 1230 .  Chlorhexidine Gluconate Cloth 2 % PADS 6 each, 6 each, Topical, Daily, Johnathan Hausen, MD, 6 each at 10/11/20 [phone] .  diphenhydrAMINE (BENADRYL) 12.5 MG/5ML elixir 12.5 mg, 12.5 mg, Oral, B6L PRN, Leighton Ruff, MD .  enalaprilat (VASOTEC) injection 0.625-1.25 mg, 0.625-1.25 mg, Intravenous, Q6H PRN, Michael Boston, MD .  enoxaparin (LOVENOX) injection 40 mg, 40 mg, Subcutaneous, Q24H, Michael Boston, MD, 40 mg at 10/11/20 0809 .  feeding supplement (BOOST / RESOURCE BREEZE) liquid 1 Container, 1 Container, Oral, TID BM, Saverio Danker, PA-C, 1 Container at 10/11/20 [phone] .  fentaNYL (SUBLIMAZE) injection, , Intravenous, PRN, Corrie Mckusick, DO, 50 mcg at 10/05/20 1543 .  hydrocortisone cream 1 %, , Topical, BID, Leighton Ruff, MD, Given at 64/68/03 [phone] .  HYDROmorphone (DILAUDID) injection 0.5-1 mg, 0.5-1 mg, Intravenous, Q3H PRN, Earnstine Regal, PA-C .  lidocaine (PF) (XYLOCAINE) 1 % injection, , , PRN, Corrie Mckusick, DO, 10 mL at 10/05/20 1547 .  lip balm (CARMEX) ointment 1 application, 1  application, Topical, BID, Michael Boston, MD, 1 application at 48/25/00 [phone] .  MEDLINE mouth rinse, 15 mL, Mouth Rinse, BID, Michael Boston, MD, 15 mL at 10/11/20 0953 .  methocarbamol (ROBAXIN) tablet 1,000 mg, 1,000 mg, Oral, Q6H PRN, Michael Boston, MD, 1,000 mg at 10/03/20 2049 .  metoprolol tartrate (LOPRESSOR) injection 5 mg, 5 mg, Intravenous, Q6H PRN, Michael Boston, MD .  mirabegron ER [facility]) tablet 50 mg, 50 mg, Oral, Daily, Elodia Florence., MD, 50 mg at 10/11/20 [phone] .  multivitamin with minerals tablet 1 tablet, 1 tablet, Oral, Daily, Michael Boston, MD, 1 tablet at 10/11/20 0953 .  ondansetron (ZOFRAN) injection 4 mg, 4 mg, Intravenous, Q6H PRN, 4 mg at 10/11/20 0957 **OR** [DISCONTINUED] ondansetron (ZOFRAN) 8 mg in sodium chloride 0.9 % 50 mL IVPB, 8 mg, Intravenous, Q6H PRN, Michael Boston, MD .  pantoprazole (PROTONIX) EC tablet 40 mg, 40 mg, Oral, QHS, Alekh, Kshitiz, MD, 40 mg at 10/10/20 2220 .  phenol (CHLORASEPTIC) mouth spray 1 spray, 1 spray, Mouth/Throat, Q2H PRN, Michael Boston, MD, 1 spray at 09/29/20 1029 .  potassium chloride SA (KLOR-CON) CR tablet 40 mEq, 40 mEq, Oral, BID, Norm Parcel, PA-C, 40 mEq at 10/11/20 1230 .  prochlorperazine (COMPAZINE) injection 5-10 mg, 5-10 mg, Intravenous, Q4H PRN, Michael Boston, MD .  psyllium (HYDROCIL/METAMUCIL) 1 packet, 1 packet, Oral, BID, Michael Boston, MD, 1 packet at 10/10/20 2220 .  rosuvastatin (CRESTOR) tablet 10 mg, 10 mg, Oral, Daily, Elodia Florence., MD, 10 mg at 10/11/20 0953 .  simethicone (MYLICON) chewable tablet 40 mg, 40 mg, Oral, Q6H PRN, Gross,  Remo Lipps, MD .  sodium chloride flush (NS) 0.9 % injection 5 mL, 5 mL, Intracatheter, Q8H, Wagner, Jaime, DO, 5 mL at 10/11/20 1231 .  traMADol (ULTRAM) tablet 50-100 mg, 50-100 mg, Oral, Q6H PRN, Michael Boston, MD, 50 mg at 10/10/20 0528    Vital Signs: BP (!) 143/71 (BP Location: Right Arm)   Pulse 87   Temp 98.3 F (36.8 C) (Oral)   Resp 16   Ht  5\' 6"  (1.676 m)   Wt 69 kg   SpO2 92%   BMI 24.55 kg/m   Physical Exam awake, alert.  Left abdominal drain intact, insertion site okay, not significantly tender to palpation Output still hazy serous   Imaging: No results found.  Labs:  CBC: Recent Labs    10/07/20 0333 10/08/20 0321 10/09/20 0435 10/11/20 0856  WBC 12.3* 11.9* 8.7 11.4*  HGB 10.1* 10.0* 10.4* 10.9*  HCT 31.4* 31.2* 33.0* 34.5*  PLT 478* 476* 475* 639*    COAGS: No results for input(s): INR, APTT in the last 8760 hours.  BMP: Recent Labs    10/04/20 0233 10/05/20 0355 10/06/20 0356 10/11/20 0856  NA 139 140 138 141  K 4.4 3.7 3.7 3.2*  CL 101 101 101 99  CO2 29 26 23  32  GLUCOSE 99 80 89 111*  BUN 14 9 7* 7*  CALCIUM 8.0* 8.1* 8.0* 8.6*  CREATININE 0.54 0.57 0.52 0.48  GFRNONAA >60 >60 >60 >60    LIVER FUNCTION TESTS: Recent Labs    10/02/20 0249 10/03/20 0421 10/04/20 0233 10/05/20 0355  BILITOT 0.9 1.2 1.0 1.3*  AST 31 27 22 23   ALT 33 25 19 18   ALKPHOS 114 97 85 100  PROT 5.6* 5.6* 5.5* 5.7*  ALBUMIN 2.0* 2.0* 1.9* 2.0*    Assessment and Plan: Patient with history of gangrenous perforated appendicitis with associated intra-abdominal/retroperitoneal abscesses/feculent peritonitis, status post laparoscopic drainage of abscess x2,appendectomy ,laparoscopy with abdominal washout on 11/23;status post drainage of left upper quadrant fluid collection on11/30 Has been 1 week since drain placement, may consider repeat CT prior to discharge to reassess. Recommend once daily irrigation of drain with 5 cc sterile saline, output recording and dressing changes every 1 to 2 days; patient instructed on how to do drain flushes; prescription for saline flushes given to patient as well as output recording card; she will be scheduled for follow-up in IR drain clinic in 1-2 weeks   Electronically Signed: Ascencion Dike, PA-C 10/11/2020, 1:34 PM   I spent a total of 15 minutes at the the  patient's bedside AND on the patient's hospital floor or unit, greater than 50% of which was counseling/coordinating care for left abdominal abscess drain

## 2020-10-11 NOTE — Progress Notes (Signed)
PHARMACIST - PHYSICIAN COMMUNICATION  DR:   CCS CONCERNING: IV to Oral Route Change Policy  RECOMMENDATION: This patient is receiving diphenhydramine by the intravenous route.  Based on criteria approved by the Pharmacy and Therapeutics Committee, intravenous diphenhydramine is being converted to the equivalent oral dose form(s).   DESCRIPTION: These criteria include:  Diphenhydramine is not prescribed to treat or prevent a severe allergic reaction  Diphenhydramine is not prescribed as premedication prior to receiving blood product, biologic medication, antimicrobial, or chemotherapy agent  The patient has tolerated at least one dose of an oral or enteral medication  The patient has no evidence of active gastrointestinal bleeding or impaired GI absorption (gastrectomy, short bowel, patient on TNA or NPO).  The patient is not undergoing procedural sedation   If you have questions about this conversion, please contact the Pharmacy Department  [phone]  Peggyann Juba, PharmD, BCPS

## 2020-10-12 ENCOUNTER — Other Ambulatory Visit: Payer: Self-pay | Admitting: Radiology

## 2020-10-12 DIAGNOSIS — K3521 Acute appendicitis with generalized peritonitis, with abscess: Secondary | ICD-10-CM

## 2020-10-12 LAB — CBC
HCT: 30.5 % — ABNORMAL LOW (ref 36.0–46.0)
Hemoglobin: 9.7 g/dL — ABNORMAL LOW (ref 12.0–15.0)
MCH: 30.7 pg (ref 26.0–34.0)
MCHC: 31.8 g/dL (ref 30.0–36.0)
MCV: 96.5 fL (ref 80.0–100.0)
Platelets: 539 10*3/uL — ABNORMAL HIGH (ref 150–400)
RBC: 3.16 MIL/uL — ABNORMAL LOW (ref 3.87–5.11)
RDW: 14.4 % (ref 11.5–15.5)
WBC: 8 10*3/uL (ref 4.0–10.5)
nRBC: 0 % (ref 0.0–0.2)

## 2020-10-12 LAB — BASIC METABOLIC PANEL
Anion gap: 9 (ref 5–15)
BUN: 11 mg/dL (ref 8–23)
CO2: 31 mmol/L (ref 22–32)
Calcium: 8.5 mg/dL — ABNORMAL LOW (ref 8.9–10.3)
Chloride: 103 mmol/L (ref 98–111)
Creatinine, Ser: 0.47 mg/dL (ref 0.44–1.00)
GFR, Estimated: >60 mL/min (ref >60)
Glucose, Bld: 107 mg/dL — ABNORMAL HIGH (ref 70–99)
Potassium: 3.9 mmol/L (ref 3.5–5.1)
Sodium: 143 mmol/L (ref 135–145)

## 2020-10-12 MED ORDER — ONDANSETRON HCL 4 MG PO TABS: 4.0000 mg | ORAL_TABLET | Freq: Three times a day (TID) | ORAL | 1 refills | Status: AC | PRN

## 2020-10-12 MED ORDER — ACETAMINOPHEN 500 MG PO TABS: 1000.0000 mg | ORAL_TABLET | Freq: Three times a day (TID) | ORAL | Status: AC | PRN

## 2020-10-12 MED ORDER — TRAMADOL HCL 50 MG PO TABS: 50.0000 mg | ORAL_TABLET | Freq: Four times a day (QID) | ORAL | 0 refills | Status: AC | PRN

## 2020-10-12 MED ORDER — HYDROCORTISONE 1 % EX CREA: TOPICAL_CREAM | Freq: Two times a day (BID) | CUTANEOUS | Status: AC | PRN

## 2020-10-12 NOTE — Discharge Instructions (Signed)
SURGERY: POST OP INSTRUCTIONS (Surgery for small bowel obstruction, colon resection, etc)   ######################################################################  EAT Gradually transition to a high fiber diet with a fiber supplement over the next few days after discharge  WALK Walk an hour a day.  Control your pain to do that.    CONTROL PAIN Control pain so that you can walk, sleep, tolerate sneezing/coughing, go up/down stairs.  HAVE A BOWEL MOVEMENT DAILY Keep your bowels regular to avoid problems.  OK to try a laxative to override constipation.  OK to use an antidairrheal to slow down diarrhea.  Call if not better after 2 tries  CALL IF YOU HAVE PROBLEMS/CONCERNS Call if you are still struggling despite following these instructions. Call if you have concerns not answered by these instructions  ######################################################################   DIET Follow a light diet the first few days at home.  Start with a bland diet such as soups, liquids, starchy foods, low fat foods, etc.  If you feel full, bloated, or constipated, stay on a ful liquid or pureed/blenderized diet for a few days until you feel better and no longer constipated. Be sure to drink plenty of fluids every day to avoid getting dehydrated (feeling dizzy, not urinating, etc.). Gradually add a fiber supplement to your diet over the next week.  Gradually get back to a regular solid diet.  Avoid fast food or heavy meals the first week as you are more likely to get nauseated. It is expected for your digestive tract to need a few months to get back to normal.  It is common for your bowel movements and stools to be irregular.  You will have occasional bloating and cramping that should eventually fade away.  Until you are eating solid food normally, off all pain medications, and back to regular activities; your bowels will not be normal. Focus on eating a low-fat, high fiber diet the rest of your life  (See Getting to Good Bowel Health, below).  CARE of your INCISION or WOUND It is good for closed incision and even open wounds to be washed every day.  Shower every day.  Short baths are fine.  Wash the incisions and wounds clean with soap & water.    If you have a closed incision(s), wash the incision with soap & water every day.  You may leave closed incisions open to air if it is dry.   You may cover the incision with clean gauze & replace it after your daily shower for comfort. If you have skin tapes (Steristrips) or skin glue (Dermabond) on your incision, leave them in place.  They will fall off on their own like a scab.  You may trim any edges that curl up with clean scissors.  If you have staples, set up an appointment for them to be removed in the office in 10 days after surgery.  If you have a drain, wash around the skin exit site with soap & water and place a new dressing of gauze or band aid around the skin every day.  Keep the drain site clean & dry.    If you have an open wound with packing, see wound care instructions.  In general, it is encouraged that you remove your dressing and packing, shower with soap & water, and replace your dressing once a day.  Pack the wound with clean gauze moistened with normal (0.9%) saline to keep the wound moist & uninfected.  Pressure on the dressing for 30 minutes will stop most wound   bleeding.  Eventually your body will heal & pull the open wound closed over the next few months.  Raw open wounds will occasionally bleed or secrete yellow drainage until it heals closed.  Drain sites will drain a little until the drain is removed.  Even closed incisions can have mild bleeding or drainage the first few days until the skin edges scab over & seal.   If you have an open wound with a wound vac, see wound vac care instructions.     ACTIVITIES as tolerated Start light daily activities --- self-care, walking, climbing stairs-- beginning the day after surgery.   Gradually increase activities as tolerated.  Control your pain to be active.  Stop when you are tired.  Ideally, walk several times a day, eventually an hour a day.   Most people are back to most day-to-day activities in a few weeks.  It takes 4-8 weeks to get back to unrestricted, intense activity. If you can walk 30 minutes without difficulty, it is safe to try more intense activity such as jogging, treadmill, bicycling, low-impact aerobics, swimming, etc. Save the most intensive and strenuous activity for last (Usually 4-8 weeks after surgery) such as sit-ups, heavy lifting, contact sports, etc.  Refrain from any intense heavy lifting or straining until you are off narcotics for pain control.  You will have off days, but things should improve week-by-week. DO NOT PUSH THROUGH PAIN.  Let pain be your guide: If it hurts to do something, don't do it.  Pain is your body warning you to avoid that activity for another week until the pain goes down. You may drive when you are no longer taking narcotic prescription pain medication, you can comfortably wear a seatbelt, and you can safely make sudden turns/stops to protect yourself without hesitating due to pain. You may have sexual intercourse when it is comfortable. If it hurts to do something, stop.  MEDICATIONS Take your usually prescribed home medications unless otherwise directed.   Blood thinners:  Usually you can restart any strong blood thinners after the second postoperative day.  It is OK to take aspirin right away.     If you are on strong blood thinners (warfarin/Coumadin, Plavix, Xerelto, Eliquis, Pradaxa, etc), discuss with your surgeon, medicine PCP, and/or cardiologist for instructions on when to restart the blood thinner & if blood monitoring is needed (PT/INR blood check, etc).     PAIN CONTROL Pain after surgery or related to activity is often due to strain/injury to muscle, tendon, nerves and/or incisions.  This pain is usually  short-term and will improve in a few months.  To help speed the process of healing and to get back to regular activity more quickly, DO THE FOLLOWING THINGS TOGETHER: 1. Increase activity gradually.  DO NOT PUSH THROUGH PAIN 2. Use Ice and/or Heat 3. Try Gentle Massage and/or Stretching 4. Take over the counter pain medication 5. Take Narcotic prescription pain medication for more severe pain  Good pain control = faster recovery.  It is better to take more medicine to be more active than to stay in bed all day to avoid medications. 1.  Increase activity gradually Avoid heavy lifting at first, then increase to lifting as tolerated over the next 6 weeks. Do not "push through" the pain.  Listen to your body and avoid positions and maneuvers than reproduce the pain.  Wait a few days before trying something more intense Walking an hour a day is encouraged to help your body recover faster   and more safely.  Start slowly and stop when getting sore.  If you can walk 30 minutes without stopping or pain, you can try more intense activity (running, jogging, aerobics, cycling, swimming, treadmill, sex, sports, weightlifting, etc.) Remember: If it hurts to do it, then don't do it! 2. Use Ice and/or Heat You will have swelling and bruising around the incisions.  This will take several weeks to resolve. Ice packs or heating pads (6-8 times a day, 30-60 minutes at a time) will help sooth soreness & bruising. Some people prefer to use ice alone, heat alone, or alternate between ice & heat.  Experiment and see what works best for you.  Consider trying ice for the first few days to help decrease swelling and bruising; then, switch to heat to help relax sore spots and speed recovery. Shower every day.  Short baths are fine.  It feels good!  Keep the incisions and wounds clean with soap & water.   3. Try Gentle Massage and/or Stretching Massage at the area of pain many times a day Stop if you feel pain - do not  overdo it 4. Take over the counter pain medication This helps the muscle and nerve tissues become less irritable and calm down faster Choose ONE of the following over-the-counter anti-inflammatory medications: Acetaminophen 500mg tabs (Tylenol) 1-2 pills with every meal and just before bedtime (avoid if you have liver problems or if you have acetaminophen in you narcotic prescription) Naproxen 220mg tabs (ex. Aleve, Naprosyn) 1-2 pills twice a day (avoid if you have kidney, stomach, IBD, or bleeding problems) Ibuprofen 200mg tabs (ex. Advil, Motrin) 3-4 pills with every meal and just before bedtime (avoid if you have kidney, stomach, IBD, or bleeding problems) Take with food/snack several times a day as directed for at least 2 weeks to help keep pain / soreness down & more manageable. 5. Take Narcotic prescription pain medication for more severe pain A prescription for strong pain control is often given to you upon discharge (for example: oxycodone/Percocet, hydrocodone/Norco/Vicodin, or tramadol/Ultram) Take your pain medication as prescribed. Be mindful that most narcotic prescriptions contain Tylenol (acetaminophen) as well - avoid taking too much Tylenol. If you are having problems/concerns with the prescription medicine (does not control pain, nausea, vomiting, rash, itching, etc.), please call us [phone] to see if we need to switch you to a different pain medicine that will work better for you and/or control your side effects better. If you need a refill on your pain medication, you must call the office before 4 pm and on weekdays only.  By federal law, prescriptions for narcotics cannot be called into a pharmacy.  They must be filled out on paper & picked up from our office by the patient or authorized caretaker.  Prescriptions cannot be filled after 4 pm nor on weekends.    WHEN TO CALL US [phone] Severe uncontrolled or worsening pain  Fever over 101 F (38.5 C) Concerns with  the incision: Worsening pain, redness, rash/hives, swelling, bleeding, or drainage Reactions / problems with new medications (itching, rash, hives, nausea, etc.) Nausea and/or vomiting Difficulty urinating Difficulty breathing Worsening fatigue, dizziness, lightheadedness, blurred vision Other concerns If you are not getting better after two weeks or are noticing you are getting worse, contact our office [phone] for further advice.  We may need to adjust your medications, re-evaluate you in the office, send you to the emergency room, or see what other things we can do to help. The   clinic staff is available to answer your questions during regular business hours (8:30am-5pm).  Please don't hesitate to call and ask to speak to one of our nurses for clinical concerns.    A surgeon from [facility] is always on call at the hospitals 24 hours/day If you have a medical emergency, go to the nearest emergency room or call 911.  FOLLOW UP in our office One the day of your discharge from the hospital (or the next business weekday), please call [facility] to set up or confirm an appointment to see your surgeon in the office for a follow-up appointment.  Usually it is 2-3 weeks after your surgery.   If you have skin staples at your incision(s), let the office know so we can set up a time in the office for the nurse to remove them (usually around 10 days after surgery). Make sure that you call for appointments the day of discharge (or the next business weekday) from the hospital to ensure a convenient appointment time. IF YOU HAVE DISABILITY OR FAMILY LEAVE FORMS, BRING THEM TO THE OFFICE FOR PROCESSING.  DO NOT GIVE THEM TO YOUR DOCTOR.  [facility] [address], [address], , [place]  [address] ? [phone] - Main [phone] - Toll Free,  [phone] - Fax [email or web address]  GETTING TO GOOD BOWEL HEALTH. It is  expected for your digestive tract to need a few months to get back to normal.  It is common for your bowel movements and stools to be irregular.  You will have occasional bloating and cramping that should eventually fade away.  Until you are eating solid food normally, off all pain medications, and back to regular activities; your bowels will not be normal.   Avoiding constipation The goal: ONE SOFT BOWEL MOVEMENT A DAY!    Drink plenty of fluids.  Choose water first. TAKE A FIBER SUPPLEMENT EVERY DAY THE REST OF YOUR LIFE During your first week back home, gradually add back a fiber supplement every day Experiment which form you can tolerate.   There are many forms such as powders, tablets, wafers, gummies, etc Psyllium bran (Metamucil), methylcellulose (Citrucel), Miralax or Glycolax, Benefiber, Flax Seed.  Adjust the dose week-by-week (1/2 dose/day to 6 doses a day) until you are moving your bowels 1-2 times a day.  Cut back the dose or try a different fiber product if it is giving you problems such as diarrhea or bloating. Sometimes a laxative is needed to help jump-start bowels if constipated until the fiber supplement can help regulate your bowels.  If you are tolerating eating & you are farting, it is okay to try a gentle laxative such as double dose MiraLax, prune juice, or Milk of Magnesia.  Avoid using laxatives too often. Stool softeners can sometimes help counteract the constipating effects of narcotic pain medicines.  It can also cause diarrhea, so avoid using for too long. If you are still constipated despite taking fiber daily, eating solids, and a few doses of laxatives, call our office. Controlling diarrhea Try drinking liquids and eating bland foods for a few days to avoid stressing your intestines further. Avoid dairy products (especially milk & ice cream) for a short time.  The intestines often can lose the ability to digest lactose when stressed. Avoid foods that cause gassiness or  bloating.  Typical foods include beans and other legumes, cabbage, broccoli, and dairy foods.  Avoid greasy, spicy, fast foods.  Every person has   some sensitivity to other foods, so listen to your body and avoid those foods that trigger problems for you. Probiotics (such as active yogurt, Align, etc) may help repopulate the intestines and colon with normal bacteria and calm down a sensitive digestive tract Adding a fiber supplement gradually can help thicken stools by absorbing excess fluid and retrain the intestines to act more normally.  Slowly increase the dose over a few weeks.  Too much fiber too soon can backfire and cause cramping & bloating. It is okay to try and slow down diarrhea with a few doses of antidiarrheal medicines.   Bismuth subsalicylate (ex. Kayopectate, Pepto Bismol) for a few doses can help control diarrhea.  Avoid if pregnant.   Loperamide (Imodium) can slow down diarrhea.  Start with one tablet (2mg ) first.  Avoid if you are having fevers or severe pain.   TROUBLESHOOTING IRREGULAR BOWELS 1) Start with a soft & bland diet. No spicy, greasy, or fried foods.  2) Avoid gluten/wheat or dairy products from diet to see if symptoms improve. 3) Miralax 17gm or flax seed mixed in [place]. water or juice-daily. May use 2-4 times a day as needed. 4) Gas-X, Phazyme, etc. as needed for gas & bloating.  5) Prilosec (omeprazole) over-the-counter as needed 6)  Consider probiotics (Align, Activa, etc) to help calm the bowels down  Call your doctor if you are getting worse or not getting better.  Sometimes further testing (cultures, endoscopy, X-ray studies, CT scans, bloodwork, etc.) may be needed to help diagnose and treat the cause of the diarrhea. [facility], [address], [place], [place], [place]  [address] [phone] - Main.    [phone]  - Toll Free.   [phone] - Fax [email or web address]   Percutaneous Abscess Drain, Care After This  sheet gives you information about how to care for yourself after your procedure. Your health care provider may also give you more specific instructions. If you have problems or questions, contact your health care provider. What can I expect after the procedure? After your procedure, it is common to have:  A small amount of bruising and discomfort in the area where the drainage tube (catheter) was placed.  Sleepiness and fatigue. This should go away after the medicines you were given have worn off. Follow these instructions at home: Incision care  Follow instructions from your health care provider about how to take care of your incision. Make sure you: ? Wash your hands with soap and water before you change your bandage (dressing). If soap and water are not available, use hand sanitizer. ? Change your dressing as told by your health care provider. ? Leave stitches (sutures), skin glue, or adhesive strips in place. These skin closures may need to stay in place for 2 weeks or longer. If adhesive strip edges start to loosen and curl up, you may trim the loose edges. Do not remove adhesive strips completely unless your health care provider tells you to do that.  Check your incision area every day for signs of infection. Check for: ? More redness, swelling, or pain. ? More fluid or blood. ? Warmth. ? Pus or a bad smell. ? Fluid leaking from around your catheter (instead of fluid draining through your catheter). Catheter care   Follow instructions from your health care provider about emptying and cleaning your catheter and collection bag. You may need to clean the catheter every day so it does not clog.  If directed, write down the following  information every time you empty your bag: ? The date and time. ? The amount of drainage. General instructions  Rest at home for 1-2 days after your procedure. Return to your normal activities as told by your health care provider.  Do not take baths,  swim, or use a hot tub for 24 hours after your procedure, or until your health care provider says that this is okay.  Take over-the-counter and prescription medicines only as told by your health care provider.  Keep all follow-up visits as told by your health care provider. This is important. Contact a health care provider if:  You have less than 10 mL of drainage a day for 2-3 days in a row, or as directed by your health care provider.  You have more redness, swelling, or pain around your incision area.  You have more fluid or blood coming from your incision area.  Your incision area feels warm to the touch.  You have pus or a bad smell coming from your incision area.  You have fluid leaking from around your catheter (instead of through your catheter).  You have a fever or chills.  You have pain that does not get better with medicine. Get help right away if:  Your catheter comes out.  You suddenly stop having drainage from your catheter.  You suddenly have blood in the fluid that is draining from your catheter.  You become dizzy or you faint.  You develop a rash.  You have nausea or vomiting.  You have difficulty breathing or you feel short of breath.  You develop chest pain.  You have problems with your speech or vision.  You have trouble balancing or moving your arms or legs. Summary  It is common to have a small amount of bruising and discomfort in the area where the drainage tube (catheter) was placed.  You may be directed to record the amount of drainage from the bag every time you empty it.  Follow instructions from your health care provider about emptying and cleaning your catheter and collection bag. This information is not intended to replace advice given to you by your health care provider. Make sure you discuss any questions you have with your health care provider. Document Revised: 10/05/2017 Document Reviewed: 09/14/2016 Elsevier Patient Education   2020 [employer] Empty your surgical drain as told by your health care provider. Use this form to write down the amount of fluid that has collected in the drainage container. Bring this form with you to your follow-up visits.   Date __________ Time __________ Amount __________ Date __________ Time __________ Amount __________ Date __________ Time __________ Amount __________ Date __________ Time __________ Amount __________ Date __________ Time __________ Amount __________ Date __________ Time __________ Amount __________ Date __________ Time __________ Amount __________ Date __________ Time __________ Amount __________ Date __________ Time __________ Amount __________ Date __________ Time __________ Amount __________ Date __________ Time __________ Amount __________ Date __________ Time __________ Amount __________ Date __________ Time __________ Amount __________ Date __________ Time __________ Amount __________ Date __________ Time __________ Amount __________ Date __________ Time __________ Amount __________ Date __________ Time __________ Amount __________ Date __________ Time __________ Amount __________ Date __________ Time __________ Amount __________ Date __________ Time __________ Amount __________ Date __________ Time __________ Amount __________ Date __________ Time __________ Amount __________ Date __________ Time __________ Amount __________ Date __________ Time __________ Amount __________ Date __________ Time __________ Amount __________ Date __________ Time __________ Amount __________ Date __________  Time __________ Amount __________ Date __________ Time __________ Amount __________ Date __________ Time __________ Amount __________ Date __________ Time __________ Amount __________ Date __________ Time __________ Amount __________ Date __________ Time __________ Amount __________ Date __________ Time __________ Amount __________ Date  __________ Time __________ Amount __________ Date __________ Time __________ Amount __________ Date __________ Time __________ Amount __________ Date __________ Time __________ Amount __________ Date __________ Time __________ Amount __________ Date __________ Time __________ Amount __________ Date __________ Time __________ Amount __________ Date __________ Time __________ Amount __________ Date __________ Time __________ Amount __________ This information is not intended to replace advice given to you by your health care provider. Make sure you discuss any questions you have with your health care provider. Document Revised: 07/30/2017 Document Reviewed: 07/30/2017 Elsevier Patient Education  2020 [employer].

## 2020-10-12 NOTE — Progress Notes (Addendum)
Patient ID: Molly Manning, female   DOB: 1941-06-04, 79 y.o.   MRN: [id number]  PROGRESS NOTE    Molly Manning  IFO:[id number] DOB: 11/12/1940 DOA: 09/28/2020 PCP: Deland Pretty, MD   Brief Narrative:  79 y.o.femalewith medical history significant ofHLD, overactive bladder presented on 09/28/2020 with abdominal pain and was found to have perforated appendicitis on CT scan.  She was admitted under general surgery service and she underwent laparoscopic appendectomy with drainage of abscess and abdominal washout by general surgery.  [facility] was consulted for medical management.  Assessment & Plan:   Sepsis: Present on admission Gangrenous perforated appendicitis Intra-abdominal and retroperitoneal abscess Feculent peritonitis -Status post laparoscopic drainage of abscess x2, laparoscopic appendectomy, diagnostic laparoscopy with abdominal washout on 09/28/2020 -Care as per primary team: NG tube removed on 10/02/2020.  Appetite poor but improving. -CT of the abdomen and pelvis showed worsening peritoneal fluid collections consistent with abscesses: Status post IR guided drain placement on 10/05/2020: Cultures grew moderate Streptococcus anginosis and E. coli.  Antibiotics as per primary surgical team -Sepsis has currently resolved. -Primary team is possibly planning for to discharge the patient today.  Patient is currently medically stable for discharge.  Outpatient follow-up with PCP.  Acute hypoxic respiratory failure Community-acquired pneumonia -Chest x-ray from 09/28/2020 showed patchy airspace opacities within the left midlung and lung base concerning for pneumonia.  Repeat chest x-ray on 09/30/2020 showed persistent small effusions with decreased aerations of both lower lung zones - bandlike atelectasis within L midlung -Respiratory status has improved: Currently on room air -Continue incentive spirometry.    Leukocytosis -Resolved  RUE Edema - RUE [place] negative for DVT.  Most likely  secondary to IV infiltration  Elevated LFTs - resolved  Normocytic Anemia  Iron Def Anemia       - likely dilutional, possible component of blood loss from surgery, continue to monitor  - labs c/w iron deficiency anemia: Hemoglobin stable for now.  Consider oral iron supplementation on discharge.  Thrombocytosis -Probably reactive.  Hypertension: she's not on home BP meds.  Blood pressure still on the higher side.  Continue amlodipine at current dose.  Outpatient follow-up with PCP regarding adjustment of dose.  Hyperlipidemia -Continue Crestor  Overactive bladder -Continue myrbetriq  Subjective: Patient seen and examined at bedside.  She feels better this morning and states that her appetite is improving.  Denies any abdominal pain, fever or vomiting.  Feels that she might be able to go home today.   Objective: Vitals:   10/11/20 1419 10/11/20 2352 10/12/20 0500 10/12/20 0643  BP: 137/63 (!) 144/71  (!) 147/74  Pulse: 92 88  87  Resp: 16 17  18   Temp: 98.1 F (36.7 C) 97.9 F (36.6 C)  98.7 F (37.1 C)  TempSrc: Oral Oral  Oral  SpO2: 93% 95%  93%  Weight:   68.2 kg   Height:        Intake/Output Summary (Last 24 hours) at 10/12/2020 0929 Last data filed at 10/12/2020 6767 Gross per 24 hour  Intake 815 ml  Output 39 ml  Net 776 ml   Filed Weights   10/10/20 0500 10/11/20 0500 10/12/20 0500  Weight: 70.2 kg 69 kg 68.2 kg    Examination:  General exam: No acute distress.  Chronically ill looking female lying in bed.  Currently on room air.   Respiratory system: Decreased breath sounds at bases with no wheezing cardiovascular system: Rate controlled, S1-S2 heard gastrointestinal system: Abdomen is slightly distended, soft and mildly tender  around the left lower quadrant with dressing and drain present.  Normal bowel sounds are heard  extremities: Mild lower extremity edema present.  No clubbing.  Data Reviewed: I have personally reviewed following labs and  imaging studies  CBC: Recent Labs  Lab 10/07/20 0333 10/08/20 0321 10/09/20 0435 10/11/20 0856 10/12/20 0347  WBC 12.3* 11.9* 8.7 11.4* 8.0  HGB 10.1* 10.0* 10.4* 10.9* 9.7*  HCT 31.4* 31.2* 33.0* 34.5* 30.5*  MCV 94.6 96.0 96.5 95.8 96.5  PLT 478* 476* 475* 639* 470*   Basic Metabolic Panel: Recent Labs  Lab 10/06/20 0356 10/11/20 0856 10/12/20 0347  NA 138 141 143  K 3.7 3.2* 3.9  CL 101 99 103  CO2 23 32 31  GLUCOSE 89 111* 107*  BUN 7* 7* 11  CREATININE 0.52 0.48 0.47  CALCIUM 8.0* 8.6* 8.5*   GFR: Estimated Creatinine Clearance: 54.3 mL/min (by C-G formula based on SCr of 0.47 mg/dL). Liver Function Tests: No results for input(s): AST, ALT, ALKPHOS, BILITOT, PROT, ALBUMIN in the last 168 hours. No results for input(s): LIPASE, AMYLASE in the last 168 hours. No results for input(s): AMMONIA in the last 168 hours. Coagulation Profile: No results for input(s): INR, PROTIME in the last 168 hours. Cardiac Enzymes: No results for input(s): CKTOTAL, CKMB, CKMBINDEX, TROPONINI in the last 168 hours. BNP (last 3 results) No results for input(s): PROBNP in the last 8760 hours. HbA1C: No results for input(s): HGBA1C in the last 72 hours. CBG: No results for input(s): GLUCAP in the last 168 hours. Lipid Profile: No results for input(s): CHOL, HDL, LDLCALC, TRIG, CHOLHDL, LDLDIRECT in the last 72 hours. Thyroid Function Tests: No results for input(s): TSH, T4TOTAL, FREET4, T3FREE, THYROIDAB in the last 72 hours. Anemia Panel: No results for input(s): VITAMINB12, FOLATE, FERRITIN, TIBC, IRON, RETICCTPCT in the last 72 hours. Sepsis Labs: No results for input(s): PROCALCITON, LATICACIDVEN in the last 168 hours.  Recent Results (from the past 240 hour(s))  Aerobic/Anaerobic Culture (surgical/deep wound)     Status: None   Collection Time: 10/05/20  4:03 PM   Specimen: Abscess  Result Value Ref Range Status   Specimen Description   Final    ABSCESS ABDOMEN Performed  at [facility] [address] [place], [place] [address]    Special Requests   Final    NONE Performed at [facility], [place] [address] [place], [place] [address]    Gram Stain   Final    ABUNDANT WBC PRESENT,BOTH PMN AND MONONUCLEAR NO ORGANISMS SEEN    Culture   Final    MODERATE STREPTOCOCCUS ANGINOSIS RARE ESCHERICHIA COLI NO ANAEROBES ISOLATED Performed at [facility], [address] [place], [place] [address]    Report Status 10/10/2020 FINAL  Final   Organism ID, Bacteria ESCHERICHIA COLI  Final   Organism ID, Bacteria STREPTOCOCCUS ANGINOSIS  Final      Susceptibility   Escherichia coli - MIC*    AMPICILLIN >=32 RESISTANT Resistant     CEFAZOLIN <=4 SENSITIVE Sensitive     CEFEPIME <=0.12 SENSITIVE Sensitive     CEFTAZIDIME <=1 SENSITIVE Sensitive     CEFTRIAXONE <=0.25 SENSITIVE Sensitive     CIPROFLOXACIN <=0.25 SENSITIVE Sensitive     GENTAMICIN 8 INTERMEDIATE Intermediate     IMIPENEM <=0.25 SENSITIVE Sensitive     TRIMETH/SULFA >=320 RESISTANT Resistant     AMPICILLIN/SULBACTAM >=32 RESISTANT Resistant     PIP/TAZO <=4 SENSITIVE Sensitive     *  RARE ESCHERICHIA COLI   Streptococcus anginosis - MIC*    PENICILLIN 0.12 SENSITIVE Sensitive     CEFTRIAXONE 0.5 SENSITIVE Sensitive     ERYTHROMYCIN <=0.12 SENSITIVE Sensitive     LEVOFLOXACIN 0.5 SENSITIVE Sensitive     VANCOMYCIN 0.5 SENSITIVE Sensitive     * MODERATE STREPTOCOCCUS ANGINOSIS         Radiology Studies: No results found.      Scheduled Meds: . acetaminophen  1,000 mg Oral TID  . amLODipine  5 mg Oral Daily  . aspirin  81 mg Oral Daily  . cephALEXin  500 mg Oral Q8H  . Chlorhexidine Gluconate Cloth  6 each Topical Daily  . enoxaparin (LOVENOX) injection  40 mg Subcutaneous Q24H  . feeding supplement  1 Container Oral TID BM  . hydrocortisone cream   Topical BID  . lip balm  1 application Topical BID  . mouth rinse  15 mL Mouth Rinse  BID  . mirabegron ER  50 mg Oral Daily  . multivitamin with minerals  1 tablet Oral Daily  . pantoprazole  40 mg Oral QHS  . psyllium  1 packet Oral BID  . rosuvastatin  10 mg Oral Daily  . sodium chloride flush  5 mL Intracatheter Q8H   Continuous Infusions:         Aline August, MD [facility] 10/12/2020, 9:29 AM

## 2020-10-12 NOTE — Progress Notes (Signed)
Reviewed Drain care and flushing with patient and husband. Husband demonstrated flush and empty. All questions answered. Verbalized understanding of dressing change. Patient plans on showering once at home, will change after. All other d/c instructions reviewed, patient and husband verbalized understanding. Escorted to pov via w/c.

## 2020-10-12 NOTE — Discharge Summary (Signed)
Center Surgery Discharge Summary   Patient ID: Molly Manning MRN: [id number] DOB/AGE: 11/28/1940 79 y.o.  Admit date: 09/28/2020 Discharge date: 10/12/2020  Admitting Diagnosis: Pneumoperitoneum  Discharge Diagnosis Gangrenous appendicitis with perforation and abscess Post-operative abscess S/p appendectomy Severe protein calorie malnutrition ABL anemia, stable AKI, resolved  Consultants Interventional radiology Internal medicine  Imaging: No results found.  Procedures Dr. Johney Maine (09/28/20) - Laparoscopic Appendectomy, laparoscopic drainage of abscess x2, abdominal washout Dr. Earleen Newport (10/05/20) - Image guided drain placement   Hospital Course:  Patient is a 79 year old female who presented to [facility] with abdominal pain for 2 weeks. Had recently had colonoscopy 09/16/20.  Workup showed Pneumoperitoneum and general surgery called.  Patient was admitted and underwent procedure listed above. Intra-operatively, appendicitis identified as source of pneumoperitoneum. Tolerated procedure well and was transferred to the floor. Patient developed expected post-operative ileus. Patient also had some urinary retention post-op and foley had to be reinserted. Foley was removed 11/26. Bowel function was starting to return 11/26 and diet was advanced slowly as tolerated. Both surgical drains removed 11/28. Persistent leukocytosis noted 11/29 and CT A/P ordered. This showed increase in peritoneal collections and IR was consulted for percutaneous drain placement. Drain placed 11/30 as listed above, cultures grew strep anginosis. Patient completed appropriate course of antibiotics.  On POD#14, the patient was voiding well, tolerating diet, ambulating well, pain well controlled, vital signs stable, incisions c/d/i and felt stable for discharge home.  Patient will follow up in our office in 2-3 weeks and knows to call with questions or concerns.  She will call to confirm appointment date/time.     Physical Exam: General: pleasant, WD, elderly female who is laying in bed in NAD Heart: regular, rate, and rhythm.  Lungs: CTAB, no wheezes, rhonchi, or rales noted.  Respiratory effort nonlabored Abd: soft, mildly ttp in RLQ, ND, +BS, incisions c/d/i, drain with lacteal appearing fluid   I or a member of my team have reviewed this patient in the Controlled Substance Database.   Allergies as of 10/12/2020      Reactions   Fosamax [alendronate] Other (See Comments)   joint pain; (pt does not recall)      Medication List    TAKE these medications   acetaminophen 500 MG tablet Commonly known as: TYLENOL Take 2 tablets (1,000 mg total) by mouth every 8 (eight) hours as needed for mild pain or fever.   aspirin 81 MG chewable tablet Chew 81 mg by mouth daily.   Biotin 1000 MCG tablet Take 1,000 mcg by mouth daily.   Calcium 1000 + D 1000-800 MG-UNIT Tabs Generic drug: Calcium Carb-Cholecalciferol Take 1 tablet by mouth daily.   Centrum Silver tablet Take 1 tablet by mouth daily.   hydrocortisone cream 1 % Apply topically 2 (two) times daily as needed for itching.   Myrbetriq 50 MG Tb24 tablet Generic drug: mirabegron ER Take 50 mg by mouth daily.   ondansetron 4 MG tablet Commonly known as: Zofran Take 1 tablet (4 mg total) by mouth every 8 (eight) hours as needed for nausea or vomiting.   rosuvastatin 10 MG tablet Commonly known as: CRESTOR Take 10 mg by mouth daily.   traMADol 50 MG tablet Commonly known as: ULTRAM Take 1 tablet (50 mg total) by mouth every 6 (six) hours as needed for moderate pain or severe pain.   VITAMIN D PO Take 1 tablet by mouth daily.         Follow-up Information  Michael Boston, MD. Go on 11/15/2020.   Specialty: General Surgery Why: Follow up scheduled for 3:00 PM. Please arrive 15 min prior to appointment time. Bring photo ID and insurance information with you.  Contact information: [place] [address] [phone]        Corrie Mckusick, DO Follow up in 1 week(s).   Specialties: Interventional Radiology, Radiology Contact information: [place] [address] [phone]        Surgery, [place]. Go on 10/28/2020.   Specialty: General Surgery Why: Follow up scheduled for 1:45 PM. Please arrive 30 min prior to appointment time. Bring photo ID and insurance information. Contact information: [place] [address] [phone]        Deland Pretty, MD. Call.   Specialty: Internal Medicine Why: Call and let your primary care doctor know you've been in the hospital.  Contact information: [place] [address] [phone]               Signed: Norm Parcel , [facility] 10/12/2020, 9:47 AM Please see Amion for pager number during day hours 7:00am-4:30pm

## 2020-10-14 ENCOUNTER — Other Ambulatory Visit: Payer: Self-pay | Admitting: Surgery

## 2020-10-14 DIAGNOSIS — K651 Peritoneal abscess: Secondary | ICD-10-CM

## 2020-10-27 ENCOUNTER — Encounter: Payer: Self-pay | Admitting: *Deleted

## 2020-10-27 ENCOUNTER — Ambulatory Visit
Admission: RE | Admit: 2020-10-27 | Discharge: 2020-10-27 | Disposition: A | Discharge status: Home / Self Care | Payer: Medicare Other | Source: Ambulatory Visit | Attending: Surgery | Admitting: Surgery

## 2020-10-27 ENCOUNTER — Ambulatory Visit
Admission: RE | Admit: 2020-10-27 | Discharge: 2020-10-27 | Disposition: A | Discharge status: Home / Self Care | Payer: Medicare Other | Source: Ambulatory Visit | Attending: Radiology | Admitting: Radiology

## 2020-10-27 DIAGNOSIS — K8020 Calculus of gallbladder without cholecystitis without obstruction: Secondary | ICD-10-CM | POA: Diagnosis not present

## 2020-10-27 DIAGNOSIS — K651 Peritoneal abscess: Secondary | ICD-10-CM

## 2020-10-27 DIAGNOSIS — K3533 Acute appendicitis with perforation and localized peritonitis, with abscess: Secondary | ICD-10-CM | POA: Diagnosis not present

## 2020-10-27 DIAGNOSIS — N281 Cyst of kidney, acquired: Secondary | ICD-10-CM | POA: Diagnosis not present

## 2020-10-27 DIAGNOSIS — L02211 Cutaneous abscess of abdominal wall: Secondary | ICD-10-CM | POA: Diagnosis not present

## 2020-10-27 HISTORY — PX: IR RADIOLOGIST EVAL & MGMT: IMG5224

## 2020-10-27 MED ORDER — IOPAMIDOL (ISOVUE-300) INJECTION 61%
100.0000 mL | Freq: Once | INTRAVENOUS | Status: AC | PRN
  Administered 2020-10-27: 100 mL via INTRAVENOUS

## 2020-10-27 NOTE — Progress Notes (Signed)
Referring Physician(s): Allred,Darrell K  Chief Complaint: The patient is seen in follow up today s/p perforated appendicitis  History of present illness:  Molly SitesRebecca Manning is a 79 year old family with history of HLD, overactive bladder who [facility] 09[facility]28/20 septic, with gangrenous, perforated appendicitis.  She underwent lap appendectomy and wash out on 09/28/20.  Post-op she had ongoing leukocytosis and underwent CT Abdomen which showed worsening peritoneal collections.  She then underwent percutaneous drain placement 10/05/20 by Dr. Loreta AveWagner into the largest LUQ collection.  Cultures of fluid aspirated grew E coli, and Strep anginosis. She recovered well with drainage and antibiotics.  She was discharged home with the drain in place. She presents to IR drain clinic today for evaluation and management of her drain.   Molly Manning is assessed at bedside alongside Dr. Lowella DandyHenn. Her LUQ drain is in place without issue.  It is clean and dry.  Her husband has helped her take care of the drain well at home.  She has an output log which shows minimal output of 0-10 mL daily over the past 1 week.  She denies fever, chills, abdominal pain, nausea,vomiting.  Complains of ongoing poor appetite which has been present since her surgery/hospitalization.   No past medical history on file.  Past Surgical History:  Procedure Laterality Date  . LAPAROSCOPIC APPENDECTOMY N/A 09/28/2020   Procedure: DIAGNOSTIC LAPAROSCOPY;  LAPAROSCOPIC DRAINAGE OF ABSESS X 2 LAPAROSCOPIC APPENDECTOMY;  Surgeon: Karie SodaGross, Steven, MD;  Location: [facility] ORS;  Service: General;  Laterality: N/A;    Allergies: Fosamax [alendronate]  Medications: Prior to Admission medications   Medication Sig Start Date End Date Taking? Authorizing Provider  acetaminophen (TYLENOL) 500 MG tablet Take 2 tablets (1,000 mg total) by mouth every 8 (eight) hours as needed for mild pain or fever. 10/12/20   Juliet RudeJohnson, Kelly R, PA-C  aspirin 81 MG chewable  tablet Chew 81 mg by mouth daily.    [provider]  Biotin 1000 MCG tablet Take 1,000 mcg by mouth daily.    [provider]  Calcium Carb-Cholecalciferol (CALCIUM 1000 + D) 1000-800 MG-UNIT TABS Take 1 tablet by mouth daily.    [provider]  hydrocortisone cream 1 % Apply topically 2 (two) times daily as needed for itching. 10/12/20   Juliet RudeJohnson, Kelly R, PA-C  Multiple Vitamins-Minerals (CENTRUM SILVER) tablet Take 1 tablet by mouth daily.    [provider]  MYRBETRIQ 50 MG TB24 tablet Take 50 mg by mouth daily. 09/23/20   [provider]  ondansetron (ZOFRAN) 4 MG tablet Take 1 tablet (4 mg total) by mouth every 8 (eight) hours as needed for nausea or vomiting. 10/12/20 10/12/21  Juliet RudeJohnson, Kelly R, PA-C  rosuvastatin (CRESTOR) 10 MG tablet Take 10 mg by mouth daily. 08/22/20   [provider]  traMADol (ULTRAM) 50 MG tablet Take 1 tablet (50 mg total) by mouth every 6 (six) hours as needed for moderate pain or severe pain. 10/12/20   Juliet RudeJohnson, Kelly R, PA-C  VITAMIN D PO Take 1 tablet by mouth daily.    [provider]     No family history on file.  Social History   Socioeconomic History  . Marital status: Married    Spouse name: Not on file  . Number of children: Not on file  . Years of education: Not on file  . Highest education level: Not on file  Occupational History  . Not on file  Tobacco Use  . Smoking status:  Former Smoker    Packs/day: 0.75    Years: 40.00    Pack years: 30.00    Types: Cigarettes    Quit date: 2002    Years since quitting: 19.9  . Smokeless tobacco: Never Used  Vaping Use  . Vaping Use: Never used  Substance and Sexual Activity  . Alcohol use: Not on file  . Drug use: Not on file  . Sexual activity: Not on file  Other Topics Concern  . Not on file  Social History Narrative  . Not on file   Social Determinants of Health   Financial Resource Strain: Not on file  Food Insecurity: Not  on file  Transportation Needs: Not on file  Physical Activity: Not on file  Stress: Not on file  Social Connections: Not on file     Vital Signs: There were no vitals taken for this visit.  Physical Exam  NAD, alert Abdomen: soft, non-tender.  LUQ drain in place.  Insertion site c/d/i.Marland Kitchen  Minimal output in tubing, no output collection in bulb.  It does have charge.   Imaging: No results found.  Labs:  CBC: Recent Labs    10/08/20 0321 10/09/20 0435 10/11/20 0856 10/12/20 0347  WBC 11.9* 8.7 11.4* 8.0  HGB 10.0* 10.4* 10.9* 9.7*  HCT 31.2* 33.0* 34.5* 30.5*  PLT 476* 475* 639* 539*    COAGS: No results for input(s): INR, APTT in the last 8760 hours.  BMP: Recent Labs    10/05/20 0355 10/06/20 0356 10/11/20 0856 10/12/20 0347  NA 140 138 141 143  K 3.7 3.7 3.2* 3.9  CL 101 101 99 103  CO2 26 23 32 31  GLUCOSE 80 89 111* 107*  BUN 9 7* 7* 11  CALCIUM 8.1* 8.0* 8.6* 8.5*  CREATININE 0.57 0.52 0.48 0.47  GFRNONAA >60 >60 >60 >60    LIVER FUNCTION TESTS: Recent Labs    10/02/20 0249 10/03/20 0421 10/04/20 0233 10/05/20 0355  BILITOT 0.9 1.2 1.0 1.3*  AST 31 27 22 23   ALT 33 25 19 18   ALKPHOS 114 97 85 100  PROT 5.6* 5.6* 5.5* 5.7*  ALBUMIN 2.0* 2.0* 1.9* 2.0*    Assessment: Intra-abdominal fluid collection s/p lap appendectomy and abdominal wash out 09/28/20 S/p percutaneous LUQ drain placement 10/05/20 by Dr. Earleen Newport for post-op fluid collections Molly Manning presents to IR drain clinic today for follow-up and management of her LUQ drain.  She denies new concerns or complaints.  She has been caring for the drain at home with assistance from her husband.  Output from the drain has been minimal over the past several days.  CT Abdomen Pelvis performed today and reviewed by Dr. Anselm Pancoast who notes resolve of her abscess and near resolution of the other small fluid collections present on her hospital CT.  Considering results of her CT as well as minimal output  from drain, ok to to remove today per Dr. Anselm Pancoast who has reviewed imaging and care plan with the patient today.  LUQ drain removed by this PA in its entirety and without complication.  Patient tolerated procedure well.  Dressing placed.  Care instructions given.  No further follow-up with IR needed at this time.   Signed: Docia Barrier, PA 10/27/2020, 1:11 PM   Please refer to Dr. Dr. Anselm Pancoast attestation of this note for management and plan.

## 2021-01-03 DIAGNOSIS — Z1231 Encounter for screening mammogram for malignant neoplasm of breast: Secondary | ICD-10-CM | POA: Diagnosis not present

## 2021-03-30 DIAGNOSIS — Z23 Encounter for immunization: Secondary | ICD-10-CM | POA: Diagnosis not present

## 2021-06-07 DIAGNOSIS — I2510 Atherosclerotic heart disease of native coronary artery without angina pectoris: Secondary | ICD-10-CM | POA: Diagnosis not present

## 2021-06-07 DIAGNOSIS — R918 Other nonspecific abnormal finding of lung field: Secondary | ICD-10-CM | POA: Diagnosis not present

## 2021-06-07 DIAGNOSIS — I6529 Occlusion and stenosis of unspecified carotid artery: Secondary | ICD-10-CM | POA: Diagnosis not present

## 2021-06-07 DIAGNOSIS — Z0000 Encounter for general adult medical examination without abnormal findings: Secondary | ICD-10-CM | POA: Diagnosis not present

## 2021-06-07 DIAGNOSIS — M810 Age-related osteoporosis without current pathological fracture: Secondary | ICD-10-CM | POA: Diagnosis not present

## 2021-06-07 DIAGNOSIS — E7800 Pure hypercholesterolemia, unspecified: Secondary | ICD-10-CM | POA: Diagnosis not present

## 2021-06-08 DIAGNOSIS — E7800 Pure hypercholesterolemia, unspecified: Secondary | ICD-10-CM | POA: Diagnosis not present

## 2021-06-08 DIAGNOSIS — Z01419 Encounter for gynecological examination (general) (routine) without abnormal findings: Secondary | ICD-10-CM | POA: Diagnosis not present

## 2021-06-08 DIAGNOSIS — Z7982 Long term (current) use of aspirin: Secondary | ICD-10-CM | POA: Diagnosis not present

## 2021-06-08 DIAGNOSIS — E7801 Familial hypercholesterolemia: Secondary | ICD-10-CM | POA: Diagnosis not present

## 2021-07-05 DIAGNOSIS — Z1152 Encounter for screening for COVID-19: Secondary | ICD-10-CM | POA: Diagnosis not present

## 2021-08-30 DIAGNOSIS — Z1152 Encounter for screening for COVID-19: Secondary | ICD-10-CM | POA: Diagnosis not present

## 2021-10-05 DIAGNOSIS — Z1152 Encounter for screening for COVID-19: Secondary | ICD-10-CM | POA: Diagnosis not present

## 2022-01-31 DIAGNOSIS — Z1231 Encounter for screening mammogram for malignant neoplasm of breast: Secondary | ICD-10-CM | POA: Diagnosis not present

## 2022-06-06 DIAGNOSIS — E7801 Familial hypercholesterolemia: Secondary | ICD-10-CM | POA: Diagnosis not present

## 2022-06-06 DIAGNOSIS — I6529 Occlusion and stenosis of unspecified carotid artery: Secondary | ICD-10-CM | POA: Diagnosis not present

## 2022-06-06 DIAGNOSIS — Z7982 Long term (current) use of aspirin: Secondary | ICD-10-CM | POA: Diagnosis not present

## 2022-06-12 DIAGNOSIS — Z1212 Encounter for screening for malignant neoplasm of rectum: Secondary | ICD-10-CM | POA: Diagnosis not present

## 2022-06-12 DIAGNOSIS — Z01419 Encounter for gynecological examination (general) (routine) without abnormal findings: Secondary | ICD-10-CM | POA: Diagnosis not present

## 2022-06-12 DIAGNOSIS — M810 Age-related osteoporosis without current pathological fracture: Secondary | ICD-10-CM | POA: Diagnosis not present

## 2022-06-12 DIAGNOSIS — Z820 Family history of epilepsy and other diseases of the nervous system: Secondary | ICD-10-CM | POA: Diagnosis not present

## 2022-06-16 DIAGNOSIS — Z7982 Long term (current) use of aspirin: Secondary | ICD-10-CM | POA: Diagnosis not present

## 2022-06-16 DIAGNOSIS — Z23 Encounter for immunization: Secondary | ICD-10-CM | POA: Diagnosis not present

## 2022-06-16 DIAGNOSIS — Z0000 Encounter for general adult medical examination without abnormal findings: Secondary | ICD-10-CM | POA: Diagnosis not present

## 2022-06-16 DIAGNOSIS — K449 Diaphragmatic hernia without obstruction or gangrene: Secondary | ICD-10-CM | POA: Diagnosis not present

## 2022-06-16 DIAGNOSIS — H9313 Tinnitus, bilateral: Secondary | ICD-10-CM | POA: Diagnosis not present

## 2022-06-16 DIAGNOSIS — I6529 Occlusion and stenosis of unspecified carotid artery: Secondary | ICD-10-CM | POA: Diagnosis not present

## 2022-06-16 DIAGNOSIS — M810 Age-related osteoporosis without current pathological fracture: Secondary | ICD-10-CM | POA: Diagnosis not present

## 2022-06-16 DIAGNOSIS — E7800 Pure hypercholesterolemia, unspecified: Secondary | ICD-10-CM | POA: Diagnosis not present

## 2023-02-06 DIAGNOSIS — Z1231 Encounter for screening mammogram for malignant neoplasm of breast: Secondary | ICD-10-CM | POA: Diagnosis not present

## 2023-03-19 DIAGNOSIS — H2513 Age-related nuclear cataract, bilateral: Secondary | ICD-10-CM | POA: Diagnosis not present

## 2023-06-19 DIAGNOSIS — M810 Age-related osteoporosis without current pathological fracture: Secondary | ICD-10-CM | POA: Diagnosis not present

## 2023-06-19 DIAGNOSIS — E7800 Pure hypercholesterolemia, unspecified: Secondary | ICD-10-CM | POA: Diagnosis not present

## 2023-06-22 DIAGNOSIS — J438 Other emphysema: Secondary | ICD-10-CM | POA: Diagnosis not present

## 2023-06-22 DIAGNOSIS — L309 Dermatitis, unspecified: Secondary | ICD-10-CM | POA: Diagnosis not present

## 2023-06-22 DIAGNOSIS — R918 Other nonspecific abnormal finding of lung field: Secondary | ICD-10-CM | POA: Diagnosis not present

## 2023-06-22 DIAGNOSIS — R3129 Other microscopic hematuria: Secondary | ICD-10-CM | POA: Diagnosis not present

## 2023-06-22 DIAGNOSIS — I2510 Atherosclerotic heart disease of native coronary artery without angina pectoris: Secondary | ICD-10-CM | POA: Diagnosis not present

## 2023-06-22 DIAGNOSIS — I6529 Occlusion and stenosis of unspecified carotid artery: Secondary | ICD-10-CM | POA: Diagnosis not present

## 2023-06-22 DIAGNOSIS — Z789 Other specified health status: Secondary | ICD-10-CM | POA: Diagnosis not present

## 2023-06-22 DIAGNOSIS — M810 Age-related osteoporosis without current pathological fracture: Secondary | ICD-10-CM | POA: Diagnosis not present

## 2023-06-22 DIAGNOSIS — Z0000 Encounter for general adult medical examination without abnormal findings: Secondary | ICD-10-CM | POA: Diagnosis not present

## 2023-06-27 DIAGNOSIS — M810 Age-related osteoporosis without current pathological fracture: Secondary | ICD-10-CM | POA: Diagnosis not present

## 2023-07-31 DIAGNOSIS — M810 Age-related osteoporosis without current pathological fracture: Secondary | ICD-10-CM | POA: Diagnosis not present

## 2023-07-31 DIAGNOSIS — E7800 Pure hypercholesterolemia, unspecified: Secondary | ICD-10-CM | POA: Diagnosis not present

## 2023-08-01 DIAGNOSIS — M810 Age-related osteoporosis without current pathological fracture: Secondary | ICD-10-CM | POA: Diagnosis not present

## 2023-08-01 DIAGNOSIS — I2510 Atherosclerotic heart disease of native coronary artery without angina pectoris: Secondary | ICD-10-CM | POA: Diagnosis not present

## 2023-08-05 DIAGNOSIS — Z23 Encounter for immunization: Secondary | ICD-10-CM | POA: Diagnosis not present

## 2023-12-31 DIAGNOSIS — M810 Age-related osteoporosis without current pathological fracture: Secondary | ICD-10-CM | POA: Diagnosis not present

## 2024-02-19 DIAGNOSIS — Z1231 Encounter for screening mammogram for malignant neoplasm of breast: Secondary | ICD-10-CM | POA: Diagnosis not present

## 2024-02-22 DIAGNOSIS — N6311 Unspecified lump in the right breast, upper outer quadrant: Secondary | ICD-10-CM | POA: Diagnosis not present

## 2024-02-22 DIAGNOSIS — R922 Inconclusive mammogram: Secondary | ICD-10-CM | POA: Diagnosis not present

## 2024-05-08 DIAGNOSIS — H52223 Regular astigmatism, bilateral: Secondary | ICD-10-CM | POA: Diagnosis not present

## 2024-05-08 DIAGNOSIS — H2513 Age-related nuclear cataract, bilateral: Secondary | ICD-10-CM | POA: Diagnosis not present

## 2024-05-08 DIAGNOSIS — H43813 Vitreous degeneration, bilateral: Secondary | ICD-10-CM | POA: Diagnosis not present

## 2024-05-08 DIAGNOSIS — H5203 Hypermetropia, bilateral: Secondary | ICD-10-CM | POA: Diagnosis not present

## 2024-05-08 DIAGNOSIS — H3562 Retinal hemorrhage, left eye: Secondary | ICD-10-CM | POA: Diagnosis not present

## 2024-05-08 DIAGNOSIS — H524 Presbyopia: Secondary | ICD-10-CM | POA: Diagnosis not present

## 2024-05-26 DIAGNOSIS — H2513 Age-related nuclear cataract, bilateral: Secondary | ICD-10-CM | POA: Diagnosis not present

## 2024-05-26 DIAGNOSIS — H25013 Cortical age-related cataract, bilateral: Secondary | ICD-10-CM | POA: Diagnosis not present

## 2024-05-26 DIAGNOSIS — H25043 Posterior subcapsular polar age-related cataract, bilateral: Secondary | ICD-10-CM | POA: Diagnosis not present

## 2024-05-26 DIAGNOSIS — H40013 Open angle with borderline findings, low risk, bilateral: Secondary | ICD-10-CM | POA: Diagnosis not present

## 2024-05-26 DIAGNOSIS — H2511 Age-related nuclear cataract, right eye: Secondary | ICD-10-CM | POA: Diagnosis not present

## 2024-06-20 DIAGNOSIS — I6529 Occlusion and stenosis of unspecified carotid artery: Secondary | ICD-10-CM | POA: Diagnosis not present

## 2024-06-20 DIAGNOSIS — E7800 Pure hypercholesterolemia, unspecified: Secondary | ICD-10-CM | POA: Diagnosis not present

## 2024-06-23 DIAGNOSIS — E7800 Pure hypercholesterolemia, unspecified: Secondary | ICD-10-CM | POA: Diagnosis not present

## 2024-06-26 DIAGNOSIS — Z0000 Encounter for general adult medical examination without abnormal findings: Secondary | ICD-10-CM | POA: Diagnosis not present

## 2024-06-26 DIAGNOSIS — I6529 Occlusion and stenosis of unspecified carotid artery: Secondary | ICD-10-CM | POA: Diagnosis not present

## 2024-06-26 DIAGNOSIS — J438 Other emphysema: Secondary | ICD-10-CM | POA: Diagnosis not present

## 2024-06-26 DIAGNOSIS — R3129 Other microscopic hematuria: Secondary | ICD-10-CM | POA: Diagnosis not present

## 2024-06-26 DIAGNOSIS — K449 Diaphragmatic hernia without obstruction or gangrene: Secondary | ICD-10-CM | POA: Diagnosis not present

## 2024-06-26 DIAGNOSIS — R918 Other nonspecific abnormal finding of lung field: Secondary | ICD-10-CM | POA: Diagnosis not present

## 2024-06-26 DIAGNOSIS — E7800 Pure hypercholesterolemia, unspecified: Secondary | ICD-10-CM | POA: Diagnosis not present

## 2024-06-26 DIAGNOSIS — M810 Age-related osteoporosis without current pathological fracture: Secondary | ICD-10-CM | POA: Diagnosis not present

## 2024-06-30 DIAGNOSIS — M810 Age-related osteoporosis without current pathological fracture: Secondary | ICD-10-CM | POA: Diagnosis not present

## 2024-07-21 DIAGNOSIS — D225 Melanocytic nevi of trunk: Secondary | ICD-10-CM | POA: Diagnosis not present

## 2024-07-21 DIAGNOSIS — L570 Actinic keratosis: Secondary | ICD-10-CM | POA: Diagnosis not present

## 2024-07-21 DIAGNOSIS — L821 Other seborrheic keratosis: Secondary | ICD-10-CM | POA: Diagnosis not present

## 2024-07-21 DIAGNOSIS — X32XXXA Exposure to sunlight, initial encounter: Secondary | ICD-10-CM | POA: Diagnosis not present

## 2024-07-21 DIAGNOSIS — Z1283 Encounter for screening for malignant neoplasm of skin: Secondary | ICD-10-CM | POA: Diagnosis not present

## 2024-07-29 DIAGNOSIS — Z23 Encounter for immunization: Secondary | ICD-10-CM | POA: Diagnosis not present

## 2024-08-21 DIAGNOSIS — H2511 Age-related nuclear cataract, right eye: Secondary | ICD-10-CM | POA: Diagnosis not present

## 2024-08-21 DIAGNOSIS — H52201 Unspecified astigmatism, right eye: Secondary | ICD-10-CM | POA: Diagnosis not present

## 2024-08-22 DIAGNOSIS — H25012 Cortical age-related cataract, left eye: Secondary | ICD-10-CM | POA: Diagnosis not present

## 2024-08-22 DIAGNOSIS — H2512 Age-related nuclear cataract, left eye: Secondary | ICD-10-CM | POA: Diagnosis not present

## 2024-08-22 DIAGNOSIS — H25042 Posterior subcapsular polar age-related cataract, left eye: Secondary | ICD-10-CM | POA: Diagnosis not present

## 2024-08-26 DIAGNOSIS — R928 Other abnormal and inconclusive findings on diagnostic imaging of breast: Secondary | ICD-10-CM | POA: Diagnosis not present

## 2024-08-26 DIAGNOSIS — N6315 Unspecified lump in the right breast, overlapping quadrants: Secondary | ICD-10-CM | POA: Diagnosis not present

## 2024-09-11 DIAGNOSIS — H2512 Age-related nuclear cataract, left eye: Secondary | ICD-10-CM | POA: Diagnosis not present
